# Patient Record
Sex: Female | Born: 1940 | Race: White | Hispanic: No | Smoking: Never smoker
Health system: Southern US, Community
[De-identification: ages and names within clinical notes are randomized; demographics above are authoritative.]

## PROBLEM LIST (undated history)

## (undated) DIAGNOSIS — G709 Myoneural disorder, unspecified: Secondary | ICD-10-CM

## (undated) DIAGNOSIS — Z87440 Personal history of urinary (tract) infections: Secondary | ICD-10-CM

## (undated) DIAGNOSIS — H269 Unspecified cataract: Secondary | ICD-10-CM

## (undated) DIAGNOSIS — Z9889 Other specified postprocedural states: Secondary | ICD-10-CM

## (undated) DIAGNOSIS — I10 Essential (primary) hypertension: Secondary | ICD-10-CM

## (undated) DIAGNOSIS — Z8639 Personal history of other endocrine, nutritional and metabolic disease: Secondary | ICD-10-CM

## (undated) DIAGNOSIS — Z889 Allergy status to unspecified drugs, medicaments and biological substances status: Secondary | ICD-10-CM

## (undated) DIAGNOSIS — M1990 Unspecified osteoarthritis, unspecified site: Secondary | ICD-10-CM

## (undated) DIAGNOSIS — E785 Hyperlipidemia, unspecified: Secondary | ICD-10-CM

## (undated) DIAGNOSIS — Z9289 Personal history of other medical treatment: Secondary | ICD-10-CM

## (undated) DIAGNOSIS — K219 Gastro-esophageal reflux disease without esophagitis: Secondary | ICD-10-CM

## (undated) DIAGNOSIS — R112 Nausea with vomiting, unspecified: Secondary | ICD-10-CM

## (undated) DIAGNOSIS — T7840XA Allergy, unspecified, initial encounter: Secondary | ICD-10-CM

## (undated) DIAGNOSIS — Z87898 Personal history of other specified conditions: Secondary | ICD-10-CM

## (undated) HISTORY — DX: Allergy status to unspecified drugs, medicaments and biological substances: Z88.9

## (undated) HISTORY — DX: Unspecified cataract: H26.9

## (undated) HISTORY — DX: Myoneural disorder, unspecified: G70.9

## (undated) HISTORY — DX: Personal history of other endocrine, nutritional and metabolic disease: Z86.39

## (undated) HISTORY — DX: Essential (primary) hypertension: I10

## (undated) HISTORY — DX: Unspecified osteoarthritis, unspecified site: M19.90

## (undated) HISTORY — PX: TUBAL LIGATION: SHX77

## (undated) HISTORY — PX: MICRODISCECTOMY LUMBAR: SUR864

## (undated) HISTORY — DX: Gastro-esophageal reflux disease without esophagitis: K21.9

## (undated) HISTORY — PX: CARPAL TUNNEL RELEASE: SHX101

## (undated) HISTORY — PX: CATARACT EXTRACTION, BILATERAL: SHX1313

## (undated) HISTORY — PX: SPINE SURGERY: SHX786

## (undated) HISTORY — DX: Personal history of urinary (tract) infections: Z87.440

## (undated) HISTORY — PX: EYE SURGERY: SHX253

## (undated) HISTORY — DX: Hyperlipidemia, unspecified: E78.5

## (undated) HISTORY — DX: Personal history of other medical treatment: Z92.89

## (undated) HISTORY — PX: JOINT REPLACEMENT: SHX530

## (undated) HISTORY — DX: Allergy, unspecified, initial encounter: T78.40XA

## (undated) HISTORY — DX: Personal history of other specified conditions: Z87.898

## (undated) HISTORY — PX: REMOVAL OF URINARY SLING: SHX6218

---

## 2013-07-15 DIAGNOSIS — Z85828 Personal history of other malignant neoplasm of skin: Secondary | ICD-10-CM

## 2013-07-15 HISTORY — DX: Personal history of other malignant neoplasm of skin: Z85.828

## 2015-07-16 DIAGNOSIS — Z85828 Personal history of other malignant neoplasm of skin: Secondary | ICD-10-CM

## 2015-07-16 HISTORY — DX: Personal history of other malignant neoplasm of skin: Z85.828

## 2017-07-15 HISTORY — PX: MICRODISCECTOMY LUMBAR: SUR864

## 2019-03-26 LAB — HM MAMMOGRAPHY

## 2019-07-26 ENCOUNTER — Ambulatory Visit: Payer: Medicare HMO | Admitting: Family Medicine

## 2019-07-26 ENCOUNTER — Encounter: Payer: Self-pay | Admitting: Family Medicine

## 2019-07-26 ENCOUNTER — Other Ambulatory Visit: Payer: Self-pay

## 2019-07-26 VITALS — BP 126/80 | HR 71 | Temp 97.5°F | Ht 63.0 in | Wt 181.4 lb

## 2019-07-26 DIAGNOSIS — E669 Obesity, unspecified: Secondary | ICD-10-CM | POA: Insufficient documentation

## 2019-07-26 DIAGNOSIS — G2581 Restless legs syndrome: Secondary | ICD-10-CM | POA: Insufficient documentation

## 2019-07-26 DIAGNOSIS — Z636 Dependent relative needing care at home: Secondary | ICD-10-CM | POA: Insufficient documentation

## 2019-07-26 DIAGNOSIS — K635 Polyp of colon: Secondary | ICD-10-CM | POA: Insufficient documentation

## 2019-07-26 DIAGNOSIS — I10 Essential (primary) hypertension: Secondary | ICD-10-CM | POA: Diagnosis not present

## 2019-07-26 DIAGNOSIS — K219 Gastro-esophageal reflux disease without esophagitis: Secondary | ICD-10-CM | POA: Insufficient documentation

## 2019-07-26 DIAGNOSIS — D352 Benign neoplasm of pituitary gland: Secondary | ICD-10-CM | POA: Diagnosis not present

## 2019-07-26 DIAGNOSIS — E7800 Pure hypercholesterolemia, unspecified: Secondary | ICD-10-CM | POA: Diagnosis not present

## 2019-07-26 DIAGNOSIS — D126 Benign neoplasm of colon, unspecified: Secondary | ICD-10-CM

## 2019-07-26 DIAGNOSIS — E785 Hyperlipidemia, unspecified: Secondary | ICD-10-CM | POA: Insufficient documentation

## 2019-07-26 DIAGNOSIS — R7989 Other specified abnormal findings of blood chemistry: Secondary | ICD-10-CM | POA: Insufficient documentation

## 2019-07-26 DIAGNOSIS — Z9889 Other specified postprocedural states: Secondary | ICD-10-CM | POA: Insufficient documentation

## 2019-07-26 DIAGNOSIS — J301 Allergic rhinitis due to pollen: Secondary | ICD-10-CM

## 2019-07-26 DIAGNOSIS — N3941 Urge incontinence: Secondary | ICD-10-CM | POA: Insufficient documentation

## 2019-07-26 DIAGNOSIS — Z85828 Personal history of other malignant neoplasm of skin: Secondary | ICD-10-CM

## 2019-07-26 DIAGNOSIS — J309 Allergic rhinitis, unspecified: Secondary | ICD-10-CM | POA: Insufficient documentation

## 2019-07-26 DIAGNOSIS — M8580 Other specified disorders of bone density and structure, unspecified site: Secondary | ICD-10-CM

## 2019-07-26 NOTE — Assessment & Plan Note (Signed)
Pt had ? Bladder suspension years ago  Is interested in discussing further options with a uro-gyn specialist She will call us when ready for a referral

## 2019-07-26 NOTE — Assessment & Plan Note (Signed)
Seasonal /environmental  Takes flonase when needed

## 2019-07-26 NOTE — Assessment & Plan Note (Signed)
In the past  Last colonoscopy 2014 - is overdue for 5 y recall  She is not ready to schedule that and will get back to [place]  Sent for last report  No bowel change per pt

## 2019-07-26 NOTE — Assessment & Plan Note (Signed)
Well controlled with generic protonix  Enc to eat a healthy diet

## 2019-07-26 NOTE — Assessment & Plan Note (Signed)
Discussed how this problem influences overall health and the risks it imposes  Reviewed plan for weight loss with lower calorie diet (via better food choices and also portion control or program like weight watchers) and exercise building up to or more than 30 minutes 5 days per week including some aerobic activity    

## 2019-07-26 NOTE — Assessment & Plan Note (Signed)
Sent for last lipid profile Takes crestor 10 mg daily  Makes effort re: low trans fat diet

## 2019-07-26 NOTE — Progress Notes (Signed)
Subjective:    Patient ID: Nicole Suarez, female    DOB: June 12, 1941, 79 y.o.   MRN: [id number]  This visit occurred during the SARS-CoV-2 public health emergency.  Safety protocols were in place, including screening questions prior to the visit, additional usage of staff PPE, and extensive cleaning of exam room while observing appropriate contact time as indicated for disinfecting solutions.    HPI New pt presents to establish care  She is Dr Nonda Lou mother- who moved to the area  Has a townhouse here -in [place] to move here permanently  Still has apartment in [place]  Has to care for her husband - he does not drive and has memory problems   She is a retired Therapist, sports    Husband is rehab from surgery- wants to be able to walk more   Wt Readings from Last 3 Encounters:  07/26/19 181 lb 6 oz (82.3 kg)   32.13 kg/m    Medical history:   Arthritis Has had total knee replacement on the L  Also rotator cuff tear -both shoulders   Deg disc disease Had discectomy 2/20 of L3-4   Uterine suspension surg 1971 No hysterectomy    GERD- takes generic of protonix   Allergies : - to pollen    HTN:  bp is stable today  No cp or palpitations or headaches or edema  No side effects to medicines  BP Readings from Last 3 Encounters:  07/26/19 126/80     Pulse Readings from Last 3 Encounters:  07/26/19 71     Hyperlipidemia - mild  Taking crestor 10 mg  Last lab work done in [place]   Urine incontinence-ongoing (urge incont) Was suggested she see a uro gyn   (daughter gave her a name)   Had uti recently -tx with bactrim  She drinks lots of water  One coffee per day   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 Microadenoma of pituitary) Watches out for that  On parlodel   RLS- intol of requip (affected vision)   Family history:  Cancer  Father had lung cancer - died 60  -heavy smoker  Mother died in child birth with her  Oldest  sister-pancreatic cancer (also heavy etoh)  53 brother had heart issues  Half sister had bladder cancer    Health mt  Had physical in Sept . Colonoscopy 2014-supposed to have every 5 years (will consider this spring)  Had a polyp years ago   Tetanus shot 2014 Pap was last 2018 - gyn  G4P3  LMP [place] in 9/20 -normal  Self breast exam -no lumps   Had both prevnar and pna 25  Td 7/14  Flu shot 2020-this season  dexa 9/19 - ostepenia  Had the shingrix series   Has eye exam next mo (has cataracts recently)   Health habits : Uses a stationary bike  Likes to walk  Takes ca and D for her bones   Has had Moh's procedure on face for skin ca  ? What type -perhaps squamous   Patient Active Problem List   Diagnosis Date Noted  . GERD (gastroesophageal reflux disease) 07/26/2019  . Essential hypertension 07/26/2019  . Hyperlipidemia 07/26/2019  . Allergic rhinitis 07/26/2019  . Caregiver stress 07/26/2019  . Urge incontinence 07/26/2019  . 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 RLS (restless legs syndrome) 07/26/2019  . Colon polyp 07/26/2019  . Osteopenia 07/26/2019  . History  of Mohs micrographic surgery for skin cancer 07/26/2019  . Obesity (BMI 30-39.9) 07/26/2019   Past Medical History:  Diagnosis Date  . Arthritis   . GERD (gastroesophageal reflux disease)   . History of allergy   . History of blood transfusion   . 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 History of urinary incontinence   . History of UTI     Social History   Tobacco Use  . Smoking status: Never Smoker  . Smokeless tobacco: Never Used  Substance Use Topics  . Alcohol use: Never  . Drug use: Not Currently   Family History  Problem Relation Age of Onset  . Early death Mother   . Cancer Father   . COPD Father   . Lung cancer Father   . Pancreatic cancer Sister   . Alcohol abuse Brother   . Heart disease Brother   .  Hypertension Brother   . COPD Maternal Grandmother    Allergies  Allergen Reactions  . Ace Inhibitors Other (See Comments)    THROAT SWELLING  . Requip [Ropinirole]     Vision change   . Shrimp [Shellfish Allergy] Nausea And Vomiting  . Aleve [Naproxen] Rash   Current Outpatient Medications on File Prior to Visit  Medication Sig Dispense Refill  . bromocriptine (PARLODEL) 2.5 MG tablet Take 2.5 mg by mouth daily.    . Calcium Carbonate-Vit D-Min (CALCIUM 1200 PO) Take 1 capsule by mouth daily.    . carvedilol (COREG) 12.5 MG tablet Take 12.5 mg by mouth 2 (two) times daily with a meal.    . chlorthalidone (HYGROTON) 25 MG tablet Take 12.5 mg by mouth daily.    . fluticasone (FLONASE) 50 MCG/ACT nasal spray Place 1-2 sprays into both nostrils daily.    Marland Kitchen ibuprofen (ADVIL) 400 MG tablet Take 400 mg by mouth every 8 (eight) hours as needed.    . Multiple Vitamin (MULTIVITAMIN) capsule Take 1 capsule by mouth daily.    . Multiple Vitamins-Minerals (PRESERVISION AREDS 2 PO) Take 2 capsules by mouth daily.    . NON FORMULARY SEVINA: 2 tablets daily    . Pantoprazole Sodium (PROTONIX PO) Take 40 mg by mouth daily.    . rosuvastatin (CRESTOR) 10 MG tablet Take 10 mg by mouth at bedtime.    Marland Kitchen VITAMIN D PO Take 1 capsule by mouth daily.     No current facility-administered medications on file prior to visit.     Review of Systems  Constitutional: Negative for activity change, appetite change, fatigue, fever and unexpected weight change.  HENT: Negative for congestion, ear pain, rhinorrhea, sinus pressure and sore throat.   Eyes: Negative for pain, redness and visual disturbance.  Respiratory: Negative for cough, shortness of breath and wheezing.   Cardiovascular: Negative for chest pain and palpitations.  Gastrointestinal: Negative for abdominal pain, blood in stool, constipation and diarrhea.  Endocrine: Negative for polydipsia and polyuria.  Genitourinary: Negative for dysuria,  frequency and urgency.  Musculoskeletal: Positive for back pain. Negative for arthralgias and myalgias.  Skin: Negative for pallor and rash.  Allergic/Immunologic: Negative for environmental allergies.  Neurological: Negative for dizziness, syncope and headaches.       Restless leg syndrome   Hematological: Negative for adenopathy. Does not bruise/bleed easily.  Psychiatric/Behavioral: Negative for decreased concentration and dysphoric mood. The patient is not nervous/anxious.        Caregiver stress- husband        Objective:   Physical  Exam Constitutional:      General: She is not in acute distress.    Appearance: Normal appearance. She is well-developed. She is obese. She is not ill-appearing or diaphoretic.  HENT:     Head: Normocephalic and atraumatic.     Right Ear: Tympanic membrane and ear canal normal.     Left Ear: Tympanic membrane and ear canal normal.     Mouth/Throat:     Mouth: Mucous membranes are moist.  Eyes:     General: No scleral icterus.    Conjunctiva/sclera: Conjunctivae normal.     Pupils: Pupils are equal, round, and reactive to light.  Neck:     Thyroid: No thyromegaly.     Vascular: No carotid bruit or JVD.  Cardiovascular:     Rate and Rhythm: Normal rate and regular rhythm.     Pulses: Normal pulses.     Heart sounds: Normal heart sounds. No gallop.   Pulmonary:     Effort: Pulmonary effort is normal. No respiratory distress.     Breath sounds: Normal breath sounds. No wheezing or rales.  Abdominal:     General: Bowel sounds are normal. There is no distension or abdominal bruit.     Palpations: Abdomen is soft. There is no mass.     Tenderness: There is no abdominal tenderness.  Musculoskeletal:     Cervical back: Normal range of motion and neck supple. No tenderness.     Right lower leg: No edema.     Left lower leg: No edema.  Lymphadenopathy:     Cervical: No cervical adenopathy.  Skin:    General: Skin is warm and dry.     Coloration:  Skin is not pale.     Findings: No erythema or rash.     Comments: Some lentigines and sks  Neurological:     Mental Status: She is alert.     Cranial Nerves: No cranial nerve deficit.     Motor: No weakness.     Coordination: Coordination normal.     Gait: Gait normal.     Deep Tendon Reflexes: Reflexes are normal and symmetric. Reflexes normal.  Psychiatric:        Mood and Affect: Mood normal.        Cognition and Memory: Cognition and memory normal.           Assessment & Plan:   Problem List Items Addressed This Visit      Cardiovascular and Mediastinum   Essential hypertension - Primary    bp in fair control at this time  BP Readings from Last 1 Encounters:  07/26/19 126/80   No changes needed Most recent labs reviewed  Disc lifstyle change with low sodium diet and exercise  Taking chlorthalidone and carvedilol  Will rev last labs when we get them        Relevant Medications   carvedilol (COREG) 12.5 MG tablet   rosuvastatin (CRESTOR) 10 MG tablet   chlorthalidone (HYGROTON) 25 MG tablet     Respiratory   Allergic rhinitis    Seasonal /environmental  Takes flonase when needed        Digestive   GERD (gastroesophageal reflux disease)    Well controlled with generic protonix  Enc to eat a healthy diet       Relevant Medications   Pantoprazole Sodium (PROTONIX PO)   Colon polyp    In the past  Last colonoscopy 2014 - is overdue for 5 y recall  She is  not ready to schedule that and will get back to [place]  Sent for last report  No bowel change per pt         Endocrine   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)  Takes parlodel 2.5 mg daily and no problems  Last lab was in sept in [place]      RLS (restless legs syndrome)    Pt tried requip and had side eff of vision change Living with her symptoms now       History of Mohs micrographic surgery for skin cancer    Pt thinks it was squamous cell lesion  She watches sun exposure carefully now      Obesity (BMI 30-39.9)    Discussed how this problem influences overall health and the risks it imposes  Reviewed plan for weight loss with lower calorie diet (via better food choices and also portion control or program like weight watchers) and exercise building up to or more than 30 minutes 5 days per week including some aerobic activity

## 2019-07-26 NOTE — Assessment & Plan Note (Signed)
bp in fair control at this time  BP Readings from Last 1 Encounters:  07/26/19 126/80   No changes needed Most recent labs reviewed  Disc lifstyle change with low sodium diet and exercise  Taking chlorthalidone and carvedilol  Will rev last labs when we get them

## 2019-07-26 NOTE — Assessment & Plan Note (Signed)
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

## 2019-07-26 NOTE — Assessment & Plan Note (Signed)
Pt tried requip and had side eff of vision change Living with her symptoms now

## 2019-07-26 NOTE — Assessment & Plan Note (Signed)
Pt thinks it was squamous cell lesion  She watches sun exposure carefully now

## 2019-07-26 NOTE — Assessment & Plan Note (Signed)
Cares for disabled husband  She wishes she had more support from him and feels frustrated at times

## 2019-07-26 NOTE — Patient Instructions (Addendum)
Please bring in copies of dexa/ mammogram and colonoscopy  Take care of yourself and be careful   Let us know if you find a uro-gyn and a referral  Let us know when you want to schedule your colonoscopy   We will send for note/labs/immunization records

## 2019-07-26 NOTE — Assessment & Plan Note (Signed)
Sent for last dexa  No falls or fx  Counseled on adequate intake of vit D and ca as well as exercise

## 2019-11-01 ENCOUNTER — Encounter: Payer: Self-pay | Admitting: Family Medicine

## 2019-11-02 ENCOUNTER — Other Ambulatory Visit: Payer: Self-pay

## 2019-11-02 ENCOUNTER — Ambulatory Visit: Payer: Medicare HMO | Admitting: Family Medicine

## 2019-11-02 ENCOUNTER — Encounter: Payer: Self-pay | Admitting: Family Medicine

## 2019-11-02 VITALS — BP 146/88 | HR 89 | Temp 96.9°F | Ht 63.0 in | Wt 187.1 lb

## 2019-11-02 DIAGNOSIS — N3000 Acute cystitis without hematuria: Secondary | ICD-10-CM | POA: Diagnosis not present

## 2019-11-02 DIAGNOSIS — R350 Frequency of micturition: Secondary | ICD-10-CM | POA: Diagnosis not present

## 2019-11-02 LAB — POC URINALSYSI DIPSTICK (AUTOMATED)
Bilirubin, UA: NEGATIVE
Blood, UA: 80
Glucose, UA: NEGATIVE
Ketones, UA: NEGATIVE
Nitrite, UA: POSITIVE
Protein, UA: NEGATIVE
Spec Grav, UA: 1.02 (ref 1.010–1.025)
Urobilinogen, UA: 0.2 E.U./dL
pH, UA: 6 (ref 5.0–8.0)

## 2019-11-02 MED ORDER — CEPHALEXIN 500 MG PO CAPS: 500.0000 mg | ORAL_CAPSULE | Freq: Two times a day (BID) | ORAL | 0 refills | Status: DC

## 2019-11-02 NOTE — Assessment & Plan Note (Signed)
With urgency/freq and more incontinence than usual  Enc water intake Pos ua-cx pend  tx with keflex 500 mg bid  inst to alert if suddenly worsens  Will call to check in when cx returns Handout given

## 2019-11-02 NOTE — Patient Instructions (Addendum)
I think you have a uti  Continue to drink lots of water Take the generic keflex as directed   If symptoms suddenly worsen - please call   We will contact you when urine culture returns

## 2019-11-02 NOTE — Progress Notes (Signed)
Subjective:    Patient ID: Nicole Suarez, female    DOB: February 05, 1941, 79 y.o.   MRN: [id number]  This visit occurred during the SARS-CoV-2 public health emergency.  Safety protocols were in place, including screening questions prior to the visit, additional usage of staff PPE, and extensive cleaning of exam room while observing appropriate contact time as indicated for disinfecting solutions.    HPI Pt presents with urinary symptoms   Wt Readings from Last 3 Encounters:  11/02/19 187 lb 1 oz (84.9 kg)  07/26/19 181 lb 6 oz (82.3 kg)   33.14 kg/m   Pt has a h/o urge incontinence   Thinks she has a uti  More incontinence and frequency of urination for the past several days  Going through a lot of pads   No discomfort to urinate  Some discomfort in bladder area  No blood in urine   No odor  No fever  occ nausea- ? 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 Acute cystitis 11/02/2019  . GERD (gastroesophageal reflux disease) 07/26/2019  . Essential hypertension 07/26/2019  . Hyperlipidemia 07/26/2019  . Allergic rhinitis 07/26/2019  . Caregiver stress 07/26/2019  . Urge incontinence 07/26/2019  . 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 RLS (restless legs syndrome) 07/26/2019  . Colon polyp 07/26/2019  . Osteopenia 07/26/2019  . History  of Mohs micrographic surgery for skin cancer 07/26/2019  . Obesity (BMI 30-39.9) 07/26/2019   Past Medical History:  Diagnosis Date  . Arthritis   . GERD (gastroesophageal reflux disease)   . History of allergy   . History of blood transfusion   . 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 History of urinary incontinence   . History of UTI    Past Surgical History:  Procedure Laterality Date  . APPENDECTOMY  1963  . [place]  . RECTOCELE REPAIR  2009  . REPLACEMENT TOTAL KNEE  2010  . ROTATOR CUFF REPAIR  2000  . urterine suspension  1971   Social History   Tobacco Use  . Smoking status: Never Smoker  . Smokeless tobacco: Never Used  Substance Use Topics  . Alcohol use: Never  . Drug use: Not Currently   Family History  Problem Relation Age of Onset  . Early death Mother   . Cancer Father   . COPD Father   . Lung cancer Father   . Pancreatic cancer Sister   . Alcohol abuse Brother   . Heart disease Brother   . Hypertension Brother   . COPD Maternal Grandmother    Allergies  Allergen Reactions  . Ace Inhibitors Other (  See Comments)    THROAT SWELLING  . Requip [Ropinirole]     Vision change   . Shrimp [Shellfish Allergy] Nausea And Vomiting  . Aleve [Naproxen] Rash   Current Outpatient Medications on File Prior to Visit  Medication Sig Dispense Refill  . bromocriptine (PARLODEL) 2.5 MG tablet Take 2.5 mg by mouth daily.    . Calcium Carbonate-Vit D-Min (CALCIUM 1200 PO) Take 1 capsule by mouth daily.    . carvedilol (COREG) 12.5 MG tablet Take 12.5 mg by mouth 2 (two) times daily with a meal.    . chlorthalidone (HYGROTON) 25 MG tablet Take 12.5 mg by mouth daily.    . fluticasone (FLONASE) 50 MCG/ACT nasal spray Place 1-2 sprays into both nostrils daily.    Marland Kitchen ibuprofen (ADVIL) 400 MG tablet Take 400 mg by mouth every 8 (eight) hours as needed.    Marland Kitchen levocetirizine (XYZAL) 5 MG tablet Take 5 mg by mouth every  evening.    . Multiple Vitamin (MULTIVITAMIN) capsule Take 1 capsule by mouth daily.    . Multiple Vitamins-Minerals (PRESERVISION AREDS 2 PO) Take 2 capsules by mouth daily.    . NON FORMULARY SEVINA: 2 tablets daily    . Pantoprazole Sodium (PROTONIX PO) Take 40 mg by mouth daily.    . rosuvastatin (CRESTOR) 10 MG tablet Take 10 mg by mouth at bedtime.    Marland Kitchen VITAMIN D PO Take 1 capsule by mouth daily.     No current facility-administered medications on file prior to visit.    Review of Systems  Constitutional: Positive for fatigue. Negative for activity change, appetite change and fever.  HENT: Negative for congestion and sore throat.   Eyes: Negative for itching and visual disturbance.  Respiratory: Negative for cough and shortness of breath.   Cardiovascular: Negative for leg swelling.  Gastrointestinal: Negative for abdominal distention, abdominal pain, constipation, diarrhea and nausea.  Endocrine: Negative for cold intolerance and polydipsia.  Genitourinary: Positive for frequency and urgency. Negative for difficulty urinating, dysuria, flank pain and hematuria.  Musculoskeletal: Negative for myalgias.  Skin: Negative for rash.  Allergic/Immunologic: Negative for immunocompromised state.  Neurological: Negative for dizziness and weakness.  Hematological: Negative for adenopathy.       Objective:   Physical Exam Constitutional:      General: She is not in acute distress.    Appearance: Normal appearance. She is well-developed. She is obese. She is not ill-appearing.  HENT:     Head: Normocephalic and atraumatic.  Eyes:     Conjunctiva/sclera: Conjunctivae normal.     Pupils: Pupils are equal, round, and reactive to light.  Cardiovascular:     Rate and Rhythm: Normal rate and regular rhythm.     Heart sounds: Normal heart sounds.  Pulmonary:     Effort: Pulmonary effort is normal.     Breath sounds: Normal breath sounds.  Abdominal:     General: Bowel sounds are  normal. There is no distension.     Palpations: Abdomen is soft.     Tenderness: There is abdominal tenderness. There is no right CVA tenderness or left CVA tenderness.     Comments: No cva tenderness  No suprapubic tenderness or fullness    Musculoskeletal:     Cervical back: Normal range of motion and neck supple.  Lymphadenopathy:     Cervical: No cervical adenopathy.  Skin:    Findings: No rash.  Neurological:     Mental Status: She is alert.  Psychiatric:  Mood and Affect: Mood normal.           Assessment & Plan:   Problem List Items Addressed This Visit      Genitourinary   Acute cystitis - Primary    With urgency/freq and more incontinence than usual  Enc water intake Pos ua-cx pend  tx with keflex 500 mg bid  inst to alert if suddenly worsens  Will call to check in when cx returns Handout given      Relevant Orders   Urine Culture    Other Visit Diagnoses    Urinary frequency       Relevant Orders   POCT Urinalysis Dipstick (Automated) (Completed)

## 2019-11-04 ENCOUNTER — Telehealth: Payer: Self-pay | Admitting: Family Medicine

## 2019-11-04 ENCOUNTER — Other Ambulatory Visit: Payer: Self-pay | Admitting: Family Medicine

## 2019-11-04 LAB — URINE CULTURE
MICRO NUMBER:: 10384407
SPECIMEN QUALITY:: ADEQUATE

## 2019-11-04 MED ORDER — SULFAMETHOXAZOLE-TRIMETHOPRIM 800-160 MG PO TABS: 1.0000 | ORAL_TABLET | Freq: Two times a day (BID) | ORAL | 0 refills | Status: DC

## 2019-11-04 NOTE — Telephone Encounter (Signed)
uti looks resistant to keflex unfortunately  Please send this bactrim to the pharmacy of choice  Let us know if no clinical improvement with this

## 2019-11-04 NOTE — Telephone Encounter (Signed)
Pt called in and said she had a missed call and she wants to know if it is about her urine results. Please call patient back regarding results.

## 2019-11-04 NOTE — Telephone Encounter (Signed)
Pt advised of urine cx and to stop the keflex and start the bactrim, pt verbalized understanding

## 2019-11-04 NOTE — Telephone Encounter (Signed)
Called pt and no answer and pt's VM box is full 

## 2019-11-04 NOTE — Telephone Encounter (Signed)
Addressed with pt

## 2019-11-11 ENCOUNTER — Encounter: Payer: Self-pay | Admitting: Podiatry

## 2019-11-11 ENCOUNTER — Ambulatory Visit: Payer: Medicare HMO | Admitting: Podiatry

## 2019-11-11 ENCOUNTER — Other Ambulatory Visit: Payer: Self-pay | Admitting: Podiatry

## 2019-11-11 ENCOUNTER — Ambulatory Visit (INDEPENDENT_AMBULATORY_CARE_PROVIDER_SITE_OTHER): Payer: Medicare HMO

## 2019-11-11 ENCOUNTER — Other Ambulatory Visit: Payer: Self-pay

## 2019-11-11 VITALS — BP 158/83 | HR 77 | Temp 96.2°F

## 2019-11-11 DIAGNOSIS — Q828 Other specified congenital malformations of skin: Secondary | ICD-10-CM

## 2019-11-11 DIAGNOSIS — M129 Arthropathy, unspecified: Secondary | ICD-10-CM | POA: Diagnosis not present

## 2019-11-11 DIAGNOSIS — G609 Hereditary and idiopathic neuropathy, unspecified: Secondary | ICD-10-CM | POA: Diagnosis not present

## 2019-11-11 MED ORDER — MELOXICAM 7.5 MG PO TABS: 7.5000 mg | ORAL_TABLET | Freq: Every day | ORAL | 0 refills | Status: DC

## 2019-11-11 NOTE — Progress Notes (Signed)
This patient presents to the office experiencing pain in her feet especially during sleep.   She says this pain has been present for about  One year and the pain is worsening. She says the pain is present on top of her midfoot both feet. She also says she has a painful callus on the outside of her right forefoot. She says she has moved from [place] to [facility] recently.    She presents to the office for evaluation and treatment of her feet.     Vascular  Dorsalis pedis and posterior tibial pulses are palpable  B/L.  Capillary return  WNL.  Temperature gradient is  WNL.  Skin turgor  WNL  Sensorium  Senn Weinstein monofilament wire  WNL. Normal tactile sensation.  Nail Exam  Patient has normal nails with no evidence of bacterial or fungal infection.  Orthopedic  Exam  Muscle tone and muscle strength  WNL.  No limitations of motion feet  B/L.  No crepitus or joint effusion noted.  Foot type is unremarkable and digits show no abnormalities.  Bony exostosis noted on the dorsal aspect midfoot  B/L.  No redness or swelling noted.    Skin  No open lesions.  Normal skin texture and turgor. Porokeratosis lateral plantar aspect 5th MPJ  Right  Chronic arthritis  Midfoot  B/L   Porokeratosis right nfoot.  IE.  Xrays reveal arthritic formation midfoot  B/L.  Discussed this condition with this patient.  Recommended powerstep insoles to be worn in thick soled shoes.  Prescribe Mobic  7.5  Mg .  RTC prn.  RTC 3 months for treatment of porokeratosis right foot.  Gardiner Barefoot DPM

## 2019-12-03 ENCOUNTER — Other Ambulatory Visit: Payer: Self-pay | Admitting: Podiatry

## 2019-12-03 DIAGNOSIS — M129 Arthropathy, unspecified: Secondary | ICD-10-CM

## 2020-01-11 ENCOUNTER — Encounter: Payer: Self-pay | Admitting: Family Medicine

## 2020-01-12 NOTE — Telephone Encounter (Signed)
Patient left a voicemail stating that she has a cough and wants to know what Dr. Glori Bickers recommends. See mychart message from patient.

## 2020-01-13 ENCOUNTER — Encounter: Payer: Self-pay | Admitting: Family Medicine

## 2020-01-13 ENCOUNTER — Telehealth (INDEPENDENT_AMBULATORY_CARE_PROVIDER_SITE_OTHER): Payer: Medicare HMO | Admitting: Family Medicine

## 2020-01-13 DIAGNOSIS — J208 Acute bronchitis due to other specified organisms: Secondary | ICD-10-CM | POA: Insufficient documentation

## 2020-01-13 MED ORDER — GUAIFENESIN-CODEINE 100-10 MG/5ML PO SYRP: 5.0000 mL | ORAL_SOLUTION | Freq: Four times a day (QID) | ORAL | 0 refills | Status: DC | PRN

## 2020-01-13 MED ORDER — PREDNISONE 10 MG PO TABS: ORAL_TABLET | ORAL | 0 refills | Status: DC

## 2020-01-13 NOTE — Progress Notes (Signed)
Virtual Visit via Video Note  I connected with Nicole Suarez on 01/13/20 at 10:30 AM EDT by a video enabled telemedicine application and verified that I am speaking with the correct person using two identifiers.  Location: Patient: home Provider: office   I discussed the limitations of evaluation and management by telemedicine and the availability of in person appointments. The patient expressed understanding and agreed to proceed.  Parties involved in encounter  Patient:Nicole Suarez  Provider:  Loura Pardon MD     History of Present Illness: Pt presents with cough and ST for a week  It was pretty bad- improved by Friday and then changed to cough  Had a lot of house guests as well  Almost all of them got similar symptoms   (some tested neg for covid)   Cough is productive but hard to get mucous out  No sob but hears herself wheezing  Cough lingers now  Phlegm goes down throat-? Color   Checks 02 sat 97-98%  Temp 97.2    No head congestion  No fever  No loss of taste  No rash   Otc: Delsym Cough drops   She is vaccinated for covid 19  Patient Active Problem List   Diagnosis Date Noted  . Acute viral bronchitis 01/13/2020  . 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  . Acute cystitis 11/02/2019  . GERD (gastroesophageal reflux disease) 07/26/2019  . Essential hypertension 07/26/2019  . Hyperlipidemia 07/26/2019  . Allergic rhinitis 07/26/2019  . Caregiver stress 07/26/2019  . Urge incontinence 07/26/2019  . 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 RLS (restless legs syndrome) 07/26/2019  . Colon polyp 07/26/2019  . Osteopenia 07/26/2019  . History of Mohs micrographic surgery for skin cancer 07/26/2019  . Obesity (BMI 30-39.9) 07/26/2019   Past Medical History:  Diagnosis Date  . Arthritis   . GERD (gastroesophageal reflux disease)   . History of allergy   . History of blood transfusion   . 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 History of urinary incontinence   . History of UTI    Past Surgical History:  Procedure Laterality Date  . APPENDECTOMY  1963  . [place]  . RECTOCELE REPAIR  2009  . REPLACEMENT TOTAL KNEE  2010  . ROTATOR CUFF REPAIR  2000  . urterine suspension  1971   Social History   Tobacco Use  . Smoking status: Never Smoker  . Smokeless tobacco: Never Used  Substance Use Topics  . Alcohol use: Never  . Drug use: Not Currently   Family History  Problem Relation Age of Onset  . Early death Mother   . Cancer Father   . COPD Father   . Lung cancer Father   . Pancreatic cancer Sister   . Alcohol abuse Brother   . Heart disease Brother   . Hypertension Brother   . COPD Maternal Grandmother    Allergies  Allergen Reactions  . Ace Inhibitors Other (See Comments)    THROAT SWELLING  . Requip [Ropinirole]     Vision change   . Shrimp [Shellfish Allergy] Nausea And Vomiting  . Aleve [Naproxen] Rash   Current Outpatient Medications on File Prior to Visit  Medication Sig Dispense Refill  . bromocriptine (PARLODEL) 2.5 MG tablet Take 2.5 mg by mouth daily.    . Calcium Carbonate-Vit D-Min (CALCIUM 1200 PO) Take 1 capsule by mouth daily.    . carvedilol (COREG) 12.5 MG tablet  Take 12.5 mg by mouth 2 (two) times daily with a meal.    . chlorthalidone (HYGROTON) 25 MG tablet Take 12.5 mg by mouth daily.    . fluticasone (FLONASE) 50 MCG/ACT nasal spray Place 1-2 sprays into both nostrils daily.    Marland Kitchen ibuprofen (ADVIL) 400 MG tablet Take 400 mg by mouth every 8 (eight) hours as needed.    Marland Kitchen levocetirizine (XYZAL) 5 MG tablet Take 5 mg by mouth every evening.    . meloxicam (MOBIC) 7.5 MG tablet TAKE 1 TABLET BY MOUTH EVERY DAY 30 tablet 1  . Multiple Vitamin (MULTIVITAMIN) capsule Take 1 capsule by mouth daily.    . Multiple Vitamins-Minerals (PRESERVISION AREDS 2 PO) Take 2 capsules by mouth daily.    Marland Kitchen  neomycin-polymyxin b-dexamethasone (MAXITROL) 3.5-10000-0.1 SUSP Use as directed    . NON FORMULARY SEVINA: 2 tablets daily    . Pantoprazole Sodium (PROTONIX PO) Take 40 mg by mouth daily.    . rosuvastatin (CRESTOR) 10 MG tablet Take 10 mg by mouth at bedtime.    . sulfamethoxazole-trimethoprim (BACTRIM DS) 800-160 MG tablet Take 1 tablet by mouth 2 (two) times daily. 14 tablet 0  . VITAMIN D PO Take 1 capsule by mouth daily.     No current facility-administered medications on file prior to visit.   Review of Systems  Constitutional: Positive for malaise/fatigue. Negative for chills and fever.  HENT: Negative for congestion, ear pain, sinus pain and sore throat.   Eyes: Negative for blurred vision, discharge and redness.  Respiratory: Positive for cough, sputum production and wheezing. Negative for shortness of breath and stridor.   Cardiovascular: Negative for chest pain, palpitations and leg swelling.  Gastrointestinal: Negative for abdominal pain, diarrhea, nausea and vomiting.  Musculoskeletal: Negative for myalgias.  Skin: Negative for rash.  Neurological: Negative for dizziness and headaches.      Observations/Objective: Patient appears well, in no distress Weight is baseline  No facial swelling or asymmetry Voice is mildly hoarse No obvious tremor or mobility impairment Moving neck and UEs normally Able to hear the call well  Frequent wheezy sounding cough (no sob or wheezing in between cough) Talkative and mentally sharp with no cognitive changes No skin changes on face or neck , no rash or pallor Affect is normal    Assessment and Plan: Problem List Items Addressed This Visit      Respiratory   Acute viral bronchitis    She and family all got this at same time  Is covid immunized and others all had neg covid tests  For wheezy/tight cough- px prednisone 30 mg taper and disc side eff guaifen-AC for cough with caution of sedation Fluids Rest  sympt care Update if  not starting to improve in a week or if worsening  Also if any new symptoms like fever or change in taste and smell          Follow Up Instructions: Take prednisone as directed for cough/wheeze and tight chest  guaifen- codeine syrup as needed for cough with caution of sedation   Drink fluids and rest  Update if not starting to improve in a week or if worsening   Also if new symptoms like fever or taste/smell change   I discussed the assessment and treatment plan with the patient. The patient was provided an opportunity to ask questions and all were answered. The patient agreed with the plan and demonstrated an understanding of the instructions.   The patient was advised to  call back or seek an in-person evaluation if the symptoms worsen or if the condition fails to improve as anticipated.     Nicole Pardon, MD

## 2020-01-13 NOTE — Assessment & Plan Note (Signed)
She and family all got this at same time  Is covid immunized and others all had neg covid tests  For wheezy/tight cough- px prednisone 30 mg taper and disc side eff guaifen-AC for cough with caution of sedation Fluids Rest  sympt care Update if not starting to improve in a week or if worsening  Also if any new symptoms like fever or change in taste and smell

## 2020-01-13 NOTE — Patient Instructions (Signed)
Take prednisone as directed for cough/wheeze and tight chest  guaifen- codeine syrup as needed for cough with caution of sedation   Drink fluids and rest  Update if not starting to improve in a week or if worsening   Also if new symptoms like fever or taste/smell change

## 2020-01-23 ENCOUNTER — Encounter: Payer: Self-pay | Admitting: Family Medicine

## 2020-01-24 MED ORDER — CARVEDILOL 12.5 MG PO TABS: 12.5000 mg | ORAL_TABLET | Freq: Two times a day (BID) | ORAL | 0 refills | Status: DC

## 2020-01-24 MED ORDER — BROMOCRIPTINE MESYLATE 2.5 MG PO TABS: 2.5000 mg | ORAL_TABLET | Freq: Every day | ORAL | 0 refills | Status: DC

## 2020-01-24 MED ORDER — CHLORTHALIDONE 25 MG PO TABS: 12.5000 mg | ORAL_TABLET | Freq: Every day | ORAL | 0 refills | Status: DC

## 2020-01-24 MED ORDER — ROSUVASTATIN CALCIUM 10 MG PO TABS: 10.0000 mg | ORAL_TABLET | Freq: Every day | ORAL | 0 refills | Status: DC

## 2020-02-10 ENCOUNTER — Ambulatory Visit: Payer: Medicare HMO | Admitting: Podiatry

## 2020-04-12 ENCOUNTER — Encounter: Payer: Medicare HMO | Admitting: Family Medicine

## 2020-04-19 ENCOUNTER — Telehealth: Payer: Self-pay | Admitting: Family Medicine

## 2020-04-19 DIAGNOSIS — E7800 Pure hypercholesterolemia, unspecified: Secondary | ICD-10-CM

## 2020-04-19 DIAGNOSIS — I10 Essential (primary) hypertension: Secondary | ICD-10-CM

## 2020-04-19 NOTE — Telephone Encounter (Signed)
-----   Message from Cloyd Stagers, RT sent at 04/05/2020  1:06 PM EDT ----- Regarding: Lab Orders for Thursday 10.7.2021 Please place lab orders for Thursday 10.7.2021, office visit for physical on Wednesday 10.13.2021 Appt notes state the pt wants same day labs..? Thank you, Dyke Maes RT(R)

## 2020-04-20 ENCOUNTER — Ambulatory Visit: Payer: Medicare HMO

## 2020-04-20 ENCOUNTER — Other Ambulatory Visit: Payer: Self-pay

## 2020-04-20 ENCOUNTER — Other Ambulatory Visit (INDEPENDENT_AMBULATORY_CARE_PROVIDER_SITE_OTHER): Payer: Medicare HMO

## 2020-04-20 DIAGNOSIS — I10 Essential (primary) hypertension: Secondary | ICD-10-CM

## 2020-04-20 DIAGNOSIS — E7800 Pure hypercholesterolemia, unspecified: Secondary | ICD-10-CM | POA: Diagnosis not present

## 2020-04-20 LAB — CBC WITH DIFFERENTIAL/PLATELET
Basophils Absolute: 0 10*3/uL (ref 0.0–0.1)
Basophils Relative: 0.4 % (ref 0.0–3.0)
Eosinophils Absolute: 0.1 10*3/uL (ref 0.0–0.7)
Eosinophils Relative: 1.9 % (ref 0.0–5.0)
HCT: 39.1 % (ref 36.0–46.0)
Hemoglobin: 13.2 g/dL (ref 12.0–15.0)
Lymphocytes Relative: 37.2 % (ref 12.0–46.0)
Lymphs Abs: 2 10*3/uL (ref 0.7–4.0)
MCHC: 33.8 g/dL (ref 30.0–36.0)
MCV: 90.2 fl (ref 78.0–100.0)
Monocytes Absolute: 0.6 10*3/uL (ref 0.1–1.0)
Monocytes Relative: 10.1 % (ref 3.0–12.0)
Neutro Abs: 2.8 10*3/uL (ref 1.4–7.7)
Neutrophils Relative %: 50.4 % (ref 43.0–77.0)
Platelets: 233 10*3/uL (ref 150.0–400.0)
RBC: 4.34 Mil/uL (ref 3.87–5.11)
RDW: 14.3 % (ref 11.5–15.5)
WBC: 5.5 10*3/uL (ref 4.0–10.5)

## 2020-04-20 LAB — COMPREHENSIVE METABOLIC PANEL
ALT: 20 U/L (ref 0–35)
AST: 20 U/L (ref 0–37)
Albumin: 4.2 g/dL (ref 3.5–5.2)
Alkaline Phosphatase: 65 U/L (ref 39–117)
BUN: 22 mg/dL (ref 6–23)
CO2: 30 mEq/L (ref 19–32)
Calcium: 9.2 mg/dL (ref 8.4–10.5)
Chloride: 101 mEq/L (ref 96–112)
Creatinine, Ser: 1.04 mg/dL (ref 0.40–1.20)
GFR: 51.77 mL/min — ABNORMAL LOW (ref >60.00)
Glucose, Bld: 102 mg/dL — ABNORMAL HIGH (ref 70–99)
Potassium: 3.9 mEq/L (ref 3.5–5.1)
Sodium: 139 mEq/L (ref 135–145)
Total Bilirubin: 0.5 mg/dL (ref 0.2–1.2)
Total Protein: 6.5 g/dL (ref 6.0–8.3)

## 2020-04-20 LAB — LIPID PANEL
Cholesterol: 166 mg/dL (ref 0–200)
HDL: 61.6 mg/dL (ref >39.00)
LDL Cholesterol: 85 mg/dL (ref 0–99)
NonHDL: 104.22
Total CHOL/HDL Ratio: 3
Triglycerides: 97 mg/dL (ref 0.0–149.0)
VLDL: 19.4 mg/dL (ref 0.0–40.0)

## 2020-04-20 LAB — TSH: TSH: 1.94 u[IU]/mL (ref 0.35–4.50)

## 2020-04-23 ENCOUNTER — Other Ambulatory Visit: Payer: Self-pay | Admitting: Family Medicine

## 2020-04-26 ENCOUNTER — Other Ambulatory Visit: Payer: Self-pay

## 2020-04-26 ENCOUNTER — Encounter: Payer: Self-pay | Admitting: Family Medicine

## 2020-04-26 ENCOUNTER — Other Ambulatory Visit: Payer: Self-pay | Admitting: Family Medicine

## 2020-04-26 ENCOUNTER — Ambulatory Visit (INDEPENDENT_AMBULATORY_CARE_PROVIDER_SITE_OTHER): Payer: Medicare HMO | Admitting: Family Medicine

## 2020-04-26 VITALS — BP 116/68 | HR 74 | Temp 96.9°F | Ht 63.0 in | Wt 186.1 lb

## 2020-04-26 DIAGNOSIS — Z0000 Encounter for general adult medical examination without abnormal findings: Secondary | ICD-10-CM | POA: Insufficient documentation

## 2020-04-26 DIAGNOSIS — R7303 Prediabetes: Secondary | ICD-10-CM | POA: Insufficient documentation

## 2020-04-26 DIAGNOSIS — M8580 Other specified disorders of bone density and structure, unspecified site: Secondary | ICD-10-CM

## 2020-04-26 DIAGNOSIS — Z1231 Encounter for screening mammogram for malignant neoplasm of breast: Secondary | ICD-10-CM

## 2020-04-26 DIAGNOSIS — D126 Benign neoplasm of colon, unspecified: Secondary | ICD-10-CM

## 2020-04-26 DIAGNOSIS — E2839 Other primary ovarian failure: Secondary | ICD-10-CM | POA: Insufficient documentation

## 2020-04-26 DIAGNOSIS — R7989 Other specified abnormal findings of blood chemistry: Secondary | ICD-10-CM

## 2020-04-26 DIAGNOSIS — Z85828 Personal history of other malignant neoplasm of skin: Secondary | ICD-10-CM

## 2020-04-26 DIAGNOSIS — R7309 Other abnormal glucose: Secondary | ICD-10-CM | POA: Insufficient documentation

## 2020-04-26 DIAGNOSIS — I10 Essential (primary) hypertension: Secondary | ICD-10-CM | POA: Diagnosis not present

## 2020-04-26 DIAGNOSIS — E7800 Pure hypercholesterolemia, unspecified: Secondary | ICD-10-CM

## 2020-04-26 DIAGNOSIS — E669 Obesity, unspecified: Secondary | ICD-10-CM

## 2020-04-26 DIAGNOSIS — Z9889 Other specified postprocedural states: Secondary | ICD-10-CM

## 2020-04-26 MED ORDER — CHLORTHALIDONE 25 MG PO TABS
12.5000 mg | ORAL_TABLET | Freq: Every day | ORAL | 3 refills | Status: DC
Start: 2020-04-26 — End: 2021-05-24

## 2020-04-26 MED ORDER — BROMOCRIPTINE MESYLATE 2.5 MG PO TABS
2.5000 mg | ORAL_TABLET | Freq: Every day | ORAL | 3 refills | Status: DC
Start: 2020-04-26 — End: 2021-04-23

## 2020-04-26 MED ORDER — ROSUVASTATIN CALCIUM 10 MG PO TABS
10.0000 mg | ORAL_TABLET | Freq: Every day | ORAL | 3 refills | Status: DC
Start: 2020-04-26 — End: 2021-05-24

## 2020-04-26 MED ORDER — CARVEDILOL 12.5 MG PO TABS
12.5000 mg | ORAL_TABLET | Freq: Two times a day (BID) | ORAL | 3 refills | Status: DC
Start: 2020-04-26 — End: 2021-05-24

## 2020-04-26 NOTE — Assessment & Plan Note (Signed)
Ordered colonoscopy for 5 y recall

## 2020-04-26 NOTE — Assessment & Plan Note (Signed)
Discussed how this problem influences overall health and the risks it imposes  Reviewed plan for weight loss with lower calorie diet (via better food choices and also portion control or program like weight watchers) and exercise building up to or more than 30 minutes 5 days per week including some aerobic activity    

## 2020-04-26 NOTE — Assessment & Plan Note (Signed)
Mammogram ordered and pt will schedule  Enc self breast exams

## 2020-04-26 NOTE — Assessment & Plan Note (Signed)
bp in fair control at this time  BP Readings from Last 1 Encounters:  04/26/20 116/68   No changes needed Most recent labs reviewed  Disc lifstyle change with low sodium diet and exercise  Taking carvedilol 12.5 mg  Chlorthalidone 25 mg

## 2020-04-26 NOTE — Assessment & Plan Note (Signed)
Disc goals for lipids and reasons to control them Rev last labs with pt Rev low sat fat diet in detail Good control with rosuvastatin  Diet is also improving

## 2020-04-26 NOTE — Assessment & Plan Note (Signed)
Mild   102 fasting  disc imp of low glycemic diet and wt loss to prevent DM2  Will do a1c with next labs

## 2020-04-26 NOTE — Assessment & Plan Note (Signed)
Screening dexa ordered   

## 2020-04-26 NOTE — Assessment & Plan Note (Signed)
Pt continues bromocriptine No symtoms  Will plan to check prolactin with next labs

## 2020-04-26 NOTE — Assessment & Plan Note (Signed)
dexa ordered No falls or fx  Disc ca and D intake and imp of exercise

## 2020-04-26 NOTE — Patient Instructions (Addendum)
Call the breast center to schedule your mammogram and bone density   The office will call you to set up a colonoscopy   To prevent diabetes  Try to get most of your carbohydrates from produce (with the exception of white potatoes)  Eat less bread/pasta/rice/snack foods/cereals/sweets and other items from the middle of the grocery store (processed carbs)  Take your calcium and vitamin D for bone health

## 2020-04-26 NOTE — Assessment & Plan Note (Signed)
Pt keeps regular derm appts for screening

## 2020-04-26 NOTE — Progress Notes (Signed)
Subjective:    Patient ID: Nicole Suarez, female    DOB: 1940-09-26, 79 y.o.   MRN: [id number]  This visit occurred during the SARS-CoV-2 public health emergency.  Safety protocols were in place, including screening questions prior to the visit, additional usage of staff PPE, and extensive cleaning of exam room while observing appropriate contact time as indicated for disinfecting solutions.    HPI Here for health maintenance exam and to review chronic medical problems    Pt has amw scheduled 11/12  Wt Readings from Last 3 Encounters:  04/26/20 186 lb 1 oz (84.4 kg)  01/13/20 183 lb (83 kg)  11/02/19 187 lb 1 oz (84.9 kg)   32.96 kg/m   She has bladder surgery for incontinence planned  Due for covid booster Friday   Just came back from [place]   (she has an apartment there)  Husband fell 4 times and ended up in the hospital  caregiving is stressful -but she is doing ok and trying to stay calm  Declines need for counselor at this point   Does not sleep very well / has to go to the bathroom a lot   Has daughter and neighbor to help out if needed  Has a medi alert system also   covid vaccinated [place] Had flu and pnv 23 vaccines at the pharmacy- will bring back dates /does not have with her   dexa- thinks it is due  No falls or fracture    Colon cancer screening  Per pt colonoscopy 2014 -5 y recall due to polyp    HTN bp is stable today  No cp or palpitations or headaches or edema  No side effects to medicines  BP Readings from Last 3 Encounters:  04/26/20 116/68  01/13/20 (!) 145/70  11/11/19 (!) 158/83    Taking carvedilol 12.5 mg and chlorthalidone 25 mg   Pulse Readings from Last 3 Encounters:  04/26/20 74  11/11/19 77  11/02/19 89    Hyperlipidemia Lab Results  Component Value Date   CHOL 166 04/20/2020   Lab Results  Component Value Date   HDL 61.60 04/20/2020   Lab Results  Component Value Date   LDLCALC 85 04/20/2020   Lab Results  Component  Value Date   TRIG 97.0 04/20/2020   Lab Results  Component Value Date   CHOLHDL 3 04/20/2020   No results found for: LDLDIRECT Taking rosuvastatin  Diet is good  Eating more salad    History of high prolactin- she takes bromocriptine   Other labs  Results for orders placed or performed in visit on 04/20/20  TSH  Result Value Ref Range   TSH 1.94 0.35 - 4.50 uIU/mL  Lipid panel  Result Value Ref Range   Cholesterol 166 0 - 200 mg/dL   Triglycerides 97.0 0 - 149 mg/dL   HDL 61.60 >39.00 mg/dL   VLDL 19.4 0.0 - 40.0 mg/dL   LDL Cholesterol 85 0 - 99 mg/dL   Total CHOL/HDL Ratio 3    NonHDL 104.22   Comprehensive metabolic panel  Result Value Ref Range   Sodium 139 135 - 145 mEq/L   Potassium 3.9 3.5 - 5.1 mEq/L   Chloride 101 96 - 112 mEq/L   CO2 30 19 - 32 mEq/L   Glucose, Bld 102 (H) 70 - 99 mg/dL   BUN 22 6 - 23 mg/dL   Creatinine, Ser 1.04 0.40 - 1.20 mg/dL   Total Bilirubin 0.5 0.2 - 1.2 mg/dL  Alkaline Phosphatase 65 39 - 117 U/L   AST 20 0 - 37 U/L   ALT 20 0 - 35 U/L   Total Protein 6.5 6.0 - 8.3 g/dL   Albumin 4.2 3.5 - 5.2 g/dL   GFR 51.77 (L) >60.00 mL/min   Calcium 9.2 8.4 - 10.5 mg/dL  CBC with Differential/Platelet  Result Value Ref Range   WBC 5.5 4.0 - 10.5 K/uL   RBC 4.34 3.87 - 5.11 Mil/uL   Hemoglobin 13.2 12.0 - 15.0 g/dL   HCT 39.1 36 - 46 %   MCV 90.2 78.0 - 100.0 fl   MCHC 33.8 30.0 - 36.0 g/dL   RDW 14.3 11.5 - 15.5 %   Platelets 233.0 150 - 400 K/uL   Neutrophils Relative % 50.4 43 - 77 %   Lymphocytes Relative 37.2 12 - 46 %   Monocytes Relative 10.1 3 - 12 %   Eosinophils Relative 1.9 0 - 5 %   Basophils Relative 0.4 0 - 3 %   Neutro Abs 2.8 1.4 - 7.7 K/uL   Lymphs Abs 2.0 0.7 - 4.0 K/uL   Monocytes Absolute 0.6 0.1 - 1.0 K/uL   Eosinophils Absolute 0.1 0.0 - 0.7 K/uL   Basophils Absolute 0.0 0.0 - 0.1 K/uL    Was fasting  Glucose 102  Some desserts   Less bread   Feels fine  Wants to let prolactin level wait  Patient  Active Problem List   Diagnosis Date Noted  . Routine general medical examination at a health care facility 04/26/2020  . Screening mammogram, encounter for 04/26/2020  . Estrogen deficiency 04/26/2020  . Elevated glucose level 04/26/2020  . 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  . GERD (gastroesophageal reflux disease) 07/26/2019  . Essential hypertension 07/26/2019  . Hyperlipidemia 07/26/2019  . Allergic rhinitis 07/26/2019  . Caregiver stress 07/26/2019  . Urge incontinence 07/26/2019  . 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 RLS (restless legs syndrome) 07/26/2019  . Colon polyp 07/26/2019  . Osteopenia 07/26/2019  . History of Mohs micrographic surgery for skin cancer 07/26/2019  . Obesity (BMI 30-39.9) 07/26/2019   Past Medical History:  Diagnosis Date  . Arthritis   . GERD (gastroesophageal reflux disease)   . History of allergy   . History of blood transfusion   . 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 History of urinary incontinence   . History of UTI    Past Surgical History:  Procedure Laterality Date  . APPENDECTOMY  1963  . [place]  . RECTOCELE REPAIR  2009  . REPLACEMENT TOTAL KNEE  2010  . ROTATOR CUFF REPAIR  2000  . urterine suspension  1971   Social History   Tobacco Use  . Smoking status: Never Smoker  . Smokeless tobacco: Never Used  Substance Use Topics  . Alcohol use: Never  . Drug use: Not Currently   Family History  Problem Relation Age of Onset  . Early death Mother   . Cancer Father   . COPD Father   . Lung cancer Father   . Pancreatic cancer Sister   . Alcohol abuse Brother   . Heart disease Brother   . Hypertension Brother   . COPD Maternal Grandmother    Allergies  Allergen Reactions  . Ace Inhibitors Other (See Comments)    THROAT SWELLING  . Requip [Ropinirole]     Vision change   . Shrimp [Shellfish Allergy] Nausea And  Vomiting  .  Aleve [Naproxen] Rash   Current Outpatient Medications on File Prior to Visit  Medication Sig Dispense Refill  . Calcium Carbonate-Vit D-Min (CALCIUM 1200 PO) Take 1 capsule by mouth daily.    . fluticasone (FLONASE) 50 MCG/ACT nasal spray Place 1-2 sprays into both nostrils daily.    Marland Kitchen ibuprofen (ADVIL) 400 MG tablet Take 400 mg by mouth every 8 (eight) hours as needed.    Marland Kitchen levocetirizine (XYZAL) 5 MG tablet Take 5 mg by mouth every evening.    . Multiple Vitamin (MULTIVITAMIN) capsule Take 1 capsule by mouth daily.    . Multiple Vitamins-Minerals (PRESERVISION AREDS 2 PO) Take 2 capsules by mouth daily.    . NON FORMULARY SEVINA: 2 tablets daily    . Pantoprazole Sodium (PROTONIX PO) Take 40 mg by mouth daily.    Marland Kitchen VITAMIN D PO Take 1 capsule by mouth daily.     No current facility-administered medications on file prior to visit.    Review of Systems  Constitutional: Negative for activity change, appetite change, fatigue, fever and unexpected weight change.  HENT: Negative for congestion, ear pain, rhinorrhea, sinus pressure and sore throat.   Eyes: Negative for pain, redness and visual disturbance.  Respiratory: Negative for cough, shortness of breath and wheezing.   Cardiovascular: Negative for chest pain and palpitations.  Gastrointestinal: Negative for abdominal pain, blood in stool, constipation and diarrhea.  Endocrine: Negative for polydipsia and polyuria.  Genitourinary: Negative for dysuria, frequency and urgency.  Musculoskeletal: Negative for arthralgias, back pain and myalgias.  Skin: Negative for pallor and rash.  Allergic/Immunologic: Negative for environmental allergies.  Neurological: Negative for dizziness, syncope and headaches.  Hematological: Negative for adenopathy. Does not bruise/bleed easily.  Psychiatric/Behavioral: Negative for decreased concentration and dysphoric mood. The patient is not nervous/anxious.        Caregiver stress       Objective:    Physical Exam Constitutional:      General: She is not in acute distress.    Appearance: Normal appearance. She is well-developed. She is obese. She is not ill-appearing or diaphoretic.  HENT:     Head: Normocephalic and atraumatic.     Right Ear: Tympanic membrane, ear canal and external ear normal.     Left Ear: Tympanic membrane, ear canal and external ear normal.     Nose: Nose normal. No congestion.     Mouth/Throat:     Mouth: Mucous membranes are moist.     Pharynx: Oropharynx is clear. No posterior oropharyngeal erythema.  Eyes:     General: No scleral icterus.    Extraocular Movements: Extraocular movements intact.     Conjunctiva/sclera: Conjunctivae normal.     Pupils: Pupils are equal, round, and reactive to light.  Neck:     Thyroid: No thyromegaly.     Vascular: No carotid bruit or JVD.  Cardiovascular:     Rate and Rhythm: Normal rate and regular rhythm.     Pulses: Normal pulses.     Heart sounds: Normal heart sounds. No gallop.   Pulmonary:     Effort: Pulmonary effort is normal. No respiratory distress.     Breath sounds: Normal breath sounds. No wheezing.     Comments: Good air exch Chest:     Chest wall: No tenderness.  Abdominal:     General: Bowel sounds are normal. There is no distension or abdominal bruit.     Palpations: Abdomen is soft. There is no mass.  Tenderness: There is no abdominal tenderness.     Hernia: No hernia is present.  Genitourinary:    Comments: Breast exam: No mass, nodules, thickening, tenderness, bulging, retraction, inflamation, nipple discharge or skin changes noted.  No axillary or clavicular LA.     Musculoskeletal:        General: No tenderness. Normal range of motion.     Cervical back: Normal range of motion and neck supple. No rigidity. No muscular tenderness.     Right lower leg: No edema.     Left lower leg: No edema.     Comments: No kyphosis   Lymphadenopathy:     Cervical: No cervical adenopathy.  Skin:     General: Skin is warm and dry.     Coloration: Skin is not pale.     Findings: No erythema or rash.     Comments: Solar lentigines diffusely Some sks and solar aging   Neurological:     Mental Status: She is alert. Mental status is at baseline.     Cranial Nerves: No cranial nerve deficit.     Motor: No abnormal muscle tone.     Coordination: Coordination normal.     Gait: Gait normal.     Deep Tendon Reflexes: Reflexes are normal and symmetric. Reflexes normal.  Psychiatric:        Mood and Affect: Mood normal.        Cognition and Memory: Cognition and memory normal.           Assessment & Plan:   Problem List Items Addressed This Visit      Cardiovascular and Mediastinum   Essential hypertension    bp in fair control at this time  BP Readings from Last 1 Encounters:  04/26/20 116/68   No changes needed Most recent labs reviewed  Disc lifstyle change with low sodium diet and exercise  Taking carvedilol 12.5 mg  Chlorthalidone 25 mg       Relevant Medications   chlorthalidone (HYGROTON) 25 MG tablet   carvedilol (COREG) 12.5 MG tablet   rosuvastatin (CRESTOR) 10 MG tablet     Digestive   Colon polyp    Ordered colonoscopy for 5 y recall       Relevant Orders   Ambulatory referral to Gastroenterology     Musculoskeletal and Integument   Osteopenia    dexa ordered No falls or fx  Disc ca and D intake and imp of exercise        Other   Hyperlipidemia    Disc goals for lipids and reasons to control them Rev last labs with pt Rev low sat fat diet in detail Good control with rosuvastatin  Diet is also improving       Relevant Medications   chlorthalidone (HYGROTON) 25 MG tablet   carvedilol (COREG) 12.5 MG tablet   rosuvastatin (CRESTOR) 10 MG tablet   Non-artifactual high serum prolactin    Pt continues bromocriptine No symtoms  Will plan to check prolactin with next labs       History of Mohs micrographic surgery for skin cancer    Pt keeps  regular derm appts for screening      Obesity (BMI 30-39.9)    Discussed how this problem influences overall health and the risks it imposes  Reviewed plan for weight loss with lower calorie diet (via better food choices and also portion control or program like weight watchers) and exercise building up to or more than 30 minutes 5  days per week including some aerobic activity         Routine general medical examination at a health care facility - Primary    Reviewed health habits including diet and exercise and skin cancer prevention Reviewed appropriate screening tests for age  Also reviewed health mt list, fam hx and immunization status , as well as social and family history   See HPI amw is scheduled next month  covid immunized and planning booster  Had recent flu and pna 23 vaccines-she will call back with dates  dexa ordered and mammogram ordered-pt will call to schedule No falls or fx  Colonoscopy due-polyps in the past /order done       Screening mammogram, encounter for    Mammogram ordered and pt will schedule  Enc self breast exams       Relevant Orders   MM 3D SCREEN BREAST BILATERAL   Estrogen deficiency    Screening dexa ordered      Relevant Orders   DG Bone Density   Elevated glucose level    Mild   102 fasting  disc imp of low glycemic diet and wt loss to prevent DM2  Will do a1c with next labs

## 2020-04-26 NOTE — Assessment & Plan Note (Signed)
Reviewed health habits including diet and exercise and skin cancer prevention Reviewed appropriate screening tests for age  Also reviewed health mt list, fam hx and immunization status , as well as social and family history   See HPI amw is scheduled next month  covid immunized and planning booster  Had recent flu and pna 23 vaccines-she will call back with dates  dexa ordered and mammogram ordered-pt will call to schedule No falls or fx  Colonoscopy due-polyps in the past /order done

## 2020-04-28 ENCOUNTER — Encounter: Payer: Self-pay | Admitting: Family Medicine

## 2020-04-28 ENCOUNTER — Ambulatory Visit: Payer: Medicare HMO

## 2020-05-02 ENCOUNTER — Encounter: Payer: Self-pay | Admitting: Family Medicine

## 2020-05-03 ENCOUNTER — Other Ambulatory Visit (INDEPENDENT_AMBULATORY_CARE_PROVIDER_SITE_OTHER): Payer: Medicare HMO

## 2020-05-03 ENCOUNTER — Other Ambulatory Visit: Payer: Self-pay

## 2020-05-03 DIAGNOSIS — Z87440 Personal history of urinary (tract) infections: Secondary | ICD-10-CM | POA: Diagnosis not present

## 2020-05-03 DIAGNOSIS — R32 Unspecified urinary incontinence: Secondary | ICD-10-CM | POA: Diagnosis not present

## 2020-05-03 LAB — POC URINALSYSI DIPSTICK (AUTOMATED)
Bilirubin, UA: 1
Blood, UA: NEGATIVE
Glucose, UA: NEGATIVE
Ketones, UA: 5
Leukocytes, UA: NEGATIVE
Nitrite, UA: NEGATIVE
Protein, UA: NEGATIVE
Spec Grav, UA: 1.025 (ref 1.010–1.025)
Urobilinogen, UA: 0.2 E.U./dL
pH, UA: 6 (ref 5.0–8.0)

## 2020-05-03 NOTE — Telephone Encounter (Signed)
Called pt and she's going to pick up UA supplies at the front desk and drop it off later

## 2020-05-05 LAB — URINE CULTURE
MICRO NUMBER:: 11096156
Result:: NO GROWTH
SPECIMEN QUALITY:: ADEQUATE

## 2020-05-22 ENCOUNTER — Encounter: Payer: Self-pay | Admitting: Family Medicine

## 2020-05-26 ENCOUNTER — Ambulatory Visit (INDEPENDENT_AMBULATORY_CARE_PROVIDER_SITE_OTHER): Payer: Medicare HMO

## 2020-05-26 DIAGNOSIS — Z0000 Encounter for general adult medical examination without abnormal findings: Secondary | ICD-10-CM | POA: Diagnosis not present

## 2020-05-26 NOTE — Progress Notes (Signed)
Subjective:   Nicole Suarez is a 79 y.o. female who presents for Medicare Annual (Subsequent) preventive examination.  Review of Systems: N/A      I connected with the patient today by telephone and verified that I am speaking with the correct person using two identifiers. Location patient: home Location nurse: work Persons participating in the telephone visit: patient, nurse.   I discussed the limitations, risks, security and privacy concerns of performing an evaluation and management service by telephone and the availability of in person appointments. I also discussed with the patient that there may be a patient responsible charge related to this service. The patient expressed understanding and verbally consented to this telephonic visit.        Cardiac Risk Factors include: advanced age (>70men, >30 women);hypertension;Other (see comment), Risk factor comments: hyperlipidemia     Objective:    Today's Vitals   There is no height or weight on file to calculate BMI.  Advanced Directives 05/26/2020  Does Patient Have a Medical Advance Directive? Yes  Type of Paramedic of [place];Living will  Copy of [place] in Chart? No - copy requested    Current Medications (verified) Outpatient Encounter Medications as of 05/26/2020  Medication Sig  . bromocriptine (PARLODEL) 2.5 MG tablet Take 1 tablet (2.5 mg total) by mouth daily.  . Calcium Carbonate-Vit D-Min (CALCIUM 1200 PO) Take 1 capsule by mouth daily.  . carvedilol (COREG) 12.5 MG tablet Take 1 tablet (12.5 mg total) by mouth 2 (two) times daily with a meal.  . chlorthalidone (HYGROTON) 25 MG tablet Take 0.5 tablets (12.5 mg total) by mouth daily.  . fluticasone (FLONASE) 50 MCG/ACT nasal spray Place 1-2 sprays into both nostrils daily.  Marland Kitchen ibuprofen (ADVIL) 400 MG tablet Take 400 mg by mouth every 8 (eight) hours as needed.  Marland Kitchen levocetirizine (XYZAL) 5 MG tablet Take 5 mg by mouth  every evening.  . Multiple Vitamin (MULTIVITAMIN) capsule Take 1 capsule by mouth daily.  . Multiple Vitamins-Minerals (PRESERVISION AREDS 2 PO) Take 2 capsules by mouth daily.  . NON FORMULARY SEVINA: 2 tablets daily  . Pantoprazole Sodium (PROTONIX PO) Take 40 mg by mouth daily.  . rosuvastatin (CRESTOR) 10 MG tablet Take 1 tablet (10 mg total) by mouth at bedtime.  Marland Kitchen VITAMIN D PO Take 1 capsule by mouth daily.   No facility-administered encounter medications on file as of 05/26/2020.    Allergies (verified) Ace inhibitors, Requip [ropinirole], Shrimp [shellfish allergy], and Aleve [naproxen]   History: Past Medical History:  Diagnosis Date  . Arthritis   . GERD (gastroesophageal reflux disease)   . History of allergy   . History of blood transfusion   . 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 History of urinary incontinence   . History of UTI    Past Surgical History:  Procedure Laterality Date  . APPENDECTOMY  1963  . CATARACT EXTRACTION, BILATERAL     08/2019, 09/2019  . [place]  . RECTOCELE REPAIR  2009  . REPLACEMENT TOTAL KNEE  2010  . ROTATOR CUFF REPAIR  2000  . urterine suspension  1971   Family History  Problem Relation Age of Onset  . Early death Mother   . Cancer Father   . COPD Father   . Lung cancer Father   . Pancreatic cancer Sister   . Alcohol abuse Brother   . Heart disease Brother   . Hypertension Brother   .  COPD Maternal Grandmother    Social History   Socioeconomic History  . Marital status: Married    Spouse name: Not on file  . Number of children: Not on file  . Years of education: Not on file  . Highest education level: Not on file  Occupational History  . Occupation: retired Therapist, sports  Tobacco Use  . Smoking status: Never Smoker  . Smokeless tobacco: Never Used  Substance and Sexual Activity  . Alcohol use: Never  . Drug use: Not Currently  . Sexual activity: Not on file  Other  Topics Concern  . 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 Difficulty of Paying Living Expenses: Not hard at all  Food Insecurity: No Food Insecurity  . Worried About Charity fundraiser in the Last Year: Never true  . Ran Out of Food in the Last Year: Never true  Transportation Needs: No Transportation Needs  . Lack of Transportation (Medical): No  . Lack of Transportation (Non-Medical): No  Physical Activity: Insufficiently Active  . Days of Exercise per Week: 2 days  . Minutes of Exercise per Session: 60 min  Stress: No Stress Concern Present  . Feeling of Stress : Not at all  Social Connections:   . Frequency of Communication with Friends and Family: Not on file  . Frequency of Social Gatherings with Friends and Family: Not on file  . Attends Religious Services: Not on file  . Active Member of Clubs or Organizations: Not on file  . Attends Archivist Meetings: Not on file  . 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  No  Does the patient have any non-healing wounds?  No  Has the patient had any unintentional weight loss or weight gain?  No   Diabetes:  Is the patient diabetic?  No  If diabetic, was a CBG obtained today?  N/A Did the patient bring in their glucometer from home?   N/A How often do you monitor your CBG's? N/A.   Financial Strains and Diabetes Management:  Are you having any financial strains with the device, your supplies or your medication? N/A.  Does the patient want to be seen by Chronic Care Management for management of their diabetes?  N/A Would the patient like to be referred to a Nutritionist or for Diabetic Management?  N/A    Interpreter Needed?: No  Information entered by :: CJohnson, LPN   Activities of Daily Living In your present state of health, do you have any difficulty performing the following activities: 05/26/2020  Hearing? N  Vision? N  Difficulty concentrating or making decisions? N  Walking or climbing stairs? N  Dressing or bathing? N  Doing errands, shopping? N  Preparing  Food and eating ? N  Using the Toilet? N  In the past six months, have you accidently leaked urine? Y  Comment having surgery in 06/2020  Do you have problems with loss of bowel control? N  Managing your Medications? N  Managing your Finances? N  Housekeeping or managing your Housekeeping? N  Some recent data might be hidden    Patient Care Team: Tower, Wynelle Fanny, MD as PCP - General (Family Medicine)  Indicate any recent Medical Services you may have received from other than [facility] providers in the past year (date may be approximate).     Assessment:   This is a routine wellness examination for Nicole Suarez.  Hearing/Vision screen  Hearing Screening   125Hz  250Hz  500Hz  1000Hz  2000Hz  3000Hz  4000Hz  6000Hz  8000Hz   Right ear:           Left ear:           Vision Screening Comments: Patient gets annual eye exams  Dietary issues and exercise activities discussed: Current Exercise Habits: Home exercise routine, Type of exercise: Other - see comments (exercise bike), Time (Minutes): 60, Frequency (Times/Week): 2, Weekly Exercise (Minutes/Week): 120, Intensity: Moderate, Exercise limited by: None identified  Goals    . Patient Stated      05/26/2020, I will continue to ride the exercise bike at the gym 2 days a week for about 1 hour.       Depression Screen PHQ 2/9 Scores 05/26/2020 07/26/2019  PHQ - 2 Score 0 0  PHQ- 9 Score 0 1    Fall Risk Fall Risk  05/26/2020  Falls in the past year? 0  Number falls in past yr: 0  Injury with Fall? 0  Risk for fall due to : Medication side effect  Follow up Falls evaluation completed;Falls prevention discussed    Any stairs in or around the home? Yes  If so, are there any without handrails? No  Home free of loose throw rugs in walkways, pet beds, electrical cords, etc? Yes  Adequate lighting in your home to reduce risk of falls? Yes   ASSISTIVE DEVICES UTILIZED TO PREVENT FALLS:  Life alert? No  Use of a cane, walker or w/c? No  Grab bars in the bathroom? No  Shower chair or bench in shower? No  Elevated toilet seat or a handicapped toilet? No   TIMED UP AND GO:  Was the test performed? N/A, telephone visit .  Cognitive Function: MMSE - Mini Mental State Exam 05/26/2020  Orientation to time 5  Orientation to Place 5  Registration 3  Attention/ Calculation 5  Recall 3  Language- repeat 1       Mini Cog  Mini-Cog screen was completed. Maximum score is 22. A value of 0 denotes this part of the MMSE was not completed or the patient failed this part of the Mini-Cog screening.  Immunizations Immunization History  Administered Date(s) Administered  . Influenza, High Dose Seasonal PF 03/28/2020  . Influenza-Unspecified 04/04/2019  . PFIZER SARS-COV-2 Vaccination 09/03/2019, 09/24/2019, 04/28/2020  . Pneumococcal Conjugate-13 11/30/2014  . Pneumococcal Polysaccharide-23 03/28/2020  . Td 01/12/2013  . Zoster Recombinat (Shingrix) 11/11/2016, 03/26/2017    TDAP status: Up to date Flu Vaccine status: Up to date Pneumococcal vaccine status: Up to date Covid-19 vaccine status: Completed vaccines  Qualifies for Shingles Vaccine? Yes   Zostavax completed No     Shingrix Completed?: Yes  Screening Tests Health Maintenance  Topic Date Due  . Hepatitis C Screening  Never  done  . DEXA SCAN  Never done  . TETANUS/TDAP  01/13/2023  . INFLUENZA VACCINE  Completed  . COVID-19 Vaccine  Completed  . PNA vac Low Risk Adult  Completed    Health Maintenance  Health Maintenance Due  Topic Date Due  . Hepatitis C Screening  Never done  . DEXA SCAN  Never done    Colorectal cancer screening: Referral to GI placed 04/26/2020. Pt aware the office will call re: appt. Mammogram status: scheduled 08/23/2020 Bone Density status: scheduled 08/23/2020  Lung Cancer Screening: (Low Dose CT Chest recommended if Age 25-80 years, 30 pack-year currently smoking OR have quit w/in 15 years.) does not qualify.    Additional Screening:  Hepatitis C Screening: does qualify; Completed due  Vision Screening: Recommended annual ophthalmology exams for early detection of glaucoma and other disorders of the eye. Is the patient up to date with their annual eye exam?  Yes  Who is the provider or what is the name of the office in which the patient attends annual eye exams? [facility], Dr. Glennon Mac If pt is not established with a provider, would they like to be referred to a provider to establish care? No .   Dental Screening: Recommended annual dental exams for proper oral hygiene  Community Resource Referral / Chronic Care Management: CRR required this visit?  No   CCM required this visit?  No      Plan:     I have personally reviewed and noted the following in the patient's chart:   . Medical and social history . Use of alcohol, tobacco or illicit drugs  . Current medications and supplements . Functional ability and status . Nutritional status . Physical activity . Advanced directives . List of other physicians . Hospitalizations, surgeries, and ER visits in previous 12 months . Vitals . Screenings to include cognitive, depression, and falls . Referrals  and appointments  In addition, I have reviewed and discussed with patient certain preventive protocols, quality metrics, and best practice recommendations. A written personalized care plan for preventive services as well as general preventive health recommendations were provided to patient.   Due to this being a telephonic visit, the after visit summary with patients personalized plan was offered to patient via office or my-chart. Patient preferred to pick up at office at next visit or via mychart.   Andrez Grime, LPN   [id number]

## 2020-05-26 NOTE — Progress Notes (Signed)
PCP notes:  Health Maintenance: No gaps noted   Abnormal Screenings: none   Patient concerns: Patient states that the GI office has not contacted her about setting up the colonoscopy.    Nurse concerns: none   Next PCP appt.: none

## 2020-05-26 NOTE — Patient Instructions (Signed)
[name] , Thank you for taking time to come for your Medicare Wellness Visit. I appreciate your ongoing commitment to your health goals. Please review the following plan we discussed and let me know if I can assist you in the future.   Screening recommendations/referrals: Colonoscopy: referral placed 04/26/2020 Mammogram: scheduled 08/23/2020 Bone Density: scheduled 08/23/2020 Recommended yearly ophthalmology/optometry visit for glaucoma screening and checkup Recommended yearly dental visit for hygiene and checkup  Vaccinations: Influenza vaccine: Up to date, completed 03/28/2020, due 02/2021 Pneumococcal vaccine: Completed series Tdap vaccine: Up to date, completed 01/12/2013, due 01/2023 Shingles vaccine: Completed series   Covid-19:Completed series  Advanced directives: Please bring a copy of your POA (Power of Attorney) and/or Living Will to your next appointment.   Conditions/risks identified: hypertension, hyperlipidemia  Next appointment: Follow up in one year for your annual wellness visit    Preventive Care 44 Years and Older, Female Preventive care refers to lifestyle choices and visits with your health care provider that can promote health and wellness. What does preventive care include?  A yearly physical exam. This is also called an annual well check.  Dental exams once or twice a year.  Routine eye exams. Ask your health care provider how often you should have your eyes checked.  Personal lifestyle choices, including:  Daily care of your teeth and gums.  Regular physical activity.  Eating a healthy diet.  Avoiding tobacco and drug use.  Limiting alcohol use.  Practicing safe sex.  Taking low-dose aspirin every day.  Taking vitamin and mineral supplements as recommended by your health care provider. What happens during an annual well check? The services and screenings done by your health care provider during your annual well check will depend on your age,  overall health, lifestyle risk factors, and family history of disease. Counseling  Your health care provider may ask you questions about your:  Alcohol use.  Tobacco use.  Drug use.  Emotional well-being.  Home and relationship well-being.  Sexual activity.  Eating habits.  History of falls.  Memory and ability to understand (cognition).  Work and work Statistician.  Reproductive health. Screening  You may have the following tests or measurements:  Height, weight, and BMI.  Blood pressure.  Lipid and cholesterol levels. These may be checked every 5 years, or more frequently if you are over 54 years old.  Skin check.  Lung cancer screening. You may have this screening every year starting at age 83 if you have a 30-pack-year history of smoking and currently smoke or have quit within the past 15 years.  Fecal occult blood test (FOBT) of the stool. You may have this test every year starting at age 12.  Flexible sigmoidoscopy or colonoscopy. You may have a sigmoidoscopy every 5 years or a colonoscopy every 10 years starting at age 24.  Hepatitis C blood test.  Hepatitis B blood test.  Sexually transmitted disease (STD) testing.  Diabetes screening. This is done by checking your blood sugar (glucose) after you have not eaten for a while (fasting). You may have this done every 1-3 years.  Bone density scan. This is done to screen for osteoporosis. You may have this done starting at age 44.  Mammogram. This may be done every 1-2 years. Talk to your health care provider about how often you should have regular mammograms. Talk with your health care provider about your test results, treatment options, and if necessary, the need for more tests. Vaccines  Your health care provider may  recommend certain vaccines, such as:  Influenza vaccine. This is recommended every year.  Tetanus, diphtheria, and acellular pertussis (Tdap, Td) vaccine. You may need a Td booster every 10  years.  Zoster vaccine. You may need this after age 19.  Pneumococcal 13-valent conjugate (PCV13) vaccine. One dose is recommended after age 68.  Pneumococcal polysaccharide (PPSV23) vaccine. One dose is recommended after age 60. Talk to your health care provider about which screenings and vaccines you need and how often you need them. This information is not intended to replace advice given to you by your health care provider. Make sure you discuss any questions you have with your health care provider. Document Released: 07/28/2015 Document Revised: 03/20/2016 Document Reviewed: 05/02/2015 Elsevier Interactive Patient Education  2017 [place] Prevention in the Home Falls can cause injuries. They can happen to people of all ages. There are many things you can do to make your home safe and to help prevent falls. What can I do on the outside of my home?  Regularly fix the edges of walkways and driveways and fix any cracks.  Remove anything that might make you trip as you walk through a door, such as a raised step or threshold.  Trim any bushes or trees on the path to your home.  Use bright outdoor lighting.  Clear any walking paths of anything that might make someone trip, such as rocks or tools.  Regularly check to see if handrails are loose or broken. Make sure that both sides of any steps have handrails.  Any raised decks and porches should have guardrails on the edges.  Have any leaves, snow, or ice cleared regularly.  Use sand or salt on walking paths during winter.  Clean up any spills in your garage right away. This includes oil or grease spills. What can I do in the bathroom?  Use night lights.  Install grab bars by the toilet and in the tub and shower. Do not use towel bars as grab bars.  Use non-skid mats or decals in the tub or shower.  If you need to sit down in the shower, use a plastic, non-slip stool.  Keep the floor dry. Clean up any water that  spills on the floor as soon as it happens.  Remove soap buildup in the tub or shower regularly.  Attach bath mats securely with double-sided non-slip rug tape.  Do not have throw rugs and other things on the floor that can make you trip. What can I do in the bedroom?  Use night lights.  Make sure that you have a light by your bed that is easy to reach.  Do not use any sheets or blankets that are too big for your bed. They should not hang down onto the floor.  Have a firm chair that has side arms. You can use this for support while you get dressed.  Do not have throw rugs and other things on the floor that can make you trip. What can I do in the kitchen?  Clean up any spills right away.  Avoid walking on wet floors.  Keep items that you use a lot in easy-to-reach places.  If you need to reach something above you, use a strong step stool that has a grab bar.  Keep electrical cords out of the way.  Do not use floor polish or wax that makes floors slippery. If you must use wax, use non-skid floor wax.  Do not have throw rugs and  other things on the floor that can make you trip. What can I do with my stairs?  Do not leave any items on the stairs.  Make sure that there are handrails on both sides of the stairs and use them. Fix handrails that are broken or loose. Make sure that handrails are as long as the stairways.  Check any carpeting to make sure that it is firmly attached to the stairs. Fix any carpet that is loose or worn.  Avoid having throw rugs at the top or bottom of the stairs. If you do have throw rugs, attach them to the floor with carpet tape.  Make sure that you have a light switch at the top of the stairs and the bottom of the stairs. If you do not have them, ask someone to add them for you. What else can I do to help prevent falls?  Wear shoes that:  Do not have high heels.  Have rubber bottoms.  Are comfortable and fit you well.  Are closed at the  toe. Do not wear sandals.  If you use a stepladder:  Make sure that it is fully opened. Do not climb a closed stepladder.  Make sure that both sides of the stepladder are locked into place.  Ask someone to hold it for you, if possible.  Clearly mark and make sure that you can see:  Any grab bars or handrails.  First and last steps.  Where the edge of each step is.  Use tools that help you move around (mobility aids) if they are needed. These include:  Canes.  Walkers.  Scooters.  Crutches.  Turn on the lights when you go into a dark area. Replace any light bulbs as soon as they burn out.  Set up your furniture so you have a clear path. Avoid moving your furniture around.  If any of your floors are uneven, fix them.  If there are any pets around you, be aware of where they are.  Review your medicines with your doctor. Some medicines can make you feel dizzy. This can increase your chance of falling. Ask your doctor what other things that you can do to help prevent falls. This information is not intended to replace advice given to you by your health care provider. Make sure you discuss any questions you have with your health care provider. Document Released: 04/27/2009 Document Revised: 12/07/2015 Document Reviewed: 08/05/2014 Elsevier Interactive Patient Education  2017 [employer]..

## 2020-06-28 ENCOUNTER — Encounter: Payer: Self-pay | Admitting: Family Medicine

## 2020-06-28 MED ORDER — PANTOPRAZOLE SODIUM 40 MG PO TBEC: 40.0000 mg | DELAYED_RELEASE_TABLET | Freq: Every day | ORAL | 3 refills | Status: DC

## 2020-06-28 NOTE — Telephone Encounter (Signed)
Will route to PCP since we have not filled med before, last OV was CPE on 04/26/20

## 2020-07-12 ENCOUNTER — Encounter: Payer: Self-pay | Admitting: Family Medicine

## 2020-07-22 ENCOUNTER — Other Ambulatory Visit: Payer: Medicare HMO

## 2020-07-22 ENCOUNTER — Other Ambulatory Visit: Payer: Self-pay

## 2020-07-22 DIAGNOSIS — Z20822 Contact with and (suspected) exposure to covid-19: Secondary | ICD-10-CM

## 2020-07-26 LAB — NOVEL CORONAVIRUS, NAA: SARS-CoV-2, NAA: NOT DETECTED

## 2020-08-22 ENCOUNTER — Other Ambulatory Visit: Payer: Self-pay | Admitting: Family Medicine

## 2020-08-22 DIAGNOSIS — Z1231 Encounter for screening mammogram for malignant neoplasm of breast: Secondary | ICD-10-CM

## 2020-08-22 DIAGNOSIS — E2839 Other primary ovarian failure: Secondary | ICD-10-CM

## 2020-08-23 ENCOUNTER — Other Ambulatory Visit: Payer: Medicare HMO

## 2020-08-23 ENCOUNTER — Ambulatory Visit: Payer: Medicare HMO

## 2020-08-28 ENCOUNTER — Telehealth: Payer: Self-pay | Admitting: Gastroenterology

## 2020-08-28 NOTE — Telephone Encounter (Signed)
Patient is being referred for a colonoscopy and brought in colonoscopy report. Patient is requesting to be seen by Dr. Silverio Decamp records placed on Dr. Woodward Ku desk for review.

## 2020-08-31 ENCOUNTER — Encounter: Payer: Self-pay | Admitting: Gastroenterology

## 2020-08-31 NOTE — Telephone Encounter (Signed)
Ok to schedule direct colonoscopy for h/o colon polyps. Thanks

## 2020-08-31 NOTE — Telephone Encounter (Signed)
Colon scheduled on 11/08/20 at 8:30am.

## 2020-09-09 ENCOUNTER — Encounter: Payer: Self-pay | Admitting: Family Medicine

## 2020-09-11 NOTE — Telephone Encounter (Signed)
See mychart pt wants an appt with PCP

## 2020-09-13 ENCOUNTER — Ambulatory Visit (INDEPENDENT_AMBULATORY_CARE_PROVIDER_SITE_OTHER): Payer: Medicare HMO | Admitting: Family Medicine

## 2020-09-13 ENCOUNTER — Encounter: Payer: Self-pay | Admitting: Family Medicine

## 2020-09-13 ENCOUNTER — Other Ambulatory Visit: Payer: Self-pay

## 2020-09-13 ENCOUNTER — Ambulatory Visit (INDEPENDENT_AMBULATORY_CARE_PROVIDER_SITE_OTHER)
Admission: RE | Admit: 2020-09-13 | Discharge: 2020-09-13 | Disposition: A | Discharge status: Home / Self Care | Payer: Medicare HMO | Source: Ambulatory Visit | Attending: Family Medicine | Admitting: Family Medicine

## 2020-09-13 VITALS — BP 138/80 | HR 71 | Temp 96.9°F | Ht 63.0 in | Wt 191.1 lb

## 2020-09-13 DIAGNOSIS — R1032 Left lower quadrant pain: Secondary | ICD-10-CM

## 2020-09-13 DIAGNOSIS — M5442 Lumbago with sciatica, left side: Secondary | ICD-10-CM

## 2020-09-13 DIAGNOSIS — I10 Essential (primary) hypertension: Secondary | ICD-10-CM

## 2020-09-13 DIAGNOSIS — M545 Low back pain, unspecified: Secondary | ICD-10-CM | POA: Insufficient documentation

## 2020-09-13 MED ORDER — PREDNISONE 10 MG PO TABS: ORAL_TABLET | ORAL | 0 refills | Status: DC

## 2020-09-13 NOTE — Assessment & Plan Note (Signed)
bp improved on 2nd check  BP: 138/80  Continue to monitor at home  lan to continue coreg 12.5 mg bid and chlorthalidoe 12.5 mg daily

## 2020-09-13 NOTE — Assessment & Plan Note (Signed)
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

## 2020-09-13 NOTE — Telephone Encounter (Signed)
error 

## 2020-09-13 NOTE — Patient Instructions (Signed)
Try heat on your back 10 minutes at a time Ice on the outside of your hip   xrays today and we will call you with reports and a plan  Take the prednisone as directed   If your symptoms suddenly worsen or change please let us know     Keep watching your blood pressure

## 2020-09-13 NOTE — Assessment & Plan Note (Signed)
In the setting of lumbar DD and past fusion surgery  Limited rom  Some radicular symptoms with nl neuro exam  LS film today  Px 30 mg prednisone taper and disc side effects Enc stretching and brief use of heat ER parameters discussed

## 2020-09-13 NOTE — Progress Notes (Signed)
Subjective:    Patient ID: Nicole Suarez, female    DOB: [birth year], 80 y.o.   MRN: [id number]  This visit occurred during the SARS-CoV-2 public health emergency.  Safety protocols were in place, including screening questions prior to the visit, additional usage of staff PPE, and extensive cleaning of exam room while observing appropriate contact time as indicated for disinfecting solutions.    HPI Wt Readings from Last 3 Encounters:  09/13/20 191 lb 2 oz (86.7 kg)  04/26/20 186 lb 1 oz (84.4 kg)  01/13/20 183 lb (83 kg)   33.86 kg/m  Pt went for a massage on Friday - was told muscles were tight  When she woke up Saturday- pain in L leg (hard to walk)  Has had back surgery in the past- intermittently this happens    Felt a little better on Sunday   Noted pain in groin and hip  A little numbness in lateral hip that can radiate to the knee   Taking ibuprofen Took some tramadol (left over) occ tylenol   Past history for back  3 y ago had a disc problem that caused severe L leg symptoms  Did a fusion of L3-4   Today- pain to walk/put weight on her leg but ok when sitting    Recent urology surgery  Found sling was impairing her emptying  Repaired that and symptoms are better  Had a uti right after surgery and it was treated bactrim and then macrobid   Lab Results  Component Value Date   CREATININE 1.04 04/20/2020   BUN 22 04/20/2020   NA 139 04/20/2020   K 3.9 04/20/2020   CL 101 04/20/2020   CO2 30 04/20/2020   BP Readings from Last 3 Encounters:  09/13/20 138/80  04/26/20 116/68  01/13/20 (!) 145/70    pt was drinking a lot of gatorade after her surgery (? Too much sodium)  Takes coreg 12.5 mg twiced daily  Chlorthalidone 12.5 mg daily   Patient Active Problem List   Diagnosis Date Noted  . Low back pain 09/13/2020  . Groin pain, left 09/13/2020  . Routine general medical examination at a health care facility 04/26/2020  . Screening mammogram, encounter  for 04/26/2020  . Estrogen deficiency 04/26/2020  . Elevated glucose level 04/26/2020  . 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  . GERD (gastroesophageal reflux disease) 07/26/2019  . Essential hypertension 07/26/2019  . Hyperlipidemia 07/26/2019  . Allergic rhinitis 07/26/2019  . Caregiver stress 07/26/2019  . Urge incontinence 07/26/2019  . 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 RLS (restless legs syndrome) 07/26/2019  . Colon polyp 07/26/2019  . Osteopenia 07/26/2019  . History of Mohs micrographic surgery for skin cancer 07/26/2019  . Obesity (BMI 30-39.9) 07/26/2019   Past Medical History:  Diagnosis Date  . Arthritis   . GERD (gastroesophageal reflux disease)   . History of allergy   . History of blood transfusion   . 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 History of urinary incontinence   . History of UTI    Past Surgical History:  Procedure Laterality Date  . APPENDECTOMY  1963  . CATARACT EXTRACTION, BILATERAL     02 /2021, 09/2019  . [place]  . RECTOCELE REPAIR  2009  . REPLACEMENT TOTAL KNEE  2010  . ROTATOR CUFF REPAIR  2000  . urterine suspension  1971   Social History   Tobacco Use  . Smoking status: Never  Smoker  . Smokeless tobacco: Never Used  Substance Use Topics  . Alcohol use: Never  . Drug use: Not Currently   Family History  Problem Relation Age of Onset  . Early death Mother   . Cancer Father   . COPD Father   . Lung cancer Father   . Pancreatic cancer Sister   . Alcohol abuse Brother   . Heart disease Brother   . Hypertension Brother   . COPD Maternal Grandmother    Allergies  Allergen Reactions  . Ace Inhibitors Other (See Comments)    THROAT SWELLING  . Requip [Ropinirole]     Vision change   . Shrimp [Shellfish Allergy] Nausea And Vomiting  . Aleve [Naproxen] Rash   Current Outpatient Medications on File Prior to Visit  Medication Sig  Dispense Refill  . bromocriptine (PARLODEL) 2.5 MG tablet Take 1 tablet (2.5 mg total) by mouth daily. 90 tablet 3  . Calcium Carbonate-Vit D-Min (CALCIUM 1200 PO) Take 2 capsules by mouth daily.    . carvedilol (COREG) 12.5 MG tablet Take 1 tablet (12.5 mg total) by mouth 2 (two) times daily with a meal. 180 tablet 3  . chlorthalidone (HYGROTON) 25 MG tablet Take 0.5 tablets (12.5 mg total) by mouth daily. 45 tablet 3  . fluticasone (FLONASE) 50 MCG/ACT nasal spray Place 1-2 sprays into both nostrils daily.    Marland Kitchen ibuprofen (ADVIL) 400 MG tablet Take 400 mg by mouth every 8 (eight) hours as needed.    Marland Kitchen levocetirizine (XYZAL) 5 MG tablet Take 5 mg by mouth every evening.    . Multiple Vitamin (MULTIVITAMIN) capsule Take 1 capsule by mouth daily.    . Multiple Vitamins-Minerals (PRESERVISION AREDS 2 PO) Take 2 capsules by mouth daily.    . NON FORMULARY SEVINA: 2 tablets daily    . pantoprazole (PROTONIX) 40 MG tablet Take 1 tablet (40 mg total) by mouth daily. 90 tablet 3  . rosuvastatin (CRESTOR) 10 MG tablet Take 1 tablet (10 mg total) by mouth at bedtime. 90 tablet 3  . VITAMIN D PO Take 1 capsule by mouth daily.    . Wheat Dextrin (BENEFIBER PO) Take by mouth daily.     No current facility-administered medications on file prior to visit.    Review of Systems  Constitutional: Negative for activity change, appetite change, fatigue, fever and unexpected weight change.  HENT: Negative for congestion, ear pain, rhinorrhea, sinus pressure and sore throat.   Eyes: Negative for pain, redness and visual disturbance.  Respiratory: Negative for cough, shortness of breath and wheezing.   Cardiovascular: Negative for chest pain and palpitations.  Gastrointestinal: Negative for abdominal pain, blood in stool, constipation and diarrhea.  Endocrine: Negative for polydipsia and polyuria.  Genitourinary: Negative for dysuria, frequency and urgency.  Musculoskeletal: Positive for arthralgias, back pain  and gait problem. Negative for joint swelling and myalgias.  Skin: Negative for pallor and rash.  Allergic/Immunologic: Negative for environmental allergies.  Neurological: Negative for dizziness, syncope and headaches.  Hematological: Negative for adenopathy. Does not bruise/bleed easily.  Psychiatric/Behavioral: Negative for decreased concentration and dysphoric mood. The patient is not nervous/anxious.        Objective:   Physical Exam Constitutional:      General: She is not in acute distress.    Appearance: Normal appearance. She is well-developed and well-nourished. She is obese.  HENT:     Head: Normocephalic and atraumatic.  Eyes:     General: No scleral icterus.  Extraocular Movements: EOM normal.     Conjunctiva/sclera: Conjunctivae normal.     Pupils: Pupils are equal, round, and reactive to light.  Cardiovascular:     Rate and Rhythm: Normal rate and regular rhythm.     Comments: LE varicosities do not appear inflamed  Pulmonary:     Effort: Pulmonary effort is normal.     Breath sounds: Normal breath sounds. No wheezing or rales.  Abdominal:     General: Bowel sounds are normal. There is no distension.     Palpations: Abdomen is soft.     Tenderness: There is no abdominal tenderness.  Musculoskeletal:        General: Tenderness present.     Cervical back: Normal range of motion and neck supple.     Lumbar back: Spasms and tenderness present. No edema, deformity or bony tenderness. Decreased range of motion.     Right lower leg: No edema.     Left lower leg: No edema.     Comments: Surgical scar over mid LS and some bony tenderness in mid LS  Some muscular tenderness L lumbar and SI joint area Nl rom hips Some pain on full flex of L hip Some L greater trochanter tenderness SLR yields some back pain on the L  Spine flex 90 deg and ext 10 deg Pain on R lateral bend  Lymphadenopathy:     Cervical: No cervical adenopathy.  Skin:    General: Skin is warm and  dry.     Coloration: Skin is not pale.     Findings: No erythema or rash.  Neurological:     Mental Status: She is alert.     Cranial Nerves: No cranial nerve deficit.     Sensory: No sensory deficit.     Motor: No atrophy or abnormal muscle tone.     Coordination: Coordination normal.     Deep Tendon Reflexes: Strength normal and reflexes are normal and symmetric. Reflexes normal.     Comments: Gait favors R side due to pain    Psychiatric:        Mood and Affect: Mood and affect and mood normal.           Assessment & Plan:   Problem List Items Addressed This Visit      Cardiovascular and Mediastinum   Essential hypertension    bp improved on 2nd check  BP: 138/80  Continue to monitor at home  lan to continue coreg 12.5 mg bid and chlorthalidoe 12.5 mg daily        Other   Low back pain - Primary    In the setting of lumbar DD and past fusion surgery  Limited rom  Some radicular symptoms with nl neuro exam  LS film today  Px 30 mg prednisone taper and disc side effects Enc stretching and brief use of heat ER parameters discussed       Relevant Medications   predniSONE (DELTASONE) 10 MG tablet   Other Relevant Orders   DG Lumbar Spine Complete   Groin pain, left    Disc possibility of OA of hip joint vs pinched nerve (meralgia paresthetica)  Wt loss encouraged  Bike ok if not painful Hip film today   Also some trochanteric tenderness- recommended ice   Prednisone 30 mg taper px      Relevant Orders   DG Hip Unilat W OR W/O Pelvis 2-3 Views Left

## 2020-09-25 ENCOUNTER — Encounter: Payer: Self-pay | Admitting: Family Medicine

## 2020-10-05 ENCOUNTER — Ambulatory Visit: Payer: Medicare HMO

## 2020-10-25 ENCOUNTER — Other Ambulatory Visit: Payer: Self-pay

## 2020-10-25 ENCOUNTER — Ambulatory Visit (AMBULATORY_SURGERY_CENTER): Payer: Self-pay | Admitting: *Deleted

## 2020-10-25 VITALS — Ht 64.0 in | Wt 181.0 lb

## 2020-10-25 DIAGNOSIS — Z86010 Personal history of colonic polyps: Secondary | ICD-10-CM

## 2020-10-25 MED ORDER — PEG-KCL-NACL-NASULF-NA ASC-C 100 G PO SOLR: 1.0000 | Freq: Once | ORAL | 0 refills | Status: AC

## 2020-10-25 NOTE — Progress Notes (Signed)

## 2020-11-08 ENCOUNTER — Encounter: Payer: Medicare HMO | Admitting: Gastroenterology

## 2020-11-15 ENCOUNTER — Other Ambulatory Visit: Payer: Self-pay

## 2020-11-15 ENCOUNTER — Encounter: Payer: Self-pay | Admitting: Gastroenterology

## 2020-11-15 ENCOUNTER — Ambulatory Visit (AMBULATORY_SURGERY_CENTER): Payer: Medicare HMO | Admitting: Gastroenterology

## 2020-11-15 VITALS — BP 175/73 | HR 66 | Temp 96.0°F | Resp 13 | Ht 63.0 in | Wt 181.0 lb

## 2020-11-15 DIAGNOSIS — D122 Benign neoplasm of ascending colon: Secondary | ICD-10-CM

## 2020-11-15 DIAGNOSIS — Z86010 Personal history of colonic polyps: Secondary | ICD-10-CM

## 2020-11-15 DIAGNOSIS — D120 Benign neoplasm of cecum: Secondary | ICD-10-CM

## 2020-11-15 MED ORDER — PEG-KCL-NACL-NASULF-NA ASC-C 100 G PO SOLR: 1.0000 | Freq: Once | ORAL | 0 refills | Status: AC

## 2020-11-15 MED ORDER — SUTAB 1479-225-188 MG PO TABS: 1.0000 | ORAL_TABLET | Freq: Once | ORAL | 0 refills | Status: AC

## 2020-11-15 MED ORDER — SODIUM CHLORIDE 0.9 % IV SOLN: 500.0000 mL | INTRAVENOUS | Status: DC

## 2020-11-15 NOTE — Op Note (Signed)
[place] [facility] Patient Name: Nicole Suarez Procedure Date: 11/15/2020 8:46 AM MRN: [id number] Endoscopist: Mauri Pole , MD Age: 80 Referring MD:  Date of Birth: Apr 18, 1941 Gender: Female Account #: [id number] Procedure:                Colonoscopy Indications:              High risk colon cancer surveillance: Personal                            history of colonic polyps Medicines:                Monitored Anesthesia Care Procedure:                Pre-Anesthesia Assessment:                           - Prior to the procedure, a History and Physical                            was performed, and patient medications and                            allergies were reviewed. The patient's tolerance of                            previous anesthesia was also reviewed. The risks                            and benefits of the procedure and the sedation                            options and risks were discussed with the patient.                            All questions were answered, and informed consent                            was obtained. Prior Anticoagulants: The patient has                            taken no previous anticoagulant or antiplatelet                            agents. ASA Grade Assessment: II - A patient with                            mild systemic disease. After reviewing the risks                            and benefits, the patient was deemed in                            satisfactory condition to undergo the procedure.  After obtaining informed consent, the colonoscope                            was passed under direct vision. Throughout the                            procedure, the patient's blood pressure, pulse, and                            oxygen saturations were monitored continuously. The                            Olympus PCF-H190DL (#[id number]) Colonoscope was                            introduced through the anus and  advanced to the the                            cecum, identified by appendiceal orifice and                            ileocecal valve. The colonoscopy was technically                            difficult and complex due to inadequate bowel prep.                            Successful completion of the procedure was aided by                            lavage. The patient tolerated the procedure well.                            The quality of the bowel preparation was                            inadequate. The ileocecal valve, appendiceal                            orifice, and rectum were photographed. Scope In: 8:56:25 AM Scope Out: 9:19:40 AM Scope Withdrawal Time: 0 hours 15 minutes 18 seconds  Total Procedure Duration: 0 hours 23 minutes 15 seconds  Findings:                 The perianal and digital rectal examinations were                            normal.                           A moderate amount of stool was found in the entire                            colon, interfering with visualization. Lavage of  the area was performed, resulting in incomplete                            clearance with continued poor visualization.                           Two sessile polyps were found in the cecum. The                            polyps were 4 to 5 mm in size. These polyps were                            removed with a cold snare. Resection and retrieval                            were complete.                           A 2 mm polyp was found in the ascending colon. The                            polyp was sessile. The polyp was removed with a                            cold biopsy forceps. Resection and retrieval were                            complete.                           Scattered small and large-mouthed diverticula were                            found in the sigmoid colon, descending colon and                            ascending colon.                            Non-bleeding internal hemorrhoids were found during                            retroflexion. The hemorrhoids were large. Complications:            No immediate complications. Estimated Blood Loss:     Estimated blood loss was minimal. Impression:               - Preparation of the colon was inadequate.                           - Stool in the entire examined colon.                           - Two 4 to 5 mm polyps in the cecum, removed with a  cold snare. Resected and retrieved.                           - One 2 mm polyp in the ascending colon, removed                            with a cold biopsy forceps. Resected and retrieved.                           - Diverticulosis in the sigmoid colon, in the                            descending colon and in the ascending colon.                           - Non-bleeding internal hemorrhoids. Recommendation:           - Patient has a contact number available for                            emergencies. The signs and symptoms of potential                            delayed complications were discussed with the                            patient. Return to normal activities tomorrow.                            Written discharge instructions were provided to the                            patient.                           - Resume previous diet.                           - Continue present medications.                           - Await pathology results.                           - Repeat colonoscopy at the next available                            appointment due to suboptimal bowel prep.                           - For future colonoscopy the patient will require                            an extended preparation. If there are any  questions, please contact the gastroenterologist. Mauri Pole, MD 11/15/2020 9:27:48 AM This report has been signed electronically.

## 2020-11-15 NOTE — Progress Notes (Signed)
Pt's states no medical or surgical changes since previsit or office visit. 

## 2020-11-15 NOTE — Progress Notes (Signed)
A/ox3, pleased with MAC, report to RN 

## 2020-11-15 NOTE — Progress Notes (Signed)
Called to room to assist during endoscopic procedure.  Patient ID and intended procedure confirmed with present staff. Received instructions for my participation in the procedure from the performing physician.  

## 2020-11-15 NOTE — Patient Instructions (Signed)
YOU HAD AN ENDOSCOPIC PROCEDURE TODAY AT THE [facility]:   Refer to the procedure report that was given to you for any specific questions about what was found during the examination.  If the procedure report does not answer your questions, please call your gastroenterologist to clarify.  If you requested that your care partner not be given the details of your procedure findings, then the procedure report has been included in a sealed envelope for you to review at your convenience later.  YOU SHOULD EXPECT: Some feelings of bloating in the abdomen. Passage of more gas than usual.  Walking can help get rid of the air that was put into your GI tract during the procedure and reduce the bloating. If you had a lower endoscopy (such as a colonoscopy or flexible sigmoidoscopy) you may notice spotting of blood in your stool or on the toilet paper. If you underwent a bowel prep for your procedure, you may not have a normal bowel movement for a few days.  Please Note:  You might notice some irritation and congestion in your nose or some drainage.  This is from the oxygen used during your procedure.  There is no need for concern and it should clear up in a day or so.  SYMPTOMS TO REPORT IMMEDIATELY:   Following lower endoscopy (colonoscopy or flexible sigmoidoscopy):  Excessive amounts of blood in the stool  Significant tenderness or worsening of abdominal pains  Swelling of the abdomen that is new, acute  Fever of 100F or higher    For urgent or emergent issues, a gastroenterologist can be reached at any hour by calling [phone]. Do not use MyChart messaging for urgent concerns.    DIET:  We do recommend a small meal at first, but then you may proceed to your regular diet.  Drink plenty of fluids but you should avoid alcoholic beverages for 24 hours.  ACTIVITY:  You should plan to take it easy for the rest of today and you should NOT DRIVE or use heavy machinery until tomorrow  (because of the sedation medicines used during the test).    FOLLOW UP: Our staff will call the number listed on your records 48-72 hours following your procedure to check on you and address any questions or concerns that you may have regarding the information given to you following your procedure. If we do not reach you, we will leave a message.  We will attempt to reach you two times.  During this call, we will ask if you have developed any symptoms of COVID 19. If you develop any symptoms (ie: fever, flu-like symptoms, shortness of breath, cough etc.) before then, please call [phone].  If you test positive for Covid 19 in the 2 weeks post procedure, please call and report this information to us.    If any biopsies were taken you will be contacted by phone or by letter within the next 1-3 weeks.  Please call us at [phone] if you have not heard about the biopsies in 3 weeks.    SIGNATURES/CONFIDENTIALITY: You and/or your care partner have signed paperwork which will be entered into your electronic medical record.  These signatures attest to the fact that that the information above on your After Visit Summary has been reviewed and is understood.  Full responsibility of the confidentiality of this discharge information lies with you and/or your care-partner.   Resume medications. Information given on polyps and diverticulosis. 

## 2020-11-17 ENCOUNTER — Telehealth: Payer: Self-pay | Admitting: *Deleted

## 2020-11-17 NOTE — Telephone Encounter (Signed)
  Follow up Call-  Call back number 11/15/2020  Post procedure Call Back phone  # [phone]  Permission to leave phone message Yes  Some recent data might be hidden     Patient questions:  Do you have a fever, pain , or abdominal swelling? No. Pain Score  0 *  Have you tolerated food without any problems? Yes.    Have you been able to return to your normal activities? Yes.    Do you have any questions about your discharge instructions: Diet   No. Medications  No. Follow up visit  No.  Do you have questions or concerns about your Care? No.  Actions: * If pain score is 4 or above: No action needed, pain <4.  1. Have you developed a fever since your procedure? no  2.   Have you had an respiratory symptoms (SOB or cough) since your procedure? no  3.   Have you tested positive for COVID 19 since your procedure no  4.   Have you had any family members/close contacts diagnosed with the COVID 19 since your procedure?  no   If yes to any of these questions please route to Joylene John, RN and Joella Prince, RN

## 2020-11-20 ENCOUNTER — Telehealth: Payer: Self-pay | Admitting: Gastroenterology

## 2020-11-20 ENCOUNTER — Encounter: Payer: Self-pay | Admitting: Family Medicine

## 2020-11-20 NOTE — Telephone Encounter (Addendum)
Good morning Dr. Silverio Decamp, patient called stating her daughter tested positive for covid and she would be the one driving her for her procedure so she rescheduled from 11/22/20 to 03/14/21.

## 2020-11-22 ENCOUNTER — Encounter: Payer: Medicare HMO | Admitting: Gastroenterology

## 2020-12-01 ENCOUNTER — Encounter: Payer: Self-pay | Admitting: Gastroenterology

## 2021-01-22 ENCOUNTER — Ambulatory Visit
Admission: RE | Admit: 2021-01-22 | Discharge: 2021-01-22 | Disposition: A | Discharge status: Home / Self Care | Payer: Medicare HMO | Source: Ambulatory Visit | Attending: Family Medicine | Admitting: Family Medicine

## 2021-01-22 ENCOUNTER — Other Ambulatory Visit: Payer: Self-pay

## 2021-01-22 DIAGNOSIS — E2839 Other primary ovarian failure: Secondary | ICD-10-CM

## 2021-01-22 DIAGNOSIS — Z1231 Encounter for screening mammogram for malignant neoplasm of breast: Secondary | ICD-10-CM

## 2021-02-28 ENCOUNTER — Other Ambulatory Visit: Payer: Self-pay

## 2021-02-28 ENCOUNTER — Ambulatory Visit (AMBULATORY_SURGERY_CENTER): Payer: Self-pay

## 2021-02-28 VITALS — Ht 63.0 in | Wt 156.0 lb

## 2021-02-28 DIAGNOSIS — Z86010 Personal history of colonic polyps: Secondary | ICD-10-CM

## 2021-02-28 NOTE — Progress Notes (Signed)
No allergies to soy or egg Pt is not on blood thinners or diet pills Denies issues with sedation/intubation Denies atrial flutter/fib Denies constipation     Pt is aware of Covid safety and care partner requirements.   Pt states she has both the Movi and sutabs on hand -no rx needed.  Is not taking Benefiber anymore, colace, miralax, and senna each night-she goes daily.  Will be on a 2 day prep.  Pt is incontinent at times and very self conscious about it. Note made on face sheet.

## 2021-03-01 ENCOUNTER — Telehealth: Payer: Self-pay | Admitting: Gastroenterology

## 2021-03-01 NOTE — Telephone Encounter (Signed)
Pt states she called the Pharmacy and was told it is the same as Movi prep that she has

## 2021-03-01 NOTE — Telephone Encounter (Signed)
Inbound call from patient. Picked up prep but states it is peg 3350 and not moviprep like the instructions.

## 2021-03-14 ENCOUNTER — Encounter: Payer: Self-pay | Admitting: Gastroenterology

## 2021-03-14 ENCOUNTER — Ambulatory Visit (AMBULATORY_SURGERY_CENTER): Payer: Medicare HMO | Admitting: Gastroenterology

## 2021-03-14 ENCOUNTER — Other Ambulatory Visit: Payer: Self-pay

## 2021-03-14 VITALS — BP 145/64 | HR 63 | Temp 95.5°F | Resp 18 | Ht 63.0 in | Wt 156.0 lb

## 2021-03-14 DIAGNOSIS — Z86010 Personal history of colonic polyps: Secondary | ICD-10-CM | POA: Diagnosis present

## 2021-03-14 DIAGNOSIS — D123 Benign neoplasm of transverse colon: Secondary | ICD-10-CM | POA: Diagnosis not present

## 2021-03-14 DIAGNOSIS — D122 Benign neoplasm of ascending colon: Secondary | ICD-10-CM

## 2021-03-14 MED ORDER — SODIUM CHLORIDE 0.9 % IV SOLN: 500.0000 mL | Freq: Once | INTRAVENOUS | Status: DC

## 2021-03-14 NOTE — Progress Notes (Signed)
To PACU, VSS. Report to [email or web address] 

## 2021-03-14 NOTE — Op Note (Signed)
[facility] Patient Name: Nicole Suarez Procedure Date: 03/14/2021 9:23 AM MRN: [id number] Endoscopist: Mauri Pole , MD Age: 80 Referring MD:  Date of Birth: 02-26-1941 Gender: Female Account #: [id number] Procedure:                Colonoscopy Indications:              High risk colon cancer surveillance: Personal                            history of colonic polyps, Surveillance: Personal                            history of adenomatous polyps, inadequate prep on                            last colonoscopy (less than 1 year ago), High risk                            colon cancer surveillance: Personal history of                            multiple (3 or more) adenomas, High risk colon                            cancer surveillance: Personal history of adenoma                            less than 10 mm in size Medicines:                Monitored Anesthesia Care Procedure:                Pre-Anesthesia Assessment:                           - Prior to the procedure, a History and Physical                            was performed, and patient medications and                            allergies were reviewed. The patient's tolerance of                            previous anesthesia was also reviewed. The risks                            and benefits of the procedure and the sedation                            options and risks were discussed with the patient.                            All questions were answered, and informed consent  was obtained. Prior Anticoagulants: The patient has                            taken no previous anticoagulant or antiplatelet                            agents. ASA Grade Assessment: II - A patient with                            mild systemic disease. After reviewing the risks                            and benefits, the patient was deemed in                            satisfactory condition to undergo the  procedure.                           After obtaining informed consent, the colonoscope                            was passed under direct vision. Throughout the                            procedure, the patient's blood pressure, pulse, and                            oxygen saturations were monitored continuously. The                            Olympus PCF-H190DL [id number]) Colonoscope was                            introduced through the anus and advanced to the the                            cecum, identified by appendiceal orifice and                            ileocecal valve. The colonoscopy was performed                            without difficulty. The patient tolerated the                            procedure well. The quality of the bowel                            preparation was adequate to identify polyps. The                            ileocecal valve, appendiceal orifice, and rectum  were photographed. Scope In: 9:31:32 AM Scope Out: 9:57:36 AM Scope Withdrawal Time: 0 hours 15 minutes 21 seconds  Total Procedure Duration: 0 hours 26 minutes 4 seconds  Findings:                 The perianal and digital rectal examinations were                            normal.                           Four sessile polyps were found in the transverse                            colon and ascending colon. The polyps were 4 to 6                            mm in size. These polyps were removed with a cold                            snare. Resection and retrieval were complete.                           Scattered small-mouthed diverticula were found in                            the sigmoid colon and descending colon.                           A diffuse area of mild melanosis was found in the                            entire colon.                           Non-bleeding external and internal hemorrhoids were                            found during retroflexion. The  hemorrhoids were                            medium-sized. Complications:            No immediate complications. Estimated Blood Loss:     Estimated blood loss was minimal. Impression:               - Four 4 to 6 mm polyps in the transverse colon and                            in the ascending colon, removed with a cold snare.                            Resected and retrieved.                           - Moderate diverticulosis in the sigmoid colon  and                            in the descending colon.                           - Melanosis in the colon.                           - Non-bleeding external and internal hemorrhoids. Recommendation:           - Patient has a contact number available for                            emergencies. The signs and symptoms of potential                            delayed complications were discussed with the                            patient. Return to normal activities tomorrow.                            Written discharge instructions were provided to the                            patient.                           - Resume previous diet.                           - Continue present medications.                           - Await pathology results.                           - No repeat colonoscopy due to age.                           - Return to GI clinic PRN. Mauri Pole, MD 03/14/2021 10:03:23 AM This report has been signed electronically.

## 2021-03-14 NOTE — Progress Notes (Signed)
[facility] History and Physical   Primary Care Physician:  Tower, Wynelle Fanny, MD   Reason for Procedure:  History of adenomatous colon polyps  Plan:    Surveillance colonoscopy with possible interventions as needed     HPI: Nicole Suarez is a very pleasant 80 y.o. female here for  colonoscopy. Inadequate prep on prior colonoscopy with removal of adenomatous polyps Denies any nausea, vomiting, abdominal pain, melena or bright red blood per rectum  The risks and benefits as well as alternatives of endoscopic procedure(s) have been discussed and reviewed. All questions answered. The patient agrees to proceed.    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 Authorizing Provider  bromocriptine (PARLODEL) 2.5 MG tablet Take 1 tablet (2.5 mg total) by mouth daily. 04/26/20  Yes Tower, Wynelle Fanny, MD  Calcium Carbonate-Vit D-Min (CALCIUM 1200 PO) Take 2 capsules by mouth daily.   Yes [provider]  carvedilol (COREG) 12.5 MG tablet Take 1 tablet (12.5 mg total) by mouth 2 (two) times daily with a meal. 04/26/20  Yes Tower, Wynelle Fanny, MD  chlorthalidone (HYGROTON) 25 MG tablet Take 0.5 tablets (12.5 mg total) by mouth daily. 04/26/20   Yes Tower, Wynelle Fanny, MD  fluticasone (FLONASE) 50 MCG/ACT nasal spray Place 1-2 sprays into both nostrils daily.   Yes [provider]  ibuprofen (ADVIL) 400 MG tablet Take 400 mg by mouth every 8 (eight) hours as needed.   Yes [provider]  levocetirizine (XYZAL) 5 MG tablet Take 5 mg by mouth every evening.   Yes [provider]  mirabegron ER (MYRBETRIQ) 25 MG TB24 tablet Take 1 tablet by mouth daily. 03/09/21  Yes [provider]  Multiple Vitamin (MULTIVITAMIN) capsule Take 1 capsule by mouth daily.   Yes [provider]  Multiple Vitamins-Minerals (PRESERVISION AREDS 2 PO) Take 2 capsules by mouth daily.   Yes [provider]  rosuvastatin (CRESTOR) 10 MG tablet Take 1 tablet (10 mg total) by mouth at bedtime. 04/26/20  Yes Tower, Wynelle Fanny, MD  VITAMIN D PO Take 1 capsule by mouth daily.   Yes [provider]  Docusate Sodium (COLACE PO) Take by mouth.    [provider]  NON FORMULARY SEVINA: 2 tablets daily Patient not taking: No sig reported    [provider]  pantoprazole (PROTONIX) 40 MG tablet Take 1 tablet (40 mg total) by mouth daily. Patient not taking: No sig reported 06/28/20   Tower, Wynelle Fanny, MD  SENNA PO Take by mouth.    [provider]  Wheat Dextrin (BENEFIBER  PO) Take by mouth daily. Patient not taking: No sig reported    [provider]    Current Outpatient Medications  Medication Sig Dispense Refill   bromocriptine (PARLODEL) 2.5 MG tablet Take 1 tablet (2.5 mg total) by mouth daily. 90 tablet 3   Calcium Carbonate-Vit D-Min (CALCIUM 1200 PO) Take 2 capsules by mouth daily.     carvedilol (COREG) 12.5 MG tablet Take 1 tablet (12.5 mg total) by mouth 2 (two) times daily with a meal. 180 tablet 3   chlorthalidone (HYGROTON) 25 MG tablet Take 0.5 tablets (12.5 mg total) by mouth daily. 45 tablet 3   fluticasone (FLONASE) 50 MCG/ACT nasal spray Place 1-2 sprays into both  nostrils daily.     ibuprofen (ADVIL) 400 MG tablet Take 400 mg by mouth every 8 (eight) hours as needed.     levocetirizine (XYZAL) 5 MG tablet Take 5 mg by mouth every evening.     mirabegron ER (MYRBETRIQ) 25 MG TB24 tablet Take 1 tablet by mouth daily.     Multiple Vitamin (MULTIVITAMIN) capsule Take 1 capsule by mouth daily.     Multiple Vitamins-Minerals (PRESERVISION AREDS 2 PO) Take 2 capsules by mouth daily.     rosuvastatin (CRESTOR) 10 MG tablet Take 1 tablet (10 mg total) by mouth at bedtime. 90 tablet 3   VITAMIN D PO Take 1 capsule by mouth daily.     Docusate Sodium (COLACE PO) Take by mouth.     NON FORMULARY SEVINA: 2 tablets daily (Patient not taking: No sig reported)     pantoprazole (PROTONIX) 40 MG tablet Take 1 tablet (40 mg total) by mouth daily. (Patient not taking: No sig reported) 90 tablet 3   SENNA PO Take by mouth.     Wheat Dextrin (BENEFIBER PO) Take by mouth daily. (Patient not taking: No sig reported)     Current Facility-Administered Medications  Medication Dose Route Frequency Provider Last Rate Last Admin   0.9 %  sodium chloride infusion  500 mL Intravenous Once Mauri Pole, MD        Allergies as of 03/14/2021 - Review Complete 03/14/2021  Allergen Reaction Noted   Ace inhibitors Other (See Comments) 07/26/2019   Requip [ropinirole]  07/26/2019   Shrimp [shellfish allergy] Nausea And Vomiting 07/26/2019   Aleve [naproxen] Rash 07/26/2019    Family History  Problem Relation Age of Onset   Early death Mother    Cancer Father    COPD Father    Lung cancer Father    Pancreatic cancer Sister    Alcohol abuse Brother    Heart disease Brother    Hypertension Brother    COPD Maternal Grandmother    Colon cancer Neg Hx    Esophageal cancer Neg Hx    Stomach cancer Neg Hx    Rectal cancer Neg Hx    Colon polyps Neg Hx     Social History   Socioeconomic History   Marital status: Married    Spouse name: Not on file   Number of  children: Not on file   Years of education: Not on file   Highest education level: Not on file  Occupational History   Occupation: retired Therapist, sports  Tobacco Use   Smoking status: Never   Smokeless tobacco: Never  Vaping Use   Vaping Use: Never used  Substance and Sexual Activity   Alcohol use: Never   Drug use: Not Currently   Sexual activity: Not on file  Other Topics  Concern   Not on file  Social History Narrative   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    Moved her from [place] in 2020   Cares for disabled husband   Lives in [place] Strain: Low Risk    Difficulty of Paying Living Expenses: Not hard at all  Food Insecurity: No Food Insecurity   Worried About Charity fundraiser in the Last Year: Never true   Arboriculturist in the Last Year: Never true  Transportation Needs: No Transportation Needs   Lack of Transportation (Medical): No   Lack of Transportation (Non-Medical): No  Physical Activity: Insufficiently Active   Days of Exercise per Week: 2 days   Minutes of Exercise per Session: 60 min  Stress: No Stress Concern Present   Feeling of Stress : Not at all  Social Connections: Not on file  Intimate Partner Violence: Not At Risk   Fear of Current or Ex-Partner: No   Emotionally Abused: No   Physically Abused: No   Sexually Abused: No    Review of Systems:  All other review of systems negative except as mentioned in the HPI.  Physical Exam: Vital signs in last 24 hours: BP (!) 146/63   Pulse 67   Temp (!) 95.5 F (35.3 C) (Temporal)   Resp (!) 27   Ht '5\' 3"'$  (1.6 m)   Wt 156 lb (70.8 kg)   SpO2 99%   BMI 27.63 kg/m     General:   Alert, NAD Lungs:  Clear .   Heart:  Regular rate and rhythm Abdomen:  Soft, nontender and nondistended. Neuro/Psych:  Alert and cooperative. Normal mood and affect. A and O x 3  Reviewed labs, radiology imaging, old records and pertinent past GI work up  Patient is  appropriate for planned procedure(s) and anesthesia in an ambulatory setting   K. Denzil Magnuson , MD [phone]

## 2021-03-14 NOTE — Progress Notes (Signed)
Called to room to assist during endoscopic procedure.  Patient ID and intended procedure confirmed with present staff. Received instructions for my participation in the procedure from the performing physician.  

## 2021-03-14 NOTE — Patient Instructions (Signed)
Please read handouts provided. Continue present medications. Await pathology results. Return to GI clinic as needed.     YOU HAD AN ENDOSCOPIC PROCEDURE TODAY AT THE [facility]:   Refer to the procedure report that was given to you for any specific questions about what was found during the examination.  If the procedure report does not answer your questions, please call your gastroenterologist to clarify.  If you requested that your care partner not be given the details of your procedure findings, then the procedure report has been included in a sealed envelope for you to review at your convenience later.  YOU SHOULD EXPECT: Some feelings of bloating in the abdomen. Passage of more gas than usual.  Walking can help get rid of the air that was put into your GI tract during the procedure and reduce the bloating. If you had a lower endoscopy (such as a colonoscopy or flexible sigmoidoscopy) you may notice spotting of blood in your stool or on the toilet paper. If you underwent a bowel prep for your procedure, you may not have a normal bowel movement for a few days.  Please Note:  You might notice some irritation and congestion in your nose or some drainage.  This is from the oxygen used during your procedure.  There is no need for concern and it should clear up in a day or so.  SYMPTOMS TO REPORT IMMEDIATELY:   Following lower endoscopy (colonoscopy or flexible sigmoidoscopy):  Excessive amounts of blood in the stool  Significant tenderness or worsening of abdominal pains  Swelling of the abdomen that is new, acute  Fever of 100F or higher    For urgent or emergent issues, a gastroenterologist can be reached at any hour by calling [phone]. Do not use MyChart messaging for urgent concerns.    DIET:  We do recommend a small meal at first, but then you may proceed to your regular diet.  Drink plenty of fluids but you should avoid alcoholic beverages for 24  hours.  ACTIVITY:  You should plan to take it easy for the rest of today and you should NOT DRIVE or use heavy machinery until tomorrow (because of the sedation medicines used during the test).    FOLLOW UP: Our staff will call the number listed on your records 48-72 hours following your procedure to check on you and address any questions or concerns that you may have regarding the information given to you following your procedure. If we do not reach you, we will leave a message.  We will attempt to reach you two times.  During this call, we will ask if you have developed any symptoms of COVID 19. If you develop any symptoms (ie: fever, flu-like symptoms, shortness of breath, cough etc.) before then, please call [phone].  If you test positive for Covid 19 in the 2 weeks post procedure, please call and report this information to us.    If any biopsies were taken you will be contacted by phone or by letter within the next 1-3 weeks.  Please call us at [phone] if you have not heard about the biopsies in 3 weeks.    SIGNATURES/CONFIDENTIALITY: You and/or your care partner have signed paperwork which will be entered into your electronic medical record.  These signatures attest to the fact that that the information above on your After Visit Summary has been reviewed and is understood.  Full responsibility of the confidentiality of this discharge information lies with you   and/or your care-partner. 

## 2021-03-14 NOTE — Progress Notes (Signed)
VS completed by [place].   Pt's states no medical or surgical changes since previsit or office visit.  

## 2021-03-16 ENCOUNTER — Telehealth: Payer: Self-pay

## 2021-03-16 ENCOUNTER — Telehealth: Payer: Self-pay | Admitting: *Deleted

## 2021-03-16 NOTE — Telephone Encounter (Signed)
  Follow up Call-  Call back number 03/14/2021 11/15/2020  Post procedure Call Back phone  # [phone]  Permission to leave phone message Yes Yes  Some recent data might be hidden     Patient questions:  Do you have a fever, pain , or abdominal swelling? No. Pain Score  0 *  Have you tolerated food without any problems? Yes.    Have you been able to return to your normal activities? Yes.    Do you have any questions about your discharge instructions: Diet   No. Medications  No. Follow up visit  No.  Do you have questions or concerns about your Care? No.  Actions: * If pain score is 4 or above: No action needed, pain <4.  Have you developed a fever since your procedure? no  2.   Have you had an respiratory symptoms (SOB or cough) since your procedure? no  3.   Have you tested positive for COVID 19 since your procedure no  4.   Have you had any family members/close contacts diagnosed with the COVID 19 since your procedure?  no   If yes to any of these questions please route to Joylene John, RN and Joella Prince, RN

## 2021-03-16 NOTE — Telephone Encounter (Signed)
Left message on follow up call. 

## 2021-03-27 ENCOUNTER — Encounter: Payer: Self-pay | Admitting: Gastroenterology

## 2021-04-13 ENCOUNTER — Other Ambulatory Visit: Payer: Medicare HMO

## 2021-04-18 ENCOUNTER — Encounter: Payer: Self-pay | Admitting: Family Medicine

## 2021-04-18 DIAGNOSIS — R7989 Other specified abnormal findings of blood chemistry: Secondary | ICD-10-CM

## 2021-04-18 DIAGNOSIS — R7309 Other abnormal glucose: Secondary | ICD-10-CM

## 2021-04-18 DIAGNOSIS — I10 Essential (primary) hypertension: Secondary | ICD-10-CM

## 2021-04-20 ENCOUNTER — Encounter: Payer: Medicare HMO | Admitting: Family Medicine

## 2021-04-23 ENCOUNTER — Other Ambulatory Visit: Payer: Self-pay | Admitting: Family Medicine

## 2021-04-23 NOTE — Telephone Encounter (Signed)
CPE is on 05/24/21, last filled on 04/26/21 #90 tabs with 3 refills

## 2021-05-03 ENCOUNTER — Other Ambulatory Visit: Payer: Medicare HMO

## 2021-05-10 ENCOUNTER — Encounter: Payer: Medicare HMO | Admitting: Family Medicine

## 2021-05-18 ENCOUNTER — Other Ambulatory Visit (INDEPENDENT_AMBULATORY_CARE_PROVIDER_SITE_OTHER): Payer: Medicare HMO

## 2021-05-18 ENCOUNTER — Other Ambulatory Visit: Payer: Self-pay

## 2021-05-18 DIAGNOSIS — R7309 Other abnormal glucose: Secondary | ICD-10-CM | POA: Diagnosis not present

## 2021-05-18 DIAGNOSIS — R7989 Other specified abnormal findings of blood chemistry: Secondary | ICD-10-CM | POA: Diagnosis not present

## 2021-05-18 DIAGNOSIS — I10 Essential (primary) hypertension: Secondary | ICD-10-CM

## 2021-05-18 LAB — CBC WITH DIFFERENTIAL/PLATELET
Basophils Absolute: 0 10*3/uL (ref 0.0–0.1)
Basophils Relative: 0.2 % (ref 0.0–3.0)
Eosinophils Absolute: 0 10*3/uL (ref 0.0–0.7)
Eosinophils Relative: 0.2 % (ref 0.0–5.0)
HCT: 40.1 % (ref 36.0–46.0)
Hemoglobin: 13.3 g/dL (ref 12.0–15.0)
Lymphocytes Relative: 6.1 % — ABNORMAL LOW (ref 12.0–46.0)
Lymphs Abs: 0.8 10*3/uL (ref 0.7–4.0)
MCHC: 33.3 g/dL (ref 30.0–36.0)
MCV: 90.8 fl (ref 78.0–100.0)
Monocytes Absolute: 0.6 10*3/uL (ref 0.1–1.0)
Monocytes Relative: 4.4 % (ref 3.0–12.0)
Neutro Abs: 11.5 10*3/uL — ABNORMAL HIGH (ref 1.4–7.7)
Neutrophils Relative %: 89.1 % — ABNORMAL HIGH (ref 43.0–77.0)
Platelets: 180 10*3/uL (ref 150.0–400.0)
RBC: 4.41 Mil/uL (ref 3.87–5.11)
RDW: 14 % (ref 11.5–15.5)
WBC: 12.9 10*3/uL — ABNORMAL HIGH (ref 4.0–10.5)

## 2021-05-18 LAB — COMPREHENSIVE METABOLIC PANEL
ALT: 18 U/L (ref 0–35)
AST: 19 U/L (ref 0–37)
Albumin: 4.1 g/dL (ref 3.5–5.2)
Alkaline Phosphatase: 55 U/L (ref 39–117)
BUN: 20 mg/dL (ref 6–23)
CO2: 33 mEq/L — ABNORMAL HIGH (ref 19–32)
Calcium: 9.4 mg/dL (ref 8.4–10.5)
Chloride: 101 mEq/L (ref 96–112)
Creatinine, Ser: 0.7 mg/dL (ref 0.40–1.20)
GFR: 83.02 mL/min (ref >60.00)
Glucose, Bld: 119 mg/dL — ABNORMAL HIGH (ref 70–99)
Potassium: 3.5 mEq/L (ref 3.5–5.1)
Sodium: 141 mEq/L (ref 135–145)
Total Bilirubin: 0.9 mg/dL (ref 0.2–1.2)
Total Protein: 6.3 g/dL (ref 6.0–8.3)

## 2021-05-18 LAB — LIPID PANEL
Cholesterol: 136 mg/dL (ref 0–200)
HDL: 61.8 mg/dL (ref >39.00)
LDL Cholesterol: 62 mg/dL (ref 0–99)
NonHDL: 74.3
Total CHOL/HDL Ratio: 2
Triglycerides: 60 mg/dL (ref 0.0–149.0)
VLDL: 12 mg/dL (ref 0.0–40.0)

## 2021-05-18 LAB — HEMOGLOBIN A1C: Hgb A1c MFr Bld: 5.6 % (ref 4.6–6.5)

## 2021-05-18 LAB — TSH: TSH: 1.44 u[IU]/mL (ref 0.35–5.50)

## 2021-05-19 LAB — PROLACTIN: Prolactin: 15.1 ng/mL

## 2021-05-24 ENCOUNTER — Ambulatory Visit (INDEPENDENT_AMBULATORY_CARE_PROVIDER_SITE_OTHER): Payer: Medicare HMO | Admitting: Family Medicine

## 2021-05-24 ENCOUNTER — Other Ambulatory Visit: Payer: Self-pay

## 2021-05-24 ENCOUNTER — Encounter: Payer: Self-pay | Admitting: Family Medicine

## 2021-05-24 VITALS — BP 134/80 | HR 66 | Temp 97.6°F | Ht 63.25 in | Wt 144.4 lb

## 2021-05-24 DIAGNOSIS — K219 Gastro-esophageal reflux disease without esophagitis: Secondary | ICD-10-CM | POA: Diagnosis not present

## 2021-05-24 DIAGNOSIS — I10 Essential (primary) hypertension: Secondary | ICD-10-CM | POA: Diagnosis not present

## 2021-05-24 DIAGNOSIS — Z0000 Encounter for general adult medical examination without abnormal findings: Secondary | ICD-10-CM

## 2021-05-24 DIAGNOSIS — M8580 Other specified disorders of bone density and structure, unspecified site: Secondary | ICD-10-CM

## 2021-05-24 DIAGNOSIS — Z79899 Other long term (current) drug therapy: Secondary | ICD-10-CM | POA: Diagnosis not present

## 2021-05-24 DIAGNOSIS — R7309 Other abnormal glucose: Secondary | ICD-10-CM

## 2021-05-24 DIAGNOSIS — E7800 Pure hypercholesterolemia, unspecified: Secondary | ICD-10-CM

## 2021-05-24 DIAGNOSIS — R7989 Other specified abnormal findings of blood chemistry: Secondary | ICD-10-CM

## 2021-05-24 MED ORDER — PANTOPRAZOLE SODIUM 40 MG PO TBEC: 40.0000 mg | DELAYED_RELEASE_TABLET | Freq: Every day | ORAL | 3 refills | Status: DC

## 2021-05-24 MED ORDER — ROSUVASTATIN CALCIUM 10 MG PO TABS: 10.0000 mg | ORAL_TABLET | Freq: Every day | ORAL | 3 refills | Status: DC

## 2021-05-24 MED ORDER — CHLORTHALIDONE 25 MG PO TABS: 12.5000 mg | ORAL_TABLET | Freq: Every day | ORAL | 3 refills | Status: DC

## 2021-05-24 MED ORDER — CARVEDILOL 12.5 MG PO TABS: 12.5000 mg | ORAL_TABLET | Freq: Two times a day (BID) | ORAL | 3 refills | Status: DC

## 2021-05-24 NOTE — Assessment & Plan Note (Signed)
bp in fair control at this time  BP Readings from Last 1 Encounters:  05/24/21 134/80   No changes needed Most recent labs reviewed  Disc lifstyle change with low sodium diet and exercise  Plan to continue Cored 12.5 mg bid Chlorthalidone 12.5 mg half pill daily

## 2021-05-24 NOTE — Assessment & Plan Note (Signed)
Lab Results  Component Value Date   HGBA1C 5.6 05/18/2021   Good weight loss effort  disc imp of low glycemic diet and wt loss to prevent DM2

## 2021-05-24 NOTE — Assessment & Plan Note (Signed)
dexa 01/2021 Osteopenia Taking ppi On ca and D Walking  No falls or fractures

## 2021-05-24 NOTE — Assessment & Plan Note (Signed)
Disc goals for lipids and reasons to control them Rev last labs with pt Rev low sat fat diet in detail crestor 10 mg daily  Diet is good and LDL under 70

## 2021-05-24 NOTE — Patient Instructions (Addendum)
Take care of yourself  Eat smart  Drink your water  Keep walking

## 2021-05-24 NOTE — Assessment & Plan Note (Signed)
Reviewed health habits including diet and exercise and skin cancer prevention Reviewed appropriate screening tests for age  Also reviewed health mt list, fam hx and immunization status , as well as social and family history   See HPI Labs reviewed  utd imms  utd mammogram utd colonoscopy -may or may not choose to repeat in 3 y dexa 01/2021 osteopenia No falls or fractures, did some PT for balance and good exercise Dermatology routine visit upcoming

## 2021-05-24 NOTE — Progress Notes (Signed)
Subjective:    Patient ID: Nicole Suarez, female    DOB: 1940/12/27, 80 y.o.   MRN: [id number]  This visit occurred during the SARS-CoV-2 public health emergency.  Safety protocols were in place, including screening questions prior to the visit, additional usage of staff PPE, and extensive cleaning of exam room while observing appropriate contact time as indicated for disinfecting solutions.   HPI Here for health maintenance exam and to review chronic medical problems    Has amw scheduled next week   Wt Readings from Last 3 Encounters:  05/24/21 144 lb 6 oz (65.5 kg)  03/14/21 156 lb (70.8 kg)  02/28/21 156 lb (70.8 kg)   25.37 kg/m  Eating better- optavia  Working very well for her  She is putting some more regular food  Walking for exercise  Husband did it also   Hanging in there  Considering a move, husband is falling more  She is stuck at home with husband   Going to [place] in [place] excited   Td 01/2013 Had shingrix vaccines  Pna vaccine utd Flu shot -sept  Covid immunized   Mammogram 01/22/2021 Self breast exam no lumps or changes   Colon cancer screen : colonoscopy 02/2021, could do recall in 3 y if desired for polyps   Dexa 01/2021 Osteopenia Falls - none  (balance is not as good) , so did some PT  Fractures-none Supplements ca and D  Exercise -walking   Dermatology appt is next month   HTN bp is stable today  No cp or palpitations or headaches or edema  No side effects to medicines  BP Readings from Last 3 Encounters:  05/24/21 134/80  03/14/21 (!) 145/64  11/15/20 (!) 175/73    Coreg 12.5 mg bid  Chlorthalidone 12.5 mg 1/2 pill daily  GERD Protonix 40 mg daily   Hyperlipidemia  Lab Results  Component Value Date   CHOL 136 05/18/2021   CHOL 166 04/20/2020   Lab Results  Component Value Date   HDL 61.80 05/18/2021   HDL 61.60 04/20/2020   Lab Results  Component Value Date   LDLCALC 62 05/18/2021   [place] 85 04/20/2020   Lab  Results  Component Value Date   TRIG 60.0 05/18/2021   TRIG 97.0 04/20/2020   Lab Results  Component Value Date   CHOLHDL 2 05/18/2021   CHOLHDL 3 04/20/2020   No results found for: LDLDIRECT Crestor 10 mg daily  Diet is good/cut out fatty foods and desserts   Prediabetes Lab Results  Component Value Date   HGBA1C 5.6 05/18/2021    H/o elevated prolactin Level of 15.1   Urology f/u- [facility] had botox Then urinary retention and then uti  Has to self cath twice daily  Should not be too much longer  Its is a lot to handle    Elevated wbc Lab Results  Component Value Date   WBC 12.9 (H) 05/18/2021   HGB 13.3 05/18/2021   HCT 40.1 05/18/2021   MCV 90.8 05/18/2021   PLT 180.0 05/18/2021  Just had uti  Lab Results  Component Value Date   TSH 1.44 05/18/2021     Patient Active Problem List   Diagnosis Date Noted   Current use of proton pump inhibitor 05/24/2021   Low back pain 09/13/2020   Groin pain, left 09/13/2020   Routine general medical examination at a health care facility 04/26/2020   Screening mammogram, encounter for 04/26/2020   Estrogen deficiency 04/26/2020   Elevated  glucose level 04/26/2020   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   GERD (gastroesophageal reflux disease) 07/26/2019   Essential hypertension 07/26/2019   Hyperlipidemia 07/26/2019   Allergic rhinitis 07/26/2019   Caregiver stress 07/26/2019   Urge incontinence 07/26/2019   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   RLS (restless legs syndrome) 07/26/2019   Colon polyp 07/26/2019   Osteopenia 07/26/2019   History of Mohs micrographic surgery for skin cancer 07/26/2019   Past Medical History:  Diagnosis Date   Arthritis    Cataract    bilateral lens implants   GERD (gastroesophageal reflux disease)    History of allergy    History of blood transfusion    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    History of urinary  incontinence    History of UTI    Hyperlipidemia    Hypertension    Neuromuscular disorder (HCC)    restless legs    Past Surgical History:  Procedure Laterality Date   APPENDECTOMY  1963   CATARACT EXTRACTION, BILATERAL     08/2019, 09/2019   CESAREAN SECTION  1964   MICRODISCECTOMY LUMBAR     L3L4   RECTOCELE REPAIR  2009   REPLACEMENT TOTAL KNEE  2010   ROTATOR CUFF REPAIR  2000   urterine suspension  1971   Social History   Tobacco Use   Smoking status: Never   Smokeless tobacco: Never  Vaping Use   Vaping Use: Never used  Substance Use Topics   Alcohol use: Never   Drug use: Not Currently   Family History  Problem Relation Age of Onset   Early death Mother    Cancer Father    COPD Father    Lung cancer Father    Pancreatic cancer Sister    Alcohol abuse Brother    Heart disease Brother    Hypertension Brother    COPD Maternal Grandmother    Colon cancer Neg Hx    Esophageal cancer Neg Hx    Stomach cancer Neg Hx    Rectal cancer Neg Hx    Colon polyps Neg Hx    Allergies  Allergen Reactions   Ace Inhibitors Other (See Comments)    THROAT SWELLING   Requip [Ropinirole]     Vision change    Shrimp [Shellfish Allergy] Nausea And Vomiting   Aleve [Naproxen] Rash   Current Outpatient Medications on File Prior to Visit  Medication Sig Dispense Refill   bromocriptine (PARLODEL) 2.5 MG tablet TAKE 1 TABLET DAILY 90 tablet 0   Calcium Carbonate-Vit D-Min (CALCIUM 1200 PO) Take 2 capsules by mouth daily.     Docusate Sodium (COLACE PO) Take by mouth.     fluticasone (FLONASE) 50 MCG/ACT nasal spray Place 1-2 sprays into both nostrils daily.     ibuprofen (ADVIL) 400 MG tablet Take 400 mg by mouth every 8 (eight) hours as needed.     levocetirizine (XYZAL) 5 MG tablet Take 5 mg by mouth every evening.     Multiple Vitamin (MULTIVITAMIN) capsule Take 1 capsule by mouth daily.     Multiple Vitamins-Minerals (PRESERVISION AREDS 2 PO) Take 2 capsules by mouth  daily.     NON FORMULARY SEVINA: 2 tablets daily     SENNA PO Take by mouth.     VITAMIN D PO Take 1 capsule by mouth daily.     Wheat Dextrin (BENEFIBER PO) Take by mouth daily.     No current facility-administered medications on file  prior to visit.    Review of Systems  Constitutional:  Negative for activity change, appetite change, fatigue, fever and unexpected weight change.  HENT:  Negative for congestion, ear pain, rhinorrhea, sinus pressure and sore throat.   Eyes:  Negative for pain, redness and visual disturbance.  Respiratory:  Negative for cough, shortness of breath and wheezing.   Cardiovascular:  Negative for chest pain and palpitations.  Gastrointestinal:  Negative for abdominal pain, blood in stool, constipation and diarrhea.  Endocrine: Negative for polydipsia and polyuria.  Genitourinary:  Negative for dysuria, frequency and urgency.  Musculoskeletal:  Negative for arthralgias, back pain and myalgias.  Skin:  Negative for pallor and rash.  Allergic/Immunologic: Negative for environmental allergies.  Neurological:  Negative for dizziness, syncope and headaches.  Hematological:  Negative for adenopathy. Does not bruise/bleed easily.  Psychiatric/Behavioral:  Negative for decreased concentration and dysphoric mood. The patient is not nervous/anxious.        Stressors  Caregiving       Objective:   Physical Exam Constitutional:      General: She is not in acute distress.    Appearance: Normal appearance. She is well-developed and normal weight. She is not ill-appearing or diaphoretic.  HENT:     Head: Normocephalic and atraumatic.     Right Ear: Tympanic membrane, ear canal and external ear normal.     Left Ear: Tympanic membrane, ear canal and external ear normal.     Nose: Nose normal. No congestion.     Mouth/Throat:     Mouth: Mucous membranes are moist.     Pharynx: Oropharynx is clear. No posterior oropharyngeal erythema.  Eyes:     General: No scleral  icterus.    Extraocular Movements: Extraocular movements intact.     Conjunctiva/sclera: Conjunctivae normal.     Pupils: Pupils are equal, round, and reactive to light.  Neck:     Thyroid: No thyromegaly.     Vascular: No carotid bruit or JVD.  Cardiovascular:     Rate and Rhythm: Normal rate and regular rhythm.     Pulses: Normal pulses.     Heart sounds: Normal heart sounds.    No gallop.  Pulmonary:     Effort: Pulmonary effort is normal. No respiratory distress.     Breath sounds: Normal breath sounds. No wheezing.     Comments: Good air exch Chest:     Chest wall: No tenderness.  Abdominal:     General: Bowel sounds are normal. There is no distension or abdominal bruit.     Palpations: Abdomen is soft. There is no mass.     Tenderness: There is no abdominal tenderness.     Hernia: No hernia is present.  Genitourinary:    Comments: Breast exam: No mass, nodules, thickening, tenderness, bulging, retraction, inflamation, nipple discharge or skin changes noted.  No axillary or clavicular LA.     Musculoskeletal:        General: No tenderness. Normal range of motion.     Cervical back: Normal range of motion and neck supple. No rigidity. No muscular tenderness.     Right lower leg: No edema.     Left lower leg: No edema.     Comments: No kyphosis   Lymphadenopathy:     Cervical: No cervical adenopathy.  Skin:    General: Skin is warm and dry.     Coloration: Skin is not pale.     Findings: No erythema or rash.  Comments: Solar lentigines diffusely   Neurological:     Mental Status: She is alert. Mental status is at baseline.     Cranial Nerves: No cranial nerve deficit.     Motor: No abnormal muscle tone.     Coordination: Coordination normal.     Gait: Gait normal.     Deep Tendon Reflexes: Reflexes are normal and symmetric. Reflexes normal.  Psychiatric:        Mood and Affect: Mood normal.        Cognition and Memory: Cognition and memory normal.           Assessment & Plan:   Problem List Items Addressed This Visit       Cardiovascular and Mediastinum   Essential hypertension    bp in fair control at this time  BP Readings from Last 1 Encounters:  05/24/21 134/80  No changes needed Most recent labs reviewed  Disc lifstyle change with low sodium diet and exercise  Plan to continue Cored 12.5 mg bid Chlorthalidone 12.5 mg half pill daily       Relevant Medications   rosuvastatin (CRESTOR) 10 MG tablet   carvedilol (COREG) 12.5 MG tablet   chlorthalidone (HYGROTON) 25 MG tablet     Digestive   GERD (gastroesophageal reflux disease)    Taking protonix In good control       Relevant Medications   pantoprazole (PROTONIX) 40 MG tablet     Musculoskeletal and Integument   Osteopenia    dexa 01/2021 Osteopenia Taking ppi On ca and D Walking  No falls or fractures        Other   Hyperlipidemia    Disc goals for lipids and reasons to control them Rev last labs with pt Rev low sat fat diet in detail crestor 10 mg daily  Diet is good and LDL under 70       Relevant Medications   rosuvastatin (CRESTOR) 10 MG tablet   carvedilol (COREG) 12.5 MG tablet   chlorthalidone (HYGROTON) 25 MG tablet   Non-artifactual high serum prolactin    PL in nl range at 15.1       Routine general medical examination at a health care facility - Primary    Reviewed health habits including diet and exercise and skin cancer prevention Reviewed appropriate screening tests for age  Also reviewed health mt list, fam hx and immunization status , as well as social and family history   See HPI Labs reviewed  utd imms  utd mammogram utd colonoscopy -may or may not choose to repeat in 3 y dexa 01/2021 osteopenia No falls or fractures, did some PT for balance and good exercise Dermatology routine visit upcoming       Elevated glucose level    Lab Results  Component Value Date   HGBA1C 5.6 05/18/2021  Good weight loss effort  disc imp  of low glycemic diet and wt loss to prevent DM2       Current use of proton pump inhibitor

## 2021-05-24 NOTE — Assessment & Plan Note (Signed)
Taking protonix In good control

## 2021-05-24 NOTE — Assessment & Plan Note (Signed)
PL in nl range at 15.1

## 2021-05-28 NOTE — Progress Notes (Signed)
Subjective:   Nicole Suarez is a 80 y.o. female who presents for Medicare Annual (Subsequent) preventive examination.  I connected with [name] today by telephone and verified that I am speaking with the correct person using two identifiers. Location patient: home Location provider: work Persons participating in the virtual visit: patient, Marine scientist.    I discussed the limitations, risks, security and privacy concerns of performing an evaluation and management service by telephone and the availability of in person appointments. I also discussed with the patient that there may be a patient responsible charge related to this service. The patient expressed understanding and verbally consented to this telephonic visit.    Interactive audio and video telecommunications were attempted between this provider and patient, however failed, due to patient having technical difficulties OR patient did not have access to video capability.  We continued and completed visit with audio only.  Some vital signs may be absent or patient reported.   Time Spent with patient on telephone encounter: 25 minutes  Review of Systems     Cardiac Risk Factors include: advanced age (>69men, >54 women);dyslipidemia;hypertension     Objective:    Today's Vitals   05/30/21 0944  Weight: 144 lb (65.3 kg)  Height: 5\' 3"  (1.6 m)  PainSc: 4    Body mass index is 25.51 kg/m.  Advanced Directives 05/30/2021 05/26/2020  Does Patient Have a Medical Advance Directive? Yes Yes  Type of Paramedic of [place];Living will [place];Living will  Does patient want to make changes to medical advance directive? Yes (ED - Information included in AVS) -  Copy of [place] in Chart? - No - copy requested    Current Medications (verified) Outpatient Encounter Medications as of 05/30/2021  Medication Sig   bromocriptine (PARLODEL) 2.5 MG tablet TAKE 1 TABLET DAILY    Calcium Carbonate-Vit D-Min (CALCIUM 1200 PO) Take 2 capsules by mouth daily.   carvedilol (COREG) 12.5 MG tablet Take 1 tablet (12.5 mg total) by mouth 2 (two) times daily with a meal.   chlorthalidone (HYGROTON) 25 MG tablet Take 0.5 tablets (12.5 mg total) by mouth daily.   Docusate Sodium (COLACE PO) Take by mouth.   fluticasone (FLONASE) 50 MCG/ACT nasal spray Place 1-2 sprays into both nostrils daily.   ibuprofen (ADVIL) 400 MG tablet Take 400 mg by mouth every 8 (eight) hours as needed.   levocetirizine (XYZAL) 5 MG tablet Take 5 mg by mouth every evening.   Multiple Vitamin (MULTIVITAMIN) capsule Take 1 capsule by mouth daily.   Multiple Vitamins-Minerals (PRESERVISION AREDS 2 PO) Take 2 capsules by mouth daily.   NON FORMULARY SEVINA: 2 tablets daily   pantoprazole (PROTONIX) 40 MG tablet Take 1 tablet (40 mg total) by mouth daily.   rosuvastatin (CRESTOR) 10 MG tablet Take 1 tablet (10 mg total) by mouth at bedtime.   SENNA PO Take by mouth.   VITAMIN D PO Take 1 capsule by mouth daily.   Wheat Dextrin (BENEFIBER PO) Take by mouth daily.   No facility-administered encounter medications on file as of 05/30/2021.    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  History of urinary incontinence    History of UTI    Hyperlipidemia    Hypertension    Neuromuscular disorder (HCC)    restless legs    Past Surgical History:  Procedure Laterality Date   APPENDECTOMY  1963   CATARACT EXTRACTION, BILATERAL     08/2019, 09/2019   CESAREAN SECTION  1964   MICRODISCECTOMY LUMBAR     L3L4   RECTOCELE REPAIR  2009   REPLACEMENT TOTAL KNEE  2010    ROTATOR CUFF REPAIR  2000   urterine suspension  1971   Family History  Problem Relation Age of Onset   Early death Mother    Cancer Father    COPD Father    Lung cancer Father    Pancreatic cancer Sister    Alcohol abuse Brother    Heart disease Brother    Hypertension Brother    COPD Maternal Grandmother    Colon cancer Neg Hx    Esophageal cancer Neg Hx    Stomach cancer Neg Hx    Rectal cancer Neg Hx    Colon polyps Neg Hx    Social History   Socioeconomic History   Marital status: Married    Spouse name: Not on file   Number of children: Not on file   Years of education: Not on file   Highest education level: Not on file  Occupational History   Occupation: retired Therapist, sports  Tobacco Use   Smoking status: Never   Smokeless tobacco: Never  Vaping Use   Vaping Use: Never used  Substance and Sexual Activity   Alcohol use: Never   Drug use: Not Currently   Sexual activity: Not on file  Other Topics Concern   Not on file  Social History Narrative   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    Moved her from [place] in 2020   Cares for disabled husband   Lives in [place] Determinants of Health   Financial Resource Strain: Low Risk    Difficulty of Paying Living Expenses: Not hard at all  Food Insecurity: No Food Insecurity   Worried About Charity fundraiser in the Last Year: Never true   Arboriculturist in the Last Year: Never true  Transportation Needs: No Transportation Needs   Lack of Transportation (Medical): No   Lack of Transportation (Non-Medical): No  Physical Activity: Insufficiently Active   Days of Exercise per Week: 7 days   Minutes of Exercise per Session: 20 min  Stress: No Stress Concern Present   Feeling of Stress : Only a little  Social Connections: Moderately Integrated   Frequency of Communication with Friends and Family: More than three times a week   Frequency of Social Gatherings with Friends and Family: More than three times a week    Attends Religious Services: More than 4 times per year   Active Member of [employer] or Organizations: No   Attends Music therapist: Never   Marital Status: Married    Tobacco Counseling Counseling given: Not Answered   Clinical Intake:  Pre-visit preparation completed: Yes  Pain : 0-10 Pain Score: 4  Pain Location: Shoulder     BMI - recorded: 25.36 Nutritional Status: BMI 25 -29 Overweight Nutritional Risks: None Diabetes: No  How often do you need to have someone help you when you read instructions, pamphlets, or other written materials from your doctor or pharmacy?: 1 - Never  Diabetic?No  Interpreter  Needed?: No  Information entered by :: Orrin Brigham LPN   Activities of Daily Living In your present state of health, do you have any difficulty performing the following activities: 05/30/2021  Hearing? N  Vision? N  Difficulty concentrating or making decisions? N  Walking or climbing stairs? N  Dressing or bathing? N  Doing errands, shopping? N  Preparing Food and eating ? N  Using the Toilet? N  In the past six months, have you accidently leaked urine? Y  Do you have problems with loss of bowel control? N  Managing your Medications? N  Managing your Finances? N  Housekeeping or managing your Housekeeping? N  Some recent data might be hidden    Patient Care Team: Tower, Wynelle Fanny, MD as PCP - General (Family Medicine)  Indicate any recent Medical Services you may have received from other than [facility] providers in the past year (date may be approximate).     Assessment:   This is a routine wellness examination for Cheryl.  Hearing/Vision screen Hearing Screening - Comments:: No issues Vision Screening - Comments:: Last exam 2021, plan to make an appointment , Dr. Glennon Mac  Dietary issues and exercise activities discussed: Current Exercise Habits: Home exercise routine, Type of exercise: walking, Time (Minutes): 15, Frequency (Times/Week): 7,  Weekly Exercise (Minutes/Week): 105   Goals Addressed             This Visit's Progress    Patient Stated       Would like to maintain current weight and drinking more water.       Depression Screen PHQ 2/9 Scores 05/30/2021 05/26/2020 07/26/2019  PHQ - 2 Score 0 0 0  PHQ- 9 Score - 0 1    Fall Risk Fall Risk  05/30/2021 05/26/2020  Falls in the past year? 0 0  Number falls in past yr: 0 0  Injury with Fall? 0 0  Risk for fall due to : No Fall Risks Medication side effect  Follow up Falls prevention discussed Falls evaluation completed;Falls prevention discussed    FALL RISK PREVENTION PERTAINING TO THE HOME:  Any stairs in or around the home? Yes  If so, are there any without handrails? No  Home free of loose throw rugs in walkways, pet beds, electrical cords, etc? Yes  Adequate lighting in your home to reduce risk of falls? Yes   ASSISTIVE DEVICES UTILIZED TO PREVENT FALLS:  Life alert? No  Use of a cane, walker or w/c? No  Grab bars in the bathroom? Yes  Shower chair or bench in shower? Yes  Elevated toilet seat or a handicapped toilet? Yes   TIMED UP AND GO:  Was the test performed? No , visit completed over the phone.   Cognitive Function: Normal cognitive status assessed by this Nurse Health Advisor. No abnormalities found.   MMSE - Mini Mental State Exam 05/26/2020  Orientation to time 5  Orientation to Place 5  Registration 3  Attention/ Calculation 5  Recall 3  Language- repeat 1        Immunizations Immunization History  Administered Date(s) Administered   Influenza, High Dose Seasonal PF 03/28/2020, 03/28/2021   Influenza-Unspecified 04/04/2019   PFIZER Comirnaty(Gray Top)Covid-19 Tri-Sucrose Vaccine 10/22/2020   PFIZER(Purple Top)SARS-COV-2 Vaccination 09/03/2019, 09/24/2019, 04/28/2020   Pfizer Covid-19 Vaccine Bivalent Booster 48yrs & up 03/28/2021   Pneumococcal Conjugate-13 11/30/2014   Pneumococcal Polysaccharide-23 03/28/2020    Td 01/12/2013   Zoster Recombinat (Shingrix) 11/11/2016, 03/26/2017    TDAP status: Up  to date  Flu Vaccine status: Up to date  Pneumococcal vaccine status: Up to date  Covid-19 vaccine status: Completed vaccines  Qualifies for Shingles Vaccine? Yes   Zostavax completed No   Shingrix Completed?: Yes  Screening Tests Health Maintenance  Topic Date Due   Hepatitis C Screening  Never done   MAMMOGRAM  01/22/2022   TETANUS/TDAP  01/13/2023   Pneumonia Vaccine 65+ Years old  Completed   INFLUENZA VACCINE  Completed   DEXA SCAN  Completed   COVID-19 Vaccine  Completed   Zoster Vaccines- Shingrix  Completed   HPV VACCINES  Aged Out    Health Maintenance  Health Maintenance Due  Topic Date Due   Hepatitis C Screening  Never done    Colorectal cancer screening: No longer required.   Mammogram status: Completed 01/22/21. Repeat every year  Bone Density status: Completed 01/22/21. Results reflect: Bone density results: OSTEOPENIA. Repeat every 2 years.  Lung Cancer Screening: (Low Dose CT Chest recommended if Age 37-80 years, 30 pack-year currently smoking OR have quit w/in 15years.) does qualify.     Additional Screening:  Hepatitis C Screening: does not qualify;    Vision Screening: Recommended annual ophthalmology exams for early detection of glaucoma and other disorders of the eye. Is the patient up to date with their annual eye exam?  No , patient plans to schedule appointment Who is the provider or what is the name of the office in which the patient attends annual eye exams? Dr. Glennon Mac   Dental Screening: Recommended annual dental exams for proper oral hygiene  Community Resource Referral / Chronic Care Management: CRR required this visit?  No   CCM required this visit?  No      Plan:     I have personally reviewed and noted the following in the patient's chart:   Medical and social history Use of alcohol, tobacco or illicit drugs  Current  medications and supplements including opioid prescriptions.  Functional ability and status Nutritional status Physical activity Advanced directives List of other physicians Hospitalizations, surgeries, and ER visits in previous 12 months Vitals Screenings to include cognitive, depression, and falls Referrals and appointments  In addition, I have reviewed and discussed with patient certain preventive protocols, quality metrics, and best practice recommendations. A written personalized care plan for preventive services as well as general preventive health recommendations were provided to patient.   Due to this being a telephonic visit, the after visit summary with patients personalized plan was offered to patient via mail or my-chart. Patient would like to access on my-chart.    Loma Messing, LPN  22/29/7989   Nurse Health Advisor  Nurse Notes: None

## 2021-05-30 ENCOUNTER — Ambulatory Visit (INDEPENDENT_AMBULATORY_CARE_PROVIDER_SITE_OTHER): Payer: Medicare HMO

## 2021-05-30 VITALS — Ht 63.0 in | Wt 144.0 lb

## 2021-05-30 DIAGNOSIS — Z0000 Encounter for general adult medical examination without abnormal findings: Secondary | ICD-10-CM

## 2021-05-30 NOTE — Patient Instructions (Addendum)
Nicole Suarez , Thank you for taking time to complete your Medicare Wellness Visit. I appreciate your ongoing commitment to your health goals. Please review the following plan we discussed and let me know if I can assist you in the future.   Screening recommendations/referrals: Colonoscopy: up to date, no longer required Mammogram: up to date, completed 01/22/21, due 01/22/22 Bone Density: up to date, completed 01/22/21, due 01/23/23 Recommended yearly ophthalmology/optometry visit for glaucoma screening and checkup Recommended yearly dental visit for hygiene and checkup  Vaccinations: Influenza vaccine: up to date Pneumococcal vaccine: up to date Tdap vaccine: up to date, completed 01/12/13, due 01/13/23 Shingles vaccine: up to date   Covid-19:up to date  Advanced directives: Please bring a copy of Living Will and/or [name] for your chart.   Conditions/risks identified: see problem list  Next appointment: Follow up in one year for your annual wellness visit    Preventive Care 65 Years and Older, Female Preventive care refers to lifestyle choices and visits with your health care provider that can promote health and wellness. What does preventive care include? A yearly physical exam. This is also called an annual well check. Dental exams once or twice a year. Routine eye exams. Ask your health care provider how often you should have your eyes checked. Personal lifestyle choices, including: Daily care of your teeth and gums. Regular physical activity. Eating a healthy diet. Avoiding tobacco and drug use. Limiting alcohol use. Practicing safe sex. Taking low-dose aspirin every day. Taking vitamin and mineral supplements as recommended by your health care provider. What happens during an annual well check? The services and screenings done by your health care provider during your annual well check will depend on your age, overall health, lifestyle risk factors, and family  history of disease. Counseling  Your health care provider may ask you questions about your: Alcohol use. Tobacco use. Drug use. Emotional well-being. Home and relationship well-being. Sexual activity. Eating habits. History of falls. Memory and ability to understand (cognition). Work and work Statistician. Reproductive health. Screening  You may have the following tests or measurements: Height, weight, and BMI. Blood pressure. Lipid and cholesterol levels. These may be checked every 5 years, or more frequently if you are over 21 years old. Skin check. Lung cancer screening. You may have this screening every year starting at age 62 if you have a 30-pack-year history of smoking and currently smoke or have quit within the past 15 years. Fecal occult blood test (FOBT) of the stool. You may have this test every year starting at age 107. Flexible sigmoidoscopy or colonoscopy. You may have a sigmoidoscopy every 5 years or a colonoscopy every 10 years starting at age 86. Hepatitis C blood test. Hepatitis B blood test. Sexually transmitted disease (STD) testing. Diabetes screening. This is done by checking your blood sugar (glucose) after you have not eaten for a while (fasting). You may have this done every 1-3 years. Bone density scan. This is done to screen for osteoporosis. You may have this done starting at age 67. Mammogram. This may be done every 1-2 years. Talk to your health care provider about how often you should have regular mammograms. Talk with your health care provider about your test results, treatment options, and if necessary, the need for more tests. Vaccines  Your health care provider may recommend certain vaccines, such as: Influenza vaccine. This is recommended every year. Tetanus, diphtheria, and acellular pertussis (Tdap, Td) vaccine. You may need a Td booster  every 10 years. Zoster vaccine. You may need this after age 56. Pneumococcal 13-valent conjugate (PCV13)  vaccine. One dose is recommended after age 41. Pneumococcal polysaccharide (PPSV23) vaccine. One dose is recommended after age 23. Talk to your health care provider about which screenings and vaccines you need and how often you need them. This information is not intended to replace advice given to you by your health care provider. Make sure you discuss any questions you have with your health care provider. Document Released: 07/28/2015 Document Revised: 03/20/2016 Document Reviewed: 05/02/2015 Elsevier Interactive Patient Education  2017 [place] Prevention in the Home Falls can cause injuries. They can happen to people of all ages. There are many things you can do to make your home safe and to help prevent falls. What can I do on the outside of my home? Regularly fix the edges of walkways and driveways and fix any cracks. Remove anything that might make you trip as you walk through a door, such as a raised step or threshold. Trim any bushes or trees on the path to your home. Use bright outdoor lighting. Clear any walking paths of anything that might make someone trip, such as rocks or tools. Regularly check to see if handrails are loose or broken. Make sure that both sides of any steps have handrails. Any raised decks and porches should have guardrails on the edges. Have any leaves, snow, or ice cleared regularly. Use sand or salt on walking paths during winter. Clean up any spills in your garage right away. This includes oil or grease spills. What can I do in the bathroom? Use night lights. Install grab bars by the toilet and in the tub and shower. Do not use towel bars as grab bars. Use non-skid mats or decals in the tub or shower. If you need to sit down in the shower, use a plastic, non-slip stool. Keep the floor dry. Clean up any water that spills on the floor as soon as it happens. Remove soap buildup in the tub or shower regularly. Attach bath mats securely with  double-sided non-slip rug tape. Do not have throw rugs and other things on the floor that can make you trip. What can I do in the bedroom? Use night lights. Make sure that you have a light by your bed that is easy to reach. Do not use any sheets or blankets that are too big for your bed. They should not hang down onto the floor. Have a firm chair that has side arms. You can use this for support while you get dressed. Do not have throw rugs and other things on the floor that can make you trip. What can I do in the kitchen? Clean up any spills right away. Avoid walking on wet floors. Keep items that you use a lot in easy-to-reach places. If you need to reach something above you, use a strong step stool that has a grab bar. Keep electrical cords out of the way. Do not use floor polish or wax that makes floors slippery. If you must use wax, use non-skid floor wax. Do not have throw rugs and other things on the floor that can make you trip. What can I do with my stairs? Do not leave any items on the stairs. Make sure that there are handrails on both sides of the stairs and use them. Fix handrails that are broken or loose. Make sure that handrails are as long as the stairways. Check any carpeting to  make sure that it is firmly attached to the stairs. Fix any carpet that is loose or worn. Avoid having throw rugs at the top or bottom of the stairs. If you do have throw rugs, attach them to the floor with carpet tape. Make sure that you have a light switch at the top of the stairs and the bottom of the stairs. If you do not have them, ask someone to add them for you. What else can I do to help prevent falls? Wear shoes that: Do not have high heels. Have rubber bottoms. Are comfortable and fit you well. Are closed at the toe. Do not wear sandals. If you use a stepladder: Make sure that it is fully opened. Do not climb a closed stepladder. Make sure that both sides of the stepladder are locked  into place. Ask someone to hold it for you, if possible. Clearly mark and make sure that you can see: Any grab bars or handrails. First and last steps. Where the edge of each step is. Use tools that help you move around (mobility aids) if they are needed. These include: Canes. Walkers. Scooters. Crutches. Turn on the lights when you go into a dark area. Replace any light bulbs as soon as they burn out. Set up your furniture so you have a clear path. Avoid moving your furniture around. If any of your floors are uneven, fix them. If there are any pets around you, be aware of where they are. Review your medicines with your doctor. Some medicines can make you feel dizzy. This can increase your chance of falling. Ask your doctor what other things that you can do to help prevent falls. This information is not intended to replace advice given to you by your health care provider. Make sure you discuss any questions you have with your health care provider. Document Released: 04/27/2009 Document Revised: 12/07/2015 Document Reviewed: 08/05/2014 Elsevier Interactive Patient Education  2017 [employer].

## 2021-07-20 ENCOUNTER — Other Ambulatory Visit: Payer: Self-pay | Admitting: Family Medicine

## 2021-07-20 NOTE — Telephone Encounter (Signed)
CPE was on 05/24/21, last filled on 04/23/21 #90 tabs with 0 refills

## 2021-08-15 ENCOUNTER — Encounter: Payer: Self-pay | Admitting: Dermatology

## 2021-08-15 ENCOUNTER — Ambulatory Visit: Payer: Medicare HMO | Admitting: Dermatology

## 2021-08-15 ENCOUNTER — Other Ambulatory Visit: Payer: Self-pay

## 2021-08-15 DIAGNOSIS — L578 Other skin changes due to chronic exposure to nonionizing radiation: Secondary | ICD-10-CM

## 2021-08-15 DIAGNOSIS — L570 Actinic keratosis: Secondary | ICD-10-CM

## 2021-08-15 DIAGNOSIS — Z85828 Personal history of other malignant neoplasm of skin: Secondary | ICD-10-CM

## 2021-08-15 DIAGNOSIS — L814 Other melanin hyperpigmentation: Secondary | ICD-10-CM

## 2021-08-15 DIAGNOSIS — Z1283 Encounter for screening for malignant neoplasm of skin: Secondary | ICD-10-CM

## 2021-08-15 DIAGNOSIS — D229 Melanocytic nevi, unspecified: Secondary | ICD-10-CM | POA: Diagnosis not present

## 2021-08-15 DIAGNOSIS — D1800 Hemangioma unspecified site: Secondary | ICD-10-CM

## 2021-08-15 DIAGNOSIS — L821 Other seborrheic keratosis: Secondary | ICD-10-CM

## 2021-08-15 NOTE — Progress Notes (Signed)
New Patient Visit  Subjective  Pennye Beeghly is a 81 y.o. female who presents for the following: New Patient (Initial Visit) (Patient here today as a new patient for tbse. 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 Patient also reports a history of precancers at face. She has signed release of information form and had records from prior dermatology faxed. They have been scanned into patient's chart.).  Patient here for full body skin exam and skin cancer screening.  The following portions of the chart were reviewed this encounter and updated as appropriate:   Tobacco   Allergies   Meds   Problems   Med Hx   Surg Hx   Fam Hx       Review of Systems:  No other skin or systemic complaints except as noted in HPI or Assessment and Plan.  Objective  Well appearing patient in no apparent distress; mood and affect are within normal limits.  A full examination was performed including scalp, head, eyes, ears, nose, lips, neck, chest, axillae, abdomen, back, buttocks, bilateral upper extremities, bilateral lower extremities, hands, feet, fingers, toes, fingernails, and toenails. All findings within normal limits unless otherwise noted below.  Right Nasal bridge x 1, left nasal sidewall x 1, left elbow x 1, left 3rd finger x 1 (4) Erythematous thin papules/macules with gritty scale.     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 They are chronic with expected duration over 1 year. A portion of actinic keratoses will progress to squamous cell carcinoma of the skin. It is not possible to reliably predict which spots will progress to skin cancer and so treatment is recommended to prevent development of skin cancer.  Recommend daily broad  spectrum sunscreen SPF 30+ to sun-exposed areas, reapply every 2 hours as needed.  Recommend staying in the shade or wearing long sleeves, sun glasses (UVA+UVB protection) and wide brim hats (4-inch brim around the entire circumference of the hat). Call for new or changing lesions.  Prior to procedure, discussed risks of blister formation, small wound, skin dyspigmentation, or rare scar following cryotherapy. Recommend Vaseline ointment to treated areas while healing.   Destruction of lesion - Right Nasal bridge x 1, left nasal sidewall x 1, left elbow x 1, left 3rd finger x 1  Destruction method: cryotherapy   Informed consent: discussed and consent obtained   Lesion destroyed using liquid nitrogen: Yes   Cryotherapy cycles:  2 Outcome: patient tolerated procedure well with no complications   Post-procedure details: wound care instructions given   Additional details:  Prior to procedure, discussed risks of blister formation, small wound, skin dyspigmentation, or rare scar following cryotherapy. Recommend Vaseline ointment to treated areas while healing.   Lentigines - Scattered tan macules - Due to sun exposure - Benign-appearing, observe - Recommend daily broad spectrum sunscreen SPF 30+ to sun-exposed areas, reapply every 2 hours as needed. - Call for any changes  Seborrheic Keratoses - Stuck-on, waxy, tan-brown papules and/or plaques  - Benign-appearing - Discussed benign etiology and prognosis. - Observe - Call for any changes  Melanocytic Nevi - Tan-brown and/or pink-flesh-colored symmetric macules and papules - Benign appearing on exam today - Observation - Call clinic for new or  changing moles - Recommend daily use of broad spectrum spf 30+ sunscreen to sun-exposed areas.   Hemangiomas - Red papules - Discussed benign nature - Observe - Call for any changes  Actinic Damage - Chronic condition, secondary to cumulative UV/sun exposure - diffuse scaly erythematous  macules with underlying dyspigmentation - Recommend daily broad spectrum sunscreen SPF 30+ to sun-exposed areas, reapply every 2 hours as needed.  - Staying in the shade or wearing long sleeves, sun glasses (UVA+UVB protection) and wide brim hats (4-inch brim around the entire circumference of the hat) are also recommended for sun protection.  - Call for new or changing lesions.  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  - Call if any new or changing lesions are noted between office visits  Skin cancer screening performed today. Return for 1 - 6 month ak follow up, 1 year tbse .  I, Ruthell Rummage, CMA, am acting as scribe for Forest Gleason, MD.  Documentation: I have reviewed the above documentation for accuracy and completeness, and I agree with the above.  Forest Gleason, MD

## 2021-08-15 NOTE — Patient Instructions (Addendum)
Actinic keratoses are precancerous spots that appear secondary to cumulative UV radiation exposure/sun exposure over time. They are chronic with expected duration over 1 year. A portion of actinic keratoses will progress to squamous cell carcinoma of the skin. It is not possible to reliably predict which spots will progress to skin cancer and so treatment is recommended to prevent development of skin cancer.  Recommend daily broad spectrum sunscreen SPF 30+ to sun-exposed areas, reapply every 2 hours as needed.  Recommend staying in the shade or wearing long sleeves, sun glasses (UVA+UVB protection) and wide brim hats (4-inch brim around the entire circumference of the hat). Call for new or changing lesions.   Cryotherapy Aftercare  Wash gently with soap and water everyday.   Apply Vaseline and Band-Aid daily until healed.    Recommend taking Heliocare sun protection supplement daily in sunny weather for additional sun protection. For maximum protection on the sunniest days, you can take up to 2 capsules of regular Heliocare OR take 1 capsule of Heliocare Ultra. For prolonged exposure (such as a full day in the sun), you can repeat your dose of the supplement 4 hours after your first dose. Heliocare can be purchased at [employer], at some [facility] or at [email or web address].     Melanoma ABCDEs  Melanoma is the most dangerous type of skin cancer, and is the leading cause of death from skin disease.  You are more likely to develop melanoma if you: Have light-colored skin, light-colored eyes, or red or blond hair Spend a lot of time in the sun Tan regularly, either outdoors or in a tanning bed Have had blistering sunburns, especially during childhood Have a close family member who has had a melanoma Have atypical moles or large birthmarks  Early detection of melanoma is key since treatment is typically straightforward and cure rates are extremely high if we catch it early.   The  first sign of melanoma is often a change in a mole or a new dark spot.  The ABCDE system is a way of remembering the signs of melanoma.  A for asymmetry:  The two halves do not match. B for border:  The edges of the growth are irregular. C for color:  A mixture of colors are present instead of an even brown color. D for diameter:  Melanomas are usually (but not always) greater than 27mm - the size of a pencil eraser. E for evolution:  The spot keeps changing in size, shape, and color.  Please check your skin once per month between visits. You can use a small mirror in front and a large mirror behind you to keep an eye on the back side or your body.   If you see any new or changing lesions before your next follow-up, please call to schedule a visit.  Please continue daily skin protection including broad spectrum sunscreen SPF 30+ to sun-exposed areas, reapplying every 2 hours as needed when you're outdoors.   Staying in the shade or wearing long sleeves, sun glasses (UVA+UVB protection) and wide brim hats (4-inch brim around the entire circumference of the hat) are also recommended for sun protection.    If You Need Anything After Your Visit  If you have any questions or concerns for your doctor, please call our main line at [phone] and press option 4 to reach your doctor's medical assistant. If no one answers, please leave a voicemail as directed and we will return your call as soon as possible. Messages left  after 4 pm will be answered the following business day.   You may also send [place] a message via [place]. We typically respond to MyChart messages within 1-2 business days.  For prescription refills, please ask your pharmacy to contact our office. Our fax number is [phone].  If you have an urgent issue when the clinic is closed that cannot wait until the next business day, you can page your doctor at the number below.    Please note that while we do our best to be available for  urgent issues outside of office hours, we are not available 24/7.   If you have an urgent issue and are unable to reach [place], you may choose to seek medical care at your doctor's office, retail clinic, urgent care center, or emergency room.  If you have a medical emergency, please immediately call 911 or go to the emergency department.  Pager Numbers  - Dr. Nehemiah Massed: [phone]  - Dr. Laurence Ferrari: [phone]  - Dr. Nicole Kindred: [phone]  In the event of inclement weather, please call our main line at [phone] for an update on the status of any delays or closures.  Dermatology Medication Tips: Please keep the boxes that topical medications come in in order to help keep track of the instructions about where and how to use these. Pharmacies typically print the medication instructions only on the boxes and not directly on the medication tubes.   If your medication is too expensive, please contact our office at [phone] option 4 or send [place] a message through [place].   We are unable to tell what your co-pay for medications will be in advance as this is different depending on your insurance coverage. However, we may be able to find a substitute medication at lower cost or fill out paperwork to get insurance to cover a needed medication.   If a prior authorization is required to get your medication covered by your insurance company, please allow [place] 1-2 business days to complete this process.  Drug prices often vary depending on where the prescription is filled and some pharmacies may offer cheaper prices.  The website www.goodrx.com contains coupons for medications through different pharmacies. The prices here do not account for what the cost may be with help from insurance (it may be cheaper with your insurance), but the website can give you the price if you did not use any insurance.  - You can print the associated coupon and take it with your prescription to the pharmacy.  - You may also  stop by our office during regular business hours and pick up a GoodRx coupon card.  - If you need your prescription sent electronically to a different pharmacy, notify our office through [facility] or by phone at [phone] option 4.     Si Usted Necesita Algo Despus de Su Visita  Tambin puede enviarnos un mensaje a travs de Pharmacist, community. Por lo general respondemos a los mensajes de MyChart en el transcurso de 1 a 2 das hbiles.  Para renovar recetas, por favor pida a su farmacia que se ponga en contacto con nuestra oficina. Harland Dingwall de fax es [place] [phone].  Si tiene un asunto urgente cuando la clnica est cerrada y que no puede esperar hasta el siguiente da hbil, puede llamar/localizar a su doctor(a) al nmero que aparece a continuacin.   Por favor, tenga en cuenta que aunque hacemos todo lo posible para estar disponibles para asuntos urgentes fuera del horario de oficina, no estamos disponibles  las 24 horas del da, los 7 das de la [place].   Si tiene un problema urgente y no puede comunicarse con nosotros, puede optar por buscar atencin mdica  en el consultorio de su doctor(a), en una clnica privada, en un centro de atencin urgente o en una sala de emergencias.  Si tiene Engineering geologist, por favor llame inmediatamente al 911 o vaya a la sala de emergencias.  Nmeros de bper  - Dr. Nehemiah Massed: [phone]  - Dra. Moye: [phone]  - Dra. Nicole Kindred: [phone]  En caso de inclemencias del [place], por favor llame a Johnsie Kindred principal al [phone] para una actualizacin sobre el [place] de cualquier retraso o cierre.  Consejos para la medicacin en dermatologa: Por favor, guarde las cajas en las que vienen los medicamentos de uso tpico para ayudarle a seguir las instrucciones sobre dnde y cmo usarlos. Las farmacias generalmente imprimen las instrucciones del medicamento slo en las cajas y no directamente en los tubos del [place].   Si  su medicamento es muy caro, por favor, pngase en contacto con Zigmund Daniel llamando al [phone] y presione la opcin 4 o envenos un mensaje a travs de Pharmacist, community.   No podemos decirle cul ser su copago por los medicamentos por adelantado ya que esto es diferente dependiendo de la cobertura de su seguro. Sin embargo, es posible que podamos encontrar un medicamento sustituto a Electrical engineer un formulario para que el seguro cubra el medicamento que se considera necesario.   Si se requiere una autorizacin previa para que su compaa de seguros [place] su medicamento, por favor permtanos de 1 a 2 das hbiles para completar este proceso.  Los precios de los medicamentos varan con frecuencia dependiendo del Environmental consultant de dnde se surte la receta y alguna farmacias pueden ofrecer precios ms baratos.  El sitio web www.goodrx.com tiene cupones para medicamentos de Airline pilot. Los precios aqu no tienen en cuenta lo que podra costar con la ayuda del seguro (puede ser ms barato con su seguro), pero el sitio web puede darle el precio si no utiliz Research scientist (physical sciences).  - Puede imprimir el cupn correspondiente y llevarlo con su receta a la farmacia.  - Tambin puede pasar por nuestra oficina durante el horario de atencin regular y Charity fundraiser una tarjeta de cupones de GoodRx.  - Si necesita que su receta se enve electrnicamente a una farmacia diferente, informe a nuestra oficina a travs de MyChart de [place] o por telfono llamando al [phone] y presione la opcin 4.

## 2021-08-16 ENCOUNTER — Encounter: Payer: Self-pay | Admitting: Dermatology

## 2021-09-12 ENCOUNTER — Ambulatory Visit: Payer: Medicare HMO | Admitting: Dermatology

## 2021-09-12 ENCOUNTER — Other Ambulatory Visit: Payer: Self-pay

## 2021-09-12 ENCOUNTER — Encounter: Payer: Self-pay | Admitting: Dermatology

## 2021-09-12 DIAGNOSIS — L570 Actinic keratosis: Secondary | ICD-10-CM | POA: Diagnosis not present

## 2021-09-12 DIAGNOSIS — Z872 Personal history of diseases of the skin and subcutaneous tissue: Secondary | ICD-10-CM

## 2021-09-12 NOTE — Progress Notes (Signed)
? ?  Follow-Up Visit ?  ?Subjective  ?Nicole Suarez is a 81 y.o. female who presents for the following: Follow-up (Patient here today concerning a rough spot at left eyebrow. Patient has history of aks. ). ? ? ?The following portions of the chart were reviewed this encounter and updated as appropriate:  Tobacco  Allergies  Meds  Problems  Med Hx  Surg Hx  Fam Hx   ?  ? ?Review of Systems: No other skin or systemic complaints except as noted in HPI or Assessment and Plan. ? ? ?Objective  ?Well appearing patient in no apparent distress; mood and affect are within normal limits. ? ?A focused examination was performed including face, left hand , left arm . Relevant physical exam findings are noted in the Assessment and Plan. ? ?Left Eyebrow x 1 ?Erythematous thin papules/macules with gritty scale.  ? ? ?Assessment & Plan  ?Actinic keratosis ?Left Eyebrow x 1 ? ?Actinic keratoses are precancerous spots that appear secondary to cumulative UV radiation exposure/sun exposure over time. They are chronic with expected duration over 1 year. A portion of actinic keratoses will progress to squamous cell carcinoma of the skin. It is not possible to reliably predict which spots will progress to skin cancer and so treatment is recommended to prevent development of skin cancer. ? ?Recommend daily broad spectrum sunscreen SPF 30+ to sun-exposed areas, reapply every 2 hours as needed.  ?Recommend staying in the shade or wearing long sleeves, sun glasses (UVA+UVB protection) and wide brim hats (4-inch brim around the entire circumference of the hat). ?Call for new or changing lesions. ? ?Destruction of lesion - Left Eyebrow x 1 ? ?Destruction method: cryotherapy   ?Informed consent: discussed and consent obtained   ?Lesion destroyed using liquid nitrogen: Yes   ?Cryotherapy cycles:  2 ?Outcome: patient tolerated procedure well with no complications   ?Post-procedure details: wound care instructions given   ?Additional details:   Prior to procedure, discussed risks of blister formation, small wound, skin dyspigmentation, or rare scar following cryotherapy. Recommend Vaseline ointment to treated areas while healing. ? ? ?History of PreCancerous Actinic Keratosis  ?- site(s) of PreCancerous Actinic Keratosis previously treated clear today. ?- these may recur and new lesions may form requiring treatment to prevent transformation into skin cancer ?- observe for new or changing spots and contact [place] for appointment if occur ?- photoprotection with sun protective clothing; sunglasses and broad spectrum sunscreen with SPF of at least 30 + and frequent self skin exams recommended ?- yearly exams by a dermatologist recommended for persons with history of PreCancerous Actinic Keratoses ? ?Return if symptoms worsen or fail to improve. ?I, Ruthell Rummage, CMA, am acting as scribe for Forest Gleason, MD. ? ?Documentation: I have reviewed the above documentation for accuracy and completeness, and I agree with the above. ? ?Forest Gleason, MD ? ? ?

## 2021-09-12 NOTE — Patient Instructions (Addendum)
Actinic keratoses are precancerous spots that appear secondary to cumulative UV radiation exposure/sun exposure over time. They are chronic with expected duration over 1 year. A portion of actinic keratoses will progress to squamous cell carcinoma of the skin. It is not possible to reliably predict which spots will progress to skin cancer and so treatment is recommended to prevent development of skin cancer. ? ?Recommend daily broad spectrum sunscreen SPF 30+ to sun-exposed areas, reapply every 2 hours as needed.  ?Recommend staying in the shade or wearing long sleeves, sun glasses (UVA+UVB protection) and wide brim hats (4-inch brim around the entire circumference of the hat). ?Call for new or changing lesions.  ? ?Cryotherapy Aftercare ? ?Wash gently with soap and water everyday.   ?Apply Vaseline and Band-Aid daily until healed.  ? ? ? ?If You Need Anything After Your Visit ? ?If you have any questions or concerns for your doctor, please call our main line at [phone] and press option 4 to reach your doctor's medical assistant. If no one answers, please leave a voicemail as directed and we will return your call as soon as possible. Messages left after 4 pm will be answered the following business day.  ? ?You may also send [place] a message via MyChart. We typically respond to MyChart messages within 1-2 business days. ? ?For prescription refills, please ask your pharmacy to contact our office. Our fax number is [phone]. ? ?If you have an urgent issue when the clinic is closed that cannot wait until the next business day, you can page your doctor at the number below.   ? ?Please note that while we do our best to be available for urgent issues outside of office hours, we are not available 24/7.  ? ?If you have an urgent issue and are unable to reach [place], you may choose to seek medical care at your doctor's office, retail clinic, urgent care center, or emergency room. ? ?If you have a medical emergency, please  immediately call 911 or go to the emergency department. ? ?Pager Numbers ? ?- Dr. Nehemiah Massed: [phone] ? ?- Dr. Laurence Ferrari: [phone] ? ?- Dr. Nicole Kindred: [phone] ? ?In the event of inclement weather, please call our main line at [phone] for an update on the status of any delays or closures. ? ?Dermatology Medication Tips: ?Please keep the boxes that topical medications come in in order to help keep track of the instructions about where and how to use these. Pharmacies typically print the medication instructions only on the boxes and not directly on the medication tubes.  ? ?If your medication is too expensive, please contact our office at [phone] option 4 or send [place] a message through [place].  ? ?We are unable to tell what your co-pay for medications will be in advance as this is different depending on your insurance coverage. However, we may be able to find a substitute medication at lower cost or fill out paperwork to get insurance to cover a needed medication.  ? ?If a prior authorization is required to get your medication covered by your insurance company, please allow [place] 1-2 business days to complete this process. ? ?Drug prices often vary depending on where the prescription is filled and some pharmacies may offer cheaper prices. ? ?The website www.goodrx.com contains coupons for medications through different pharmacies. The prices here do not account for what the cost may be with help from insurance (it may be cheaper with your insurance), but the website can give you the price  if you did not use any insurance.  ?- You can print the associated coupon and take it with your prescription to the pharmacy.  ?- You may also stop by our office during regular business hours and pick up a GoodRx coupon card.  ?- If you need your prescription sent electronically to a different pharmacy, notify our office through [facility] or by phone at [phone] option 4. ? ? ? ? ?Si Usted Necesita Algo Despu?s  de Su Visita ? ?Tambi?n puede enviarnos un mensaje a trav?s de MyChart. Por lo general respondemos a los mensajes de MyChart en el transcurso de 1 a 2 d?as h?biles. ? ?Para renovar recetas, por favor pida a su farmacia que se ponga en contacto con nuestra oficina. Nuestro n?mero de fax es el [phone]. ? ?Si tiene un asunto urgente cuando la cl?nica est? cerrada y que no puede esperar hasta el siguiente d?a h?bil, puede llamar/localizar a su doctor(a) al n?mero que aparece a continuaci?n.  ? ?Por favor, tenga en cuenta que aunque hacemos todo lo posible para estar disponibles para asuntos urgentes fuera del horario de oficina, no estamos disponibles las 24 horas del d?a, los 7 d?as de la semana.  ? ?Si tiene un problema urgente y no puede comunicarse con nosotros, puede optar por buscar atenci?n m?dica  en el consultorio de su doctor(a), en una cl?nica privada, en un centro de atenci?n urgente o en una sala de emergencias. ? ?Si tiene Engineer, maintenance (IT) m?dica, por favor llame inmediatamente al 911 o vaya a la sala de emergencias. ? ?N?meros de b?per ? ?- Dr. Nehemiah Massed: [phone] ? ?- Dra. Moye: [phone] ? ?- Dra. Nicole Kindred: [phone] ? ?En caso de inclemencias del tiempo, por favor llame a nuestra l?nea principal al [phone] para una actualizaci?n sobre el estado de cualquier retraso o cierre. ? ?Consejos para la medicaci?n en dermatolog?a: ?Por favor, guarde las cajas en las que vienen los medicamentos de uso t?pico para ayudarle a seguir las instrucciones sobre d?nde y c?mo usarlos. Las farmacias generalmente imprimen las instrucciones del medicamento s?lo en las cajas y no directamente en los tubos del [place].  ? ?Si su medicamento es muy caro, por favor, p?ngase en contacto con Zigmund Daniel llamando al [phone] y presione la opci?n 4 o env?enos un mensaje a trav?s de MyChart.  ? ?No podemos decirle cu?l ser? su copago por los medicamentos por adelantado ya que esto es diferente dependiendo  de la cobertura de su seguro. Sin embargo, es posible que podamos encontrar un medicamento sustituto a Electrical engineer un formulario para que el seguro cubra el medicamento que se considera necesario.  ? ?Si se requiere Ardelia Mems autorizaci?n previa para que su compa??a de seguros [place] su medicamento, por favor perm?tanos de 1 a 2 d?as h?biles para completar este proceso. ? ?Los precios de los medicamentos var?an con frecuencia dependiendo del Environmental consultant de d?nde se surte la receta y alguna farmacias pueden ofrecer precios m?s baratos. ? ?El sitio web www.goodrx.com tiene cupones para medicamentos de Airline pilot. Los precios aqu? no tienen en cuenta lo que podr?a costar con la ayuda del seguro (puede ser m?s barato con su seguro), pero el sitio web puede darle el precio si no utiliz? ning?n seguro.  ?- Puede imprimir el cup?n correspondiente y llevarlo con su receta a la farmacia.  ?- Tambi?n puede pasar por nuestra oficina durante el horario de atenci?n regular y recoger una tarjeta de cupones de GoodRx.  ?- Si necesita que su  receta se env?e electr?nicamente a Chiropodist, informe a nuestra oficina a trav?s de MyChart de [place] o por tel?fono llamando al [phone] y presione la opci?n 4. ? ?

## 2021-09-27 ENCOUNTER — Ambulatory Visit (INDEPENDENT_AMBULATORY_CARE_PROVIDER_SITE_OTHER): Payer: Medicare HMO | Admitting: Family Medicine

## 2021-09-27 ENCOUNTER — Encounter: Payer: Self-pay | Admitting: Family Medicine

## 2021-09-27 ENCOUNTER — Other Ambulatory Visit: Payer: Self-pay

## 2021-09-27 VITALS — BP 136/84 | HR 73 | Temp 97.8°F | Ht 63.25 in | Wt 147.0 lb

## 2021-09-27 DIAGNOSIS — M5442 Lumbago with sciatica, left side: Secondary | ICD-10-CM

## 2021-09-27 MED ORDER — PREDNISONE 10 MG PO TABS: ORAL_TABLET | ORAL | 0 refills | Status: DC

## 2021-09-27 MED ORDER — METHOCARBAMOL 500 MG PO TABS: 500.0000 mg | ORAL_TABLET | Freq: Three times a day (TID) | ORAL | 1 refills | Status: DC | PRN

## 2021-09-27 NOTE — Assessment & Plan Note (Signed)
Recurrent left low back pain with radiation to buttock and leg in the setting of prior fusion surgery for lumbar degenerative disc disease in the past ?No new neurologic changes today ?I suspect there is a component of muscle spasm ?Encouraged her to do slow walking and stretching ?Prednisone 30 mg taper prescribed, side effects discussed as well as methocarbamol 500 mg to take 3 times daily as needed spasm with caution for sedation ?Discussed use of gentle heat as needed ?Update if not starting to improve in a week or if worsening  ?Would consider PT if no further improvement  ?

## 2021-09-27 NOTE — Progress Notes (Signed)
? ?Subjective:  ? ? Patient ID: Nicole Suarez, female    DOB: May 28, 1941, 81 y.o.   MRN: [id number] ? ?This visit occurred during the SARS-CoV-2 public health emergency.  Safety protocols were in place, including screening questions prior to the visit, additional usage of staff PPE, and extensive cleaning of exam room while observing appropriate contact time as indicated for disinfecting solutions.  ? ?HPI ?Here for L sided low back pain rad to L leg ?This is acute flare of chronic problem  ? ?Wt Readings from Last 3 Encounters:  ?09/27/21 147 lb (66.7 kg)  ?05/30/21 144 lb (65.3 kg)  ?05/24/21 144 lb 6 oz (65.5 kg)  ? ?25.83 kg/m? ? ?Was doing well until 3 days ago  ?Started feeling a twinge on and off in L low back  ? ?Sharp in nature  ?Shoots down her buttock  (once a little further into thigh)  ? ?Cannot find a trigger ?Perhaps stepping a certain way  ? ?Best when sitting with feet up  ?Does not hurt to bend forward  ?Hurts to get up then loosens up when she gets moving ?Then it can catch her and she has to stop  ? ? ?Planning a trip on Thursday to [place] ?May have to cancel if her leg worsens  ? ?Having trouble with shoulder  ?Needs a shoulder replacement  ?Doing some PT now ?Thinking about it  ? ? ?Pmhx significant for lumbar DD with past fustion surgery  ? ?Last imaging from a year ago ?CLINICAL DATA: Left low back pain with sciatica. Prior lumbar ?fusion. ? ?EXAM: ?LUMBAR SPINE - COMPLETE 4+ VIEW ? ?COMPARISON: None. ? ?FINDINGS: ?AP, lateral, both oblique and lateral L5-S1 spot views obtained. ?Five non-rib-bearing lumbar type vertebra. Posterior fusion with rod ?and intrapedicular screw L3 through L4 with interbody spacer. ?Hardware is intact without periprosthetic lucency. Diffuse disc ?space narrowing and endplate spurring throughout the visualized ?lower thoracic and lumbar spine. There is prominent facet ?hypertrophy at L4-L5 and L5-S1. Vertebral body heights are normal. ?No evidence of fracture or  focal lesion. Sacroiliac joints are ?congruent with mild degenerative change. Aortic atherosclerosis. ? ?IMPRESSION: ?1. Posterior fusion with rod and intrapedicular screw L3 through L4 ?without complication. ?2. Diffuse degenerative disc disease throughout the lumbar spine. ?3. Facet hypertrophy most prominent at L4-L5 and L5-S1. ? ?Patient Active Problem List  ? Diagnosis Date Noted  ? Current use of proton pump inhibitor 05/24/2021  ? Low back pain 09/13/2020  ? Groin pain, left 09/13/2020  ? Routine general medical examination at a health care facility 04/26/2020  ? Screening mammogram, encounter for 04/26/2020  ? Estrogen deficiency 04/26/2020  ? Elevated glucose level 04/26/2020  ? 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  ? 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  ? GERD (gastroesophageal reflux disease) 07/26/2019  ? Essential hypertension 07/26/2019  ? Hyperlipidemia 07/26/2019  ? Allergic rhinitis 07/26/2019  ? Caregiver stress 07/26/2019  ? Urge incontinence 07/26/2019  ? 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 RLS (restless legs syndrome) 07/26/2019  ? Colon polyp 07/26/2019  ? Osteopenia 07/26/2019  ? History of Mohs micrographic surgery for skin cancer 07/26/2019  ? ?Past Medical History:  ?Diagnosis Date  ? Arthritis   ? Cataract   ? bilateral lens implants  ? GERD (gastroesophageal reflux disease)   ? History of allergy   ? History of basal cell carcinoma ([place]) 2015  ? right temple Mohs  ? History of blood transfusion   ? History of SCC (squamous cell carcinoma) of skin 2017  ?  right upper arm excised  ? 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 History of urinary incontinence   ? History of UTI   ? Hyperlipidemia   ? Hypertension   ? Neuromuscular disorder ([place])   ? restless legs   ? ?Past Surgical History:  ?Procedure Laterality Date  ? APPENDECTOMY  1963  ? CATARACT EXTRACTION, BILATERAL    ? 08/2019, 09/2019  ? [place]  ? MICRODISCECTOMY LUMBAR    ? L3L4  ?  RECTOCELE REPAIR  2009  ? REPLACEMENT TOTAL KNEE  2010  ? ROTATOR CUFF REPAIR  2000  ? urterine suspension  1971  ? ?Social History  ? ?Tobacco Use  ? Smoking status: Never  ? Smokeless tobacco: Never  ?Vaping Use  ? Vaping Use: Never used  ?Substance Use Topics  ? Alcohol use: Never  ? Drug use: Not Currently  ? ?Family History  ?Problem Relation Age of Onset  ? Early death Mother   ? Cancer Father   ? COPD Father   ? Lung cancer Father   ? Pancreatic cancer Sister   ? Alcohol abuse Brother   ? Heart disease Brother   ? Hypertension Brother   ? COPD Maternal Grandmother   ? Colon cancer Neg Hx   ? Esophageal cancer Neg Hx   ? Stomach cancer Neg Hx   ? Rectal cancer Neg Hx   ? Colon polyps Neg Hx   ? ?Allergies  ?Allergen Reactions  ? Ace Inhibitors Other (See Comments)  ?  THROAT SWELLING  ? Requip [Ropinirole]   ?  Vision change   ? Shrimp [Shellfish Allergy] Nausea And Vomiting  ? Aleve [Naproxen] Rash  ? ?Current Outpatient Medications on File Prior to Visit  ?Medication Sig Dispense Refill  ? bromocriptine (PARLODEL) 2.5 MG tablet TAKE 1 TABLET DAILY 90 tablet 3  ? Calcium Carbonate-Vit D-Min (CALCIUM 1200 PO) Take 2 capsules by mouth daily.    ? carvedilol (COREG) 12.5 MG tablet Take 1 tablet (12.5 mg total) by mouth 2 (two) times daily with a meal. 180 tablet 3  ? chlorthalidone (HYGROTON) 25 MG tablet Take 0.5 tablets (12.5 mg total) by mouth daily. 45 tablet 3  ? Docusate Sodium (COLACE PO) Take by mouth.    ? fluticasone (FLONASE) 50 MCG/ACT nasal spray Place 1-2 sprays into both nostrils daily.    ? ibuprofen (ADVIL) 400 MG tablet Take 400 mg by mouth every 8 (eight) hours as needed.    ? levocetirizine (XYZAL) 5 MG tablet Take 5 mg by mouth every evening.    ? Multiple Vitamin (MULTIVITAMIN) capsule Take 1 capsule by mouth daily.    ? Multiple Vitamins-Minerals (PRESERVISION AREDS 2 PO) Take 2 capsules by mouth daily.    ? NON FORMULARY SEVINA: 2 tablets daily    ? pantoprazole (PROTONIX) 40 MG tablet  Take 1 tablet (40 mg total) by mouth daily. 90 tablet 3  ? rosuvastatin (CRESTOR) 10 MG tablet Take 1 tablet (10 mg total) by mouth at bedtime. 90 tablet 3  ? SENNA PO Take by mouth.    ? traMADol (ULTRAM) 50 MG tablet Take 50 mg by mouth every 6 (six) hours as needed.    ? VITAMIN D PO Take 1 capsule by mouth daily.    ? Wheat Dextrin (BENEFIBER PO) Take by mouth daily.    ? ?No current facility-administered medications on file prior to visit.  ?  ?Review of Systems  ?Constitutional:  Negative for activity change, appetite change, fatigue, fever and unexpected weight change.  ?HENT:  Negative for congestion, ear pain, rhinorrhea, sinus pressure and sore throat.   ?Eyes:  Negative for pain, redness and visual disturbance.  ?Respiratory:  Negative for cough, shortness of breath and wheezing.   ?Cardiovascular:  Negative for chest pain and palpitations.  ?Gastrointestinal:  Negative for abdominal pain, blood in stool, constipation and diarrhea.  ?Endocrine: Negative for polydipsia and polyuria.  ?Genitourinary:  Negative for dysuria, frequency and urgency.  ?Musculoskeletal:  Positive for back pain. Negative for arthralgias and myalgias.  ?Skin:  Negative for pallor and rash.  ?Allergic/Immunologic: Negative for environmental allergies.  ?Neurological:  Negative for dizziness, syncope, weakness, numbness and headaches.  ?Hematological:  Negative for adenopathy. Does not bruise/bleed easily.  ?Psychiatric/Behavioral:  Negative for decreased concentration and dysphoric mood. The patient is not nervous/anxious.   ? ?   ?Objective:  ? Physical Exam ?Constitutional:   ?   General: She is not in acute distress. ?   Appearance: Normal appearance. She is normal weight. She is not ill-appearing.  ?Musculoskeletal:  ?   Lumbar back: Spasms and tenderness present. No swelling, deformity or bony tenderness. Decreased range of motion. Negative right straight leg raise test and negative left straight leg raise test.  ?   Comments:  Tenderness in L lumbar musculature ?Baseline scar (midline/lumbar) ?No obv scoliosis  ?Back pain with flexion of 40 degrees and extension of 10 degrees ?Lateral spine movement is normal ?Patient experiences mo

## 2021-09-27 NOTE — Patient Instructions (Signed)
Use some gentle heat on the affected area for 10 minutes at a time  ?Try to stretch and walk as tolerated  ? ?Take prednisone as directed  ?Try the muscle relaxer as needed using caution of sedation  ? ? ?We may consider physical therapy for this in the future  ? ?Update if not starting to improve in a week or if worsening   ? ? ?

## 2021-12-19 ENCOUNTER — Ambulatory Visit (INDEPENDENT_AMBULATORY_CARE_PROVIDER_SITE_OTHER): Payer: Medicare HMO | Admitting: Family Medicine

## 2021-12-19 ENCOUNTER — Encounter: Payer: Self-pay | Admitting: Family Medicine

## 2021-12-19 VITALS — BP 138/80 | HR 62 | Temp 97.3°F | Ht 63.0 in | Wt 154.8 lb

## 2021-12-19 DIAGNOSIS — Z01818 Encounter for other preprocedural examination: Secondary | ICD-10-CM | POA: Insufficient documentation

## 2021-12-19 DIAGNOSIS — E7800 Pure hypercholesterolemia, unspecified: Secondary | ICD-10-CM

## 2021-12-19 DIAGNOSIS — R102 Pelvic and perineal pain: Secondary | ICD-10-CM | POA: Insufficient documentation

## 2021-12-19 DIAGNOSIS — I10 Essential (primary) hypertension: Secondary | ICD-10-CM | POA: Diagnosis not present

## 2021-12-19 DIAGNOSIS — R7309 Other abnormal glucose: Secondary | ICD-10-CM

## 2021-12-19 LAB — CBC WITH DIFFERENTIAL/PLATELET
Basophils Absolute: 0 10*3/uL (ref 0.0–0.1)
Basophils Relative: 0.2 % (ref 0.0–3.0)
Eosinophils Absolute: 0.1 10*3/uL (ref 0.0–0.7)
Eosinophils Relative: 1.4 % (ref 0.0–5.0)
HCT: 40.9 % (ref 36.0–46.0)
Hemoglobin: 13.7 g/dL (ref 12.0–15.0)
Lymphocytes Relative: 27.5 % (ref 12.0–46.0)
Lymphs Abs: 1.6 10*3/uL (ref 0.7–4.0)
MCHC: 33.6 g/dL (ref 30.0–36.0)
MCV: 91.6 fl (ref 78.0–100.0)
Monocytes Absolute: 0.5 10*3/uL (ref 0.1–1.0)
Monocytes Relative: 8.4 % (ref 3.0–12.0)
Neutro Abs: 3.5 10*3/uL (ref 1.4–7.7)
Neutrophils Relative %: 62.5 % (ref 43.0–77.0)
Platelets: 212 10*3/uL (ref 150.0–400.0)
RBC: 4.46 Mil/uL (ref 3.87–5.11)
RDW: 13.6 % (ref 11.5–15.5)
WBC: 5.7 10*3/uL (ref 4.0–10.5)

## 2021-12-19 LAB — COMPREHENSIVE METABOLIC PANEL
ALT: 22 U/L (ref 0–35)
AST: 23 U/L (ref 0–37)
Albumin: 4.2 g/dL (ref 3.5–5.2)
Alkaline Phosphatase: 67 U/L (ref 39–117)
BUN: 23 mg/dL (ref 6–23)
CO2: 32 mEq/L (ref 19–32)
Calcium: 9.7 mg/dL (ref 8.4–10.5)
Chloride: 101 mEq/L (ref 96–112)
Creatinine, Ser: 0.67 mg/dL (ref 0.40–1.20)
GFR: 83.55 mL/min (ref >60.00)
Glucose, Bld: 97 mg/dL (ref 70–99)
Potassium: 4.1 mEq/L (ref 3.5–5.1)
Sodium: 141 mEq/L (ref 135–145)
Total Bilirubin: 0.5 mg/dL (ref 0.2–1.2)
Total Protein: 6.8 g/dL (ref 6.0–8.3)

## 2021-12-19 LAB — HEMOGLOBIN A1C: Hgb A1c MFr Bld: 5.7 % (ref 4.6–6.5)

## 2021-12-19 NOTE — Assessment & Plan Note (Signed)
bp in fair control at this time  BP Readings from Last 1 Encounters:  12/19/21 138/80   No changes needed (tends to have whitecoat change in office) Most recent labs reviewed  Disc lifstyle change with low sodium diet and exercise  Coreg 12.5 mg bid  Chlorthalidone 12.5 mg 1/2 pill daily

## 2021-12-19 NOTE — Progress Notes (Signed)
Subjective:    Patient ID: Nicole Suarez, female    DOB: Sep 06, 1940, 81 y.o.   MRN: [id number]  HPI Pt presents for pre op clearance   Wt Readings from Last 3 Encounters:  12/19/21 154 lb 12.8 oz (70.2 kg)  09/27/21 147 lb (66.7 kg)  05/30/21 144 lb (65.3 kg)   27.42 kg/m  Getting ready for a L reverse shoulder arthroplasty  General anesthesia  Dr Onnie Graham with [facility]  Pretty painful  Takes tramadol prn  Makes it hard to sleep   Daughter will come help for a week after surgery    Drug allergies: Ace causes throat swelling Requip-vision change  Aleve-rash   Has had back surgery and knee replacement in the past    HTN bp is up on first check today  No cp or palpitations or headaches or edema  No side effects to medicines  BP Readings from Last 3 Encounters:  12/19/21 138/80  09/27/21 136/84  05/24/21 134/80    Has white coat HTN At home 127/74  Coreg 12.5 mg bid  Chlorthalidone 12.5 mg 1/2 pill daily   Pulse Readings from Last 3 Encounters:  12/19/21 62  09/27/21 73  05/24/21 66   From [place]: had a w/u in 2019 with cp (from stress)  Had a negative nuclear stress test 10/2017  Echo at the same time noted EF 55% Mild pulm HTN Mild mitral annual calcification   No pulm history  Never smoked No copd or asthma  No history of blood clots   EKG NSR rate of 67 No acute ST or T wave changes Poor R wave progression  I do not see change from prev EKG PR and QT intervals are in nl range   Takes advil -will have go hold for surgery   Glucose control Lab Results  Component Value Date   HGBA1C 5.6 05/18/2021   Hyperlipidemia Lab Results  Component Value Date   CHOL 136 05/18/2021   HDL 61.80 05/18/2021   LDLCALC 62 05/18/2021   TRIG 60.0 05/18/2021   CHOLHDL 2 05/18/2021   Taking crestor 10 mg daily   Lab Results  Component Value Date   CREATININE 0.70 05/18/2021   BUN 20 05/18/2021   NA 141 05/18/2021   K 3.5 05/18/2021   CL 101  05/18/2021   CO2 33 (H) 05/18/2021   Lab Results  Component Value Date   WBC 12.9 (H) 05/18/2021   HGB 13.3 05/18/2021   HCT 40.1 05/18/2021   MCV 90.8 05/18/2021   PLT 180.0 05/18/2021   Sister has ovarian cancer  She has frequent utis -frustrated Low abd/pelvis aches a bit    Patient Active Problem List   Diagnosis Date Noted   Pre-operative clearance 12/19/2021   Pelvic pain in female 12/19/2021   Current use of proton pump inhibitor 05/24/2021   Low back pain 09/13/2020   Groin pain, left 09/13/2020   Routine general medical examination at a health care facility 04/26/2020   Screening mammogram, encounter for 04/26/2020   Estrogen deficiency 04/26/2020   Elevated glucose level 04/26/2020   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   GERD (gastroesophageal reflux disease) 07/26/2019   Essential hypertension 07/26/2019   Hyperlipidemia 07/26/2019   Allergic rhinitis 07/26/2019   Caregiver stress 07/26/2019   Urge incontinence 07/26/2019   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   RLS (restless legs syndrome) 07/26/2019   Colon polyp 07/26/2019   Osteopenia 07/26/2019   History of Mohs micrographic  surgery for skin cancer 07/26/2019   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    History of urinary incontinence    History of UTI    Hyperlipidemia    Hypertension    Neuromuscular disorder (HCC)    restless legs    Past Surgical History:  Procedure Laterality Date   APPENDECTOMY  1963   CATARACT EXTRACTION, BILATERAL     08/2019, 09/2019   CESAREAN SECTION  1964    MICRODISCECTOMY LUMBAR     L3L4   RECTOCELE REPAIR  2009   REPLACEMENT TOTAL KNEE  2010   ROTATOR CUFF REPAIR  2000   urterine suspension  1971   Social History   Tobacco Use   Smoking status: Never   Smokeless tobacco: Never  Vaping Use   Vaping Use: Never used  Substance Use Topics   Alcohol use: Never   Drug use: Not Currently   Family History  Problem Relation Age of Onset   Early death Mother    Cancer Father    COPD Father    Lung cancer Father    Pancreatic cancer Sister    Alcohol abuse Brother    Heart disease Brother    Hypertension Brother    COPD Maternal Grandmother    Colon cancer Neg Hx    Esophageal cancer Neg Hx    Stomach cancer Neg Hx    Rectal cancer Neg Hx    Colon polyps Neg Hx    Allergies  Allergen Reactions   Ace Inhibitors Other (See Comments)    THROAT SWELLING   Requip [Ropinirole]     Vision change    Shrimp [Shellfish Allergy] Nausea And Vomiting   Aleve [Naproxen] Rash   Current Outpatient Medications on File Prior to Visit  Medication Sig Dispense Refill   bromocriptine (PARLODEL) 2.5 MG tablet TAKE 1 TABLET DAILY 90 tablet 3   Calcium Carbonate-Vit D-Min (CALCIUM 1200 PO) Take 2 capsules by mouth daily.     carvedilol (COREG) 12.5 MG tablet Take 1 tablet (12.5 mg total) by mouth 2 (two) times daily with a meal. 180 tablet 3   chlorthalidone (HYGROTON) 25 MG tablet Take 0.5 tablets (12.5 mg total) by mouth daily. 45 tablet 3   Docusate Sodium (COLACE PO) Take by mouth.     fluticasone (FLONASE) 50 MCG/ACT nasal spray Place 1-2 sprays into both nostrils daily.     ibuprofen (ADVIL) 400 MG tablet Take 400 mg by mouth every 8 (eight) hours as needed.     levocetirizine (XYZAL) 5 MG tablet Take 5 mg by mouth every evening.     methocarbamol (ROBAXIN) 500 MG tablet Take 1 tablet (500 mg total) by mouth every 8 (eight) hours as needed for muscle spasms. Use caution of sedation 30 tablet 1   Multiple Vitamin (MULTIVITAMIN) capsule Take 1  capsule by mouth daily.     Multiple Vitamins-Minerals (PRESERVISION AREDS 2 PO) Take 2 capsules by mouth daily.     NON FORMULARY SEVINA: 2 tablets daily     pantoprazole (PROTONIX) 40 MG tablet Take  1 tablet (40 mg total) by mouth daily. 90 tablet 3   rosuvastatin (CRESTOR) 10 MG tablet Take 1 tablet (10 mg total) by mouth at bedtime. 90 tablet 3   SENNA PO Take by mouth.     traMADol (ULTRAM) 50 MG tablet Take 50 mg by mouth every 6 (six) hours as needed.     VITAMIN D PO Take 1 capsule by mouth daily.     Wheat Dextrin (BENEFIBER PO) Take by mouth daily.     No current facility-administered medications on file prior to visit.    Review of Systems  Constitutional:  Negative for activity change, appetite change, fatigue, fever and unexpected weight change.  HENT:  Negative for congestion, ear pain, rhinorrhea, sinus pressure and sore throat.   Eyes:  Negative for pain, redness and visual disturbance.  Respiratory:  Negative for cough, shortness of breath and wheezing.   Cardiovascular:  Negative for chest pain and palpitations.  Gastrointestinal:  Negative for abdominal pain, blood in stool, constipation and diarrhea.  Endocrine: Negative for polydipsia and polyuria.  Genitourinary:  Negative for dysuria, frequency and urgency.  Musculoskeletal:  Positive for arthralgias. Negative for back pain and myalgias.       Left shoulder pain and loss of rom  Skin:  Negative for pallor and rash.  Allergic/Immunologic: Negative for environmental allergies.  Neurological:  Negative for dizziness, syncope and headaches.  Hematological:  Negative for adenopathy. Does not bruise/bleed easily.  Psychiatric/Behavioral:  Negative for decreased concentration and dysphoric mood. The patient is not nervous/anxious.       Objective:   Physical Exam Constitutional:      General: She is not in acute distress.    Appearance: Normal appearance. She is well-developed and normal weight. She is not  ill-appearing or diaphoretic.  HENT:     Head: Normocephalic and atraumatic.     Mouth/Throat:     Mouth: Mucous membranes are moist.     Pharynx: Oropharynx is clear.  Eyes:     General: No scleral icterus.    Conjunctiva/sclera: Conjunctivae normal.     Pupils: Pupils are equal, round, and reactive to light.  Neck:     Thyroid: No thyromegaly.     Vascular: No carotid bruit or JVD.  Cardiovascular:     Rate and Rhythm: Normal rate and regular rhythm.     Heart sounds: Normal heart sounds.    No gallop.  Pulmonary:     Effort: Pulmonary effort is normal. No respiratory distress.     Breath sounds: Normal breath sounds. No stridor. No wheezing, rhonchi or rales.  Chest:     Chest wall: No tenderness.  Abdominal:     General: There is no distension or abdominal bruit.     Palpations: Abdomen is soft.     Tenderness: There is no abdominal tenderness. There is no right CVA tenderness, left CVA tenderness, guarding or rebound.     Comments: No suprapubic tenderness or fullness   Musculoskeletal:     Cervical back: Normal range of motion and neck supple. No tenderness.     Right lower leg: No edema.     Left lower leg: No edema.  Lymphadenopathy:     Cervical: No cervical adenopathy.  Skin:    General: Skin is warm and dry.     Coloration: Skin is not pale.     Findings: No rash.  Neurological:     Mental Status: She is alert.     Coordination: Coordination  normal.     Deep Tendon Reflexes: Reflexes are normal and symmetric. Reflexes normal.  Psychiatric:        Mood and Affect: Mood normal.          Assessment & Plan:   Problem List Items Addressed This Visit       Cardiovascular and Mediastinum   Essential hypertension    bp in fair control at this time  BP Readings from Last 1 Encounters:  12/19/21 138/80  No changes needed (tends to have whitecoat change in office) Most recent labs reviewed  Disc lifstyle change with low sodium diet and exercise  Coreg  12.5 mg bid  Chlorthalidone 12.5 mg 1/2 pill daily       Relevant Orders   EKG 12-Lead (Completed)   CBC with Differential/Platelet   Comprehensive metabolic panel     Other   Elevated glucose level    A1c ordered disc imp of low glycemic diet and wt loss to prevent DM2  She did eat more sugar on vacatoin        Relevant Orders   Hemoglobin A1c   Hyperlipidemia    Disc goals for lipids and reasons to control them Rev last labs with pt Rev low sat fat diet in detail Controlled with crestor 10 mg daily  Last LDL was 62      Pelvic pain in female    occ dull ache Nl exam Sister had ov cancer, pt req [place]  May wait until after her shoulder surgery  She will call when ready for the order       Pre-operative clearance - Primary    BP and cholesterol are controlled No previous cardiac or pulm history  No OSA and no smoking  [place]   EKG today is unchanged  Rev med all/insolerances  Has been nauseated from gen anesth in past   Planning shoulder arthroplasty with Dr Onnie Graham  Has family hep after discharge  inst to hold ibuprofen for 1 week pre surg No blood thinners or asa noted   Cmet,cbc and a1c pending        Relevant Orders   EKG 12-Lead (Completed)

## 2021-12-19 NOTE — Assessment & Plan Note (Signed)
Disc goals for lipids and reasons to control them Rev last labs with pt Rev low sat fat diet in detail Controlled with crestor 10 mg daily  Last LDL was 62

## 2021-12-19 NOTE — Assessment & Plan Note (Signed)
A1c ordered disc imp of low glycemic diet and wt loss to prevent DM2  She did eat more sugar on vacatoin

## 2021-12-19 NOTE — Assessment & Plan Note (Signed)
occ dull ache Nl exam Sister had ov cancer, pt req [place]  May wait until after her shoulder surgery  She will call when ready for the order

## 2021-12-19 NOTE — Patient Instructions (Addendum)
Let me know when you are ready to schedule a pelvic ultrasound for the low abd/pelvic pain   Labs today   You will need to hold ibuprofen for a week before surgery

## 2021-12-19 NOTE — Assessment & Plan Note (Signed)
BP and cholesterol are controlled No previous cardiac or pulm history  No OSA and no smoking  [place]   EKG today is unchanged  Rev med all/insolerances  Has been nauseated from gen anesth in past   Planning shoulder arthroplasty with Dr Onnie Graham  Has family hep after discharge  inst to hold ibuprofen for 1 week pre surg No blood thinners or asa noted   Cmet,cbc and a1c pending

## 2022-01-10 ENCOUNTER — Ambulatory Visit (INDEPENDENT_AMBULATORY_CARE_PROVIDER_SITE_OTHER): Payer: Medicare HMO | Admitting: Family

## 2022-01-10 ENCOUNTER — Encounter: Payer: Self-pay | Admitting: Family Medicine

## 2022-01-10 VITALS — BP 130/76 | HR 74 | Temp 97.5°F | Wt 155.2 lb

## 2022-01-10 DIAGNOSIS — J301 Allergic rhinitis due to pollen: Secondary | ICD-10-CM | POA: Diagnosis not present

## 2022-01-10 DIAGNOSIS — J0110 Acute frontal sinusitis, unspecified: Secondary | ICD-10-CM | POA: Diagnosis not present

## 2022-01-10 MED ORDER — AMOXICILLIN-POT CLAVULANATE 875-125 MG PO TABS: 1.0000 | ORAL_TABLET | Freq: Two times a day (BID) | ORAL | 0 refills | Status: DC

## 2022-01-10 NOTE — Assessment & Plan Note (Signed)
flonase 50 mcg once daily  Continue xyzal

## 2022-01-10 NOTE — Patient Instructions (Signed)
Recommend over the counter mucinex without DM.  Continue flonase as well as xyzal and HBP Coricidin as needed.   Start prescription of augmentin 875/125 mg and take as prescribed.  Tylenol ok for sinus pain as needed Increase oral fluids. Ok to continue with humidifers and hot steamy showers as discussed during visit.  It was a pleasure speaking with you today, I hope you start feeling better soon.  Regards,   Eugenia Pancoast

## 2022-01-10 NOTE — Telephone Encounter (Signed)
I spoke with pt; pt said for one wk had prod cough with green yellow phlegm, when blows nose mucus is greenish yellow. Pt not sure if has fever due to taking ibuprofen 600 mg tid. No CP,SOB or wheezing. Covid test neg on 01/07/22. Pt request abx. Pt scheduled appt with Dr Lorelei Pont 01/10/22 at 2:40. Sending note to Dr Lorelei Pont and Butch Penny CMA.

## 2022-01-10 NOTE — Progress Notes (Signed)
Established Patient Office Visit  Subjective:  Patient ID: Nicole Suarez, female    DOB: 04/24/1941  Age: 81 y.o. MRN: [id number]  CC:  Chief Complaint  Patient presents with   Cough    Productive x 1 week, with green/yellow discharge. Negative covid test 3 days ago    HPI Wynetta Seith is here today with concerns.   For the last one week with productive green sputum with nasal congestion. Feels like really gunky in throat, and hard to cough up. Some chest congestion especially when lying down. Bil ear fullness, no ear pain. Slight sore throat.   Unsure if fever and or chills.  Took a sudafed with only mild relief. Does have high blood pressure just didn't feel good so took it.   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    History of urinary incontinence    History of UTI    Hyperlipidemia    Hypertension    Neuromuscular disorder (HCC)    restless legs     Past Surgical History:  Procedure Laterality Date   APPENDECTOMY  1963   CATARACT EXTRACTION, BILATERAL     08/2019, 09/2019   CESAREAN SECTION  1964   MICRODISCECTOMY LUMBAR     L3L4   RECTOCELE REPAIR  2009   REPLACEMENT TOTAL KNEE  2010   ROTATOR CUFF REPAIR  2000   urterine suspension  1971    Family History  Problem Relation Age of Onset   Early death Mother    Cancer Father    COPD Father    Lung cancer Father    Pancreatic cancer Sister    Alcohol abuse Brother    Heart disease Brother    Hypertension Brother    COPD Maternal Grandmother    Colon cancer Neg Hx    Esophageal cancer Neg Hx    Stomach cancer Neg Hx    Rectal cancer  Neg Hx    Colon polyps Neg Hx     Social History   Socioeconomic History   Marital status: Married    Spouse name: Not on file   Number of children: Not on file   Years of education: Not on file   Highest education level: Not on file  Occupational History   Occupation: retired Therapist, sports  Tobacco Use   Smoking status: Never   Smokeless tobacco: Never  Vaping Use   Vaping Use: Never used  Substance and Sexual Activity   Alcohol use: Never   Drug use: Not Currently   Sexual activity: Not on file  Other Topics Concern   Not on file  Social History Narrative   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    Moved her from [place] in 2020   Cares for disabled husband   Lives in [place] Determinants of Health   Financial Resource Strain: Low Risk  (05/30/2021)   Overall Financial Resource Strain (CARDIA)    Difficulty of Paying Living Expenses: Not hard at all  Food Insecurity: No Food Insecurity (05/30/2021)   Hunger Vital  Sign    Worried About Charity fundraiser in the Last Year: Never true    [place] in the Last Year: Never true  Transportation Needs: No Transportation Needs (05/30/2021)   PRAPARE - Hydrologist (Medical): No    Lack of Transportation (Non-Medical): No  Physical Activity: Insufficiently Active (05/30/2021)   Exercise Vital Sign    Days of Exercise per Week: 7 days    Minutes of Exercise per Session: 20 min  Stress: No Stress Concern Present (05/30/2021)   [place]    Feeling of Stress : Only a little  Social Connections: Moderately Integrated (05/30/2021)   Social Connection and Isolation Panel [NHANES]    Frequency of Communication with Friends and Family: More than three times a week    Frequency of Social Gatherings with Friends and Family: More than three times a week    Attends Religious Services: More than 4 times per year    Active Member of  [employer] or Organizations: No    Attends Archivist Meetings: Never    Marital Status: Married  Human resources officer Violence: Not At Risk (05/30/2021)   Humiliation, Afraid, Rape, and Kick questionnaire    Fear of Current or Ex-Partner: No    Emotionally Abused: No    Physically Abused: No    Sexually Abused: No    Outpatient Medications Prior to Visit  Medication Sig Dispense Refill   bromocriptine (PARLODEL) 2.5 MG tablet TAKE 1 TABLET DAILY 90 tablet 3   Calcium Carbonate-Vit D-Min (CALCIUM 1200 PO) Take 2 capsules by mouth daily.     carvedilol (COREG) 12.5 MG tablet Take 1 tablet (12.5 mg total) by mouth 2 (two) times daily with a meal. 180 tablet 3   chlorthalidone (HYGROTON) 25 MG tablet Take 0.5 tablets (12.5 mg total) by mouth daily. 45 tablet 3   Docusate Sodium (COLACE PO) Take by mouth.     fluticasone (FLONASE) 50 MCG/ACT nasal spray Place 1-2 sprays into both nostrils daily.     ibuprofen (ADVIL) 400 MG tablet Take 400 mg by mouth every 8 (eight) hours as needed.     levocetirizine (XYZAL) 5 MG tablet Take 5 mg by mouth every evening.     methocarbamol (ROBAXIN) 500 MG tablet Take 1 tablet (500 mg total) by mouth every 8 (eight) hours as needed for muscle spasms. Use caution of sedation 30 tablet 1   Multiple Vitamin (MULTIVITAMIN) capsule Take 1 capsule by mouth daily.     Multiple Vitamins-Minerals (PRESERVISION AREDS 2 PO) Take 2 capsules by mouth daily.     NON FORMULARY SEVINA: 2 tablets daily     pantoprazole (PROTONIX) 40 MG tablet Take 1 tablet (40 mg total) by mouth daily. 90 tablet 3   rosuvastatin (CRESTOR) 10 MG tablet Take 1 tablet (10 mg total) by mouth at bedtime. 90 tablet 3   SENNA PO Take by mouth.     traMADol (ULTRAM) 50 MG tablet Take 50 mg by mouth every 6 (six) hours as needed.     VITAMIN D PO Take 1 capsule by mouth daily.     Wheat Dextrin (BENEFIBER PO) Take by mouth daily.     MYRBETRIQ 50 MG TB24 tablet Take 50 mg by mouth daily.     No  facility-administered medications prior to visit.    Allergies  Allergen Reactions   Ace Inhibitors Other (See Comments)  THROAT SWELLING   Requip [Ropinirole]     Vision change    Shrimp [Shellfish Allergy] Nausea And Vomiting   Aleve [Naproxen] Rash        Objective:    Physical Exam Constitutional:      General: She is not in acute distress.    Appearance: Normal appearance. She is obese. She is not ill-appearing, toxic-appearing or diaphoretic.  HENT:     Right Ear: A middle ear effusion (clear) is present.     Left Ear: Tympanic membrane normal.     Nose: Mucosal edema and congestion present. No rhinorrhea.     Right Turbinates: Enlarged. Not swollen.     Left Turbinates: Enlarged. Not swollen.     Right Sinus: Frontal sinus tenderness present.     Left Sinus: Frontal sinus tenderness present.     Mouth/Throat:     Mouth: Mucous membranes are moist.     Pharynx: Posterior oropharyngeal erythema (cobblestoning) present. No pharyngeal swelling or oropharyngeal exudate.     Tonsils: No tonsillar exudate.  Eyes:     Extraocular Movements: Extraocular movements intact.     Conjunctiva/sclera: Conjunctivae normal.     Pupils: Pupils are equal, round, and reactive to light.  Neck:     Thyroid: No thyroid mass.  Cardiovascular:     Rate and Rhythm: Normal rate and regular rhythm.  Pulmonary:     Effort: Pulmonary effort is normal.     Breath sounds: Normal breath sounds.  Lymphadenopathy:     Cervical:     Right cervical: No superficial cervical adenopathy.    Left cervical: No superficial cervical adenopathy.  Neurological:     Mental Status: She is alert.     BP 130/76   Pulse 74   Temp (!) 97.5 F (36.4 C) (Temporal)   Wt 155 lb 4 oz (70.4 kg)   SpO2 98%   BMI 27.50 kg/m  Wt Readings from Last 3 Encounters:  01/10/22 155 lb 4 oz (70.4 kg)  12/19/21 154 lb 12.8 oz (70.2 kg)  09/27/21 147 lb (66.7 kg)     Health Maintenance Due  Topic Date Due    Hepatitis C Screening  Never done    There are no preventive care reminders to display for this patient.  Lab Results  Component Value Date   TSH 1.44 05/18/2021   Lab Results  Component Value Date   WBC 5.7 12/19/2021   HGB 13.7 12/19/2021   HCT 40.9 12/19/2021   MCV 91.6 12/19/2021   PLT 212.0 12/19/2021   Lab Results  Component Value Date   NA 141 12/19/2021   K 4.1 12/19/2021   CO2 32 12/19/2021   GLUCOSE 97 12/19/2021   BUN 23 12/19/2021   CREATININE 0.67 12/19/2021   BILITOT 0.5 12/19/2021   ALKPHOS 67 12/19/2021   AST 23 12/19/2021   ALT 22 12/19/2021   PROT 6.8 12/19/2021   ALBUMIN 4.2 12/19/2021   CALCIUM 9.7 12/19/2021   GFR 83.55 12/19/2021   Lab Results  Component Value Date   HGBA1C 5.7 12/19/2021      Assessment & Plan:   Problem List Items Addressed This Visit       Respiratory   Allergic rhinitis    flonase 50 mcg once daily  Continue xyzal        Acute non-recurrent frontal sinusitis - Primary    Prescription given for augmentin 875/125 mg po bid for ten days. Pt to continue tylenol/ibuprofen prn sinus pain.  Continue with humidifier prn and steam showers recommended as well. instructed If no symptom improvement in 48 hours please f/u  mucinex otc without DM  coricidin prn        Relevant Medications   amoxicillin-clavulanate (AUGMENTIN) 875-125 MG tablet    Meds ordered this encounter  Medications   amoxicillin-clavulanate (AUGMENTIN) 875-125 MG tablet    Sig: Take 1 tablet by mouth 2 (two) times daily.    Dispense:  20 tablet    Refill:  0    Order Specific Question:   Supervising Provider    Answer:   BEDSOLE, AMY E [[id number]    Follow-up: No follow-ups on file.    Eugenia Pancoast, FNP

## 2022-01-10 NOTE — Progress Notes (Addendum)
Anesthesia Review:  PCP: marne Tower - preop clearance on 12/19/21  ON CHART DATED 12/19/21 lov 12/19/21 ON CHART  Cardiologist : none  Chest x-ray : EKG : 12/19/21  ON CHART  Echo : Stress test: Cardiac Cath :  Activity level: can do a flight of stairs without difficulty  Sleep Study/ CPAP : none  Fasting Blood Sugar :      / Checks Blood Sugar -- times a day:   Blood Thinner/ Instructions /Last Dose: ASA / Instructions/ Last Dose :   Hgba1c-12/19/21- 5.7  01/10/22- pt called [facility] with nasal congestion and cough.  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  Has to self cath at times per pt.

## 2022-01-10 NOTE — Assessment & Plan Note (Addendum)
Prescription given for augmentin 875/125 mg po bid for ten days. Pt to continue tylenol/ibuprofen prn sinus pain. Continue with humidifier prn and steam showers recommended as well. instructed If no symptom improvement in 48 hours please f/u  mucinex otc without DM  coricidin prn

## 2022-01-14 ENCOUNTER — Other Ambulatory Visit: Payer: Self-pay | Admitting: Family

## 2022-01-14 ENCOUNTER — Encounter: Payer: Self-pay | Admitting: Family Medicine

## 2022-01-14 DIAGNOSIS — J0110 Acute frontal sinusitis, unspecified: Secondary | ICD-10-CM

## 2022-01-14 MED ORDER — DOXYCYCLINE HYCLATE 100 MG PO TABS: 100.0000 mg | ORAL_TABLET | Freq: Two times a day (BID) | ORAL | 0 refills | Status: AC

## 2022-01-14 NOTE — Telephone Encounter (Signed)
Yes, okay to discontinue Augmentin. I will forward to treating provider.

## 2022-01-14 NOTE — Telephone Encounter (Signed)
Patient called in asking for an update on this. Asking if she can just stop taking it as everytime she eats she gets diarrhea.

## 2022-01-21 NOTE — Progress Notes (Signed)
DUE TO COVID-19 ONLY ONE VISITOR IS ALLOWED TO COME WITH YOU AND STAY IN THE WAITING ROOM ONLY DURING PRE OP AND PROCEDURE DAY OF SURGERY.  2 VISITOR  MAY VISIT WITH YOU AFTER SURGERY IN YOUR PRIVATE ROOM DURING VISITING HOURS ONLY! YOU MAY HAVE ONE PERSON SPEND THE NITE WITH YOU IN YOUR ROOM AFTER SURGERY.     Your procedure is scheduled on:           01/31/2022   Report to [facility] Main  Entrance   Report to admitting at   0515               AM DO NOT [place], PICTURE ID OR WALLET DAY OF SURGERY.      Call this number if you have problems the morning of surgery [phone]    REMEMBER: NO  SOLID FOODS , CANDY, GUM OR MINTS AFTER [place] .       Marland Kitchen CLEAR LIQUIDS UNTIL    0430am             DAY OF SURGERY.      PLEASE FINISH ENSURE DRINK PER SURGEON ORDER  WHICH NEEDS TO BE COMPLETED AT     0430am       MORNING OF SURGERY.       CLEAR LIQUID DIET   Foods Allowed      WATER BLACK COFFEE ( SUGAR OK, NO MILK, CREAM OR CREAMER) REGULAR AND DECAF  TEA ( SUGAR OK NO MILK, CREAM, OR CREAMER) REGULAR AND DECAF  PLAIN JELLO ( NO RED)  FRUIT ICES ( NO RED, NO FRUIT PULP)  POPSICLES ( NO RED)  JUICE- APPLE, WHITE GRAPE AND WHITE CRANBERRY  SPORT DRINK LIKE GATORADE ( NO RED)  CLEAR BROTH ( VEGETABLE , CHICKEN OR BEEF)                                                                     BRUSH YOUR TEETH MORNING OF SURGERY AND RINSE YOUR MOUTH OUT, NO CHEWING GUM CANDY OR MINTS.     Take these medicines the morning of surgery with A SIP OF WATER:  coreg, flonase, protonix, myrbetriq,   DO NOT TAKE ANY DIABETIC MEDICATIONS DAY OF YOUR SURGERY                               You may not have any metal on your body including hair pins and              piercings  Do not wear jewelry, make-up, lotions, powders or perfumes, deodorant             Do not wear nail polish on your fingernails.              IF YOU ARE A FEMALE AND WANT TO SHAVE UNDER  ARMS OR LEGS PRIOR TO SURGERY YOU MUST DO SO AT LEAST 48 HOURS PRIOR TO SURGERY.              Men may shave face and neck.   Do not bring valuables to the hospital. [place] IS NOT  RESPONSIBLE   FOR VALUABLES.  Contacts, dentures or bridgework may not be worn into surgery.  Leave suitcase in the car. After surgery it may be brought to your room.     Patients discharged the day of surgery will not be allowed to drive home. IF YOU ARE HAVING SURGERY AND GOING HOME THE SAME DAY, YOU MUST HAVE AN ADULT TO DRIVE YOU HOME AND BE WITH YOU FOR 24 HOURS. YOU MAY GO HOME BY TAXI OR UBER OR ORTHERWISE, BUT AN ADULT MUST ACCOMPANY YOU HOME AND STAY WITH YOU FOR 24 HOURS.                Please read over the following fact sheets you were given: _____________________________________________________________________  [facility] - Preparing for Surgery Before surgery, you can play an important role.  Because skin is not sterile, your skin needs to be as free of germs as possible.  You can reduce the number of germs on your skin by washing with CHG (chlorahexidine gluconate) soap before surgery.  CHG is an antiseptic cleaner which kills germs and bonds with the skin to continue killing germs even after washing. Please DO NOT use if you have an allergy to CHG or antibacterial soaps.  If your skin becomes reddened/irritated stop using the CHG and inform your nurse when you arrive at Short Stay. Do not shave (including legs and underarms) for at least 48 hours prior to the first CHG shower.  You may shave your face/neck. Please follow these instructions carefully:  1.  Shower with CHG Soap the night before surgery and the  morning of Surgery.  2.  If you choose to wash your hair, wash your hair first as usual with your  normal  shampoo.  3.  After you shampoo, rinse your hair and body thoroughly to remove the  shampoo.                           4.  Use CHG as you would any other liquid soap.  You  can apply chg directly  to the skin and wash                       Gently with a scrungie or clean washcloth.  5.  Apply the CHG Soap to your body ONLY FROM THE NECK DOWN.   Do not use on face/ open                           Wound or open sores. Avoid contact with eyes, ears mouth and genitals (private parts).                       Wash face,  Genitals (private parts) with your normal soap.             6.  Wash thoroughly, paying special attention to the area where your surgery  will be performed.  7.  Thoroughly rinse your body with warm water from the neck down.  8.  DO NOT shower/wash with your normal soap after using and rinsing off  the CHG Soap.                9.  Pat yourself dry with a clean towel.            10.  Wear clean pajamas.  11.  Place clean sheets on your bed the night of your first shower and do not  sleep with pets. Day of Surgery : Do not apply any lotions/deodorants the morning of surgery.  Please wear clean clothes to the hospital/surgery center.  FAILURE TO FOLLOW THESE INSTRUCTIONS MAY RESULT IN THE CANCELLATION OF YOUR SURGERY PATIENT SIGNATURE_________________________________  NURSE SIGNATURE__________________________________  ________________________________________________________________________

## 2022-01-21 NOTE — Progress Notes (Signed)
[facility]- Preparing for Total Shoulder Arthroplasty  °  °Before surgery, you can play an important role. Because skin is not sterile, your skin needs to be as free of germs as possible. You can reduce the number of germs on your skin by using the following products. °Benzoyl Peroxide Gel °Reduces the number of germs present on the skin °Applied twice a day to shoulder area starting two days before surgery   ° °================================================================== ° °Please follow these instructions carefully: ° °BENZOYL PEROXIDE 5% GEL ° °Please do not use if you have an allergy to benzoyl peroxide.   If your skin becomes reddened/irritated stop using the benzoyl peroxide. ° °Starting two days before surgery, apply as follows: °Apply benzoyl peroxide in the morning and at night. Apply after taking a shower. If you are not taking a shower clean entire shoulder front, back, and side along with the armpit with a clean wet washcloth. ° °Place a quarter-sized dollop on your shoulder and rub in thoroughly, making sure to cover the front, back, and side of your shoulder, along with the armpit.  ° °2 days before ____ AM   ____ PM              1 day before ____ AM   ____ PM °                        °Do this twice a day for two days.  (Last application is the night before surgery, AFTER using the CHG soap as described below). ° °Do NOT apply benzoyl peroxide gel on the day of surgery.  °

## 2022-01-22 ENCOUNTER — Encounter (HOSPITAL_COMMUNITY)
Admission: RE | Admit: 2022-01-22 | Discharge: 2022-01-22 | Disposition: A | Discharge status: Home / Self Care | Payer: Medicare HMO | Source: Ambulatory Visit | Attending: Orthopedic Surgery | Admitting: Orthopedic Surgery

## 2022-01-22 ENCOUNTER — Encounter (HOSPITAL_COMMUNITY): Payer: Self-pay

## 2022-01-22 ENCOUNTER — Other Ambulatory Visit: Payer: Self-pay

## 2022-01-22 ENCOUNTER — Encounter: Payer: Self-pay | Admitting: Family Medicine

## 2022-01-22 VITALS — BP 144/85 | HR 70 | Temp 98.5°F | Resp 16 | Ht 64.5 in | Wt 149.0 lb

## 2022-01-22 DIAGNOSIS — Z01818 Encounter for other preprocedural examination: Secondary | ICD-10-CM

## 2022-01-22 DIAGNOSIS — Z01812 Encounter for preprocedural laboratory examination: Secondary | ICD-10-CM | POA: Diagnosis not present

## 2022-01-22 DIAGNOSIS — E119 Type 2 diabetes mellitus without complications: Secondary | ICD-10-CM | POA: Insufficient documentation

## 2022-01-22 HISTORY — DX: Other specified postprocedural states: Z98.890

## 2022-01-22 HISTORY — DX: Nausea with vomiting, unspecified: R11.2

## 2022-01-22 HISTORY — DX: Other specified postprocedural states: R11.2

## 2022-01-22 LAB — BASIC METABOLIC PANEL
Anion gap: 8 (ref 5–15)
BUN: 25 mg/dL — ABNORMAL HIGH (ref 8–23)
CO2: 29 mmol/L (ref 22–32)
Calcium: 9.9 mg/dL (ref 8.9–10.3)
Chloride: 102 mmol/L (ref 98–111)
Creatinine, Ser: 0.77 mg/dL (ref 0.44–1.00)
GFR, Estimated: >60 mL/min (ref >60)
Glucose, Bld: 96 mg/dL (ref 70–99)
Potassium: 4.5 mmol/L (ref 3.5–5.1)
Sodium: 139 mmol/L (ref 135–145)

## 2022-01-22 LAB — SURGICAL PCR SCREEN
MRSA, PCR: NEGATIVE
Staphylococcus aureus: NEGATIVE

## 2022-01-22 LAB — CBC
HCT: 45.6 % (ref 36.0–46.0)
Hemoglobin: 15.1 g/dL — ABNORMAL HIGH (ref 12.0–15.0)
MCH: 30.5 pg (ref 26.0–34.0)
MCHC: 33.1 g/dL (ref 30.0–36.0)
MCV: 92.1 fL (ref 80.0–100.0)
Platelets: 257 10*3/uL (ref 150–400)
RBC: 4.95 MIL/uL (ref 3.87–5.11)
RDW: 12.7 % (ref 11.5–15.5)
WBC: 6.6 10*3/uL (ref 4.0–10.5)
nRBC: 0 % (ref 0.0–0.2)

## 2022-01-22 MED ORDER — FLUCONAZOLE 150 MG PO TABS: 150.0000 mg | ORAL_TABLET | Freq: Once | ORAL | 0 refills | Status: AC

## 2022-01-31 ENCOUNTER — Other Ambulatory Visit: Payer: Self-pay

## 2022-01-31 ENCOUNTER — Encounter (HOSPITAL_COMMUNITY)
Admission: RE | Disposition: A | Discharge status: Home / Self Care | Payer: Self-pay | Source: Ambulatory Visit | Attending: Orthopedic Surgery

## 2022-01-31 ENCOUNTER — Ambulatory Visit (HOSPITAL_COMMUNITY)
Admission: RE | Admit: 2022-01-31 | Discharge: 2022-01-31 | Disposition: A | Discharge status: Home / Self Care | Payer: Medicare HMO | Source: Ambulatory Visit | Attending: Orthopedic Surgery | Admitting: Orthopedic Surgery

## 2022-01-31 ENCOUNTER — Ambulatory Visit (HOSPITAL_BASED_OUTPATIENT_CLINIC_OR_DEPARTMENT_OTHER): Payer: Medicare HMO | Admitting: Anesthesiology

## 2022-01-31 ENCOUNTER — Ambulatory Visit (HOSPITAL_COMMUNITY): Payer: Medicare HMO | Admitting: Physician Assistant

## 2022-01-31 ENCOUNTER — Encounter (HOSPITAL_COMMUNITY): Payer: Self-pay | Admitting: Orthopedic Surgery

## 2022-01-31 DIAGNOSIS — M19012 Primary osteoarthritis, left shoulder: Secondary | ICD-10-CM | POA: Insufficient documentation

## 2022-01-31 DIAGNOSIS — M12812 Other specific arthropathies, not elsewhere classified, left shoulder: Secondary | ICD-10-CM | POA: Diagnosis not present

## 2022-01-31 DIAGNOSIS — M75102 Unspecified rotator cuff tear or rupture of left shoulder, not specified as traumatic: Secondary | ICD-10-CM | POA: Diagnosis present

## 2022-01-31 DIAGNOSIS — K219 Gastro-esophageal reflux disease without esophagitis: Secondary | ICD-10-CM | POA: Insufficient documentation

## 2022-01-31 DIAGNOSIS — I10 Essential (primary) hypertension: Secondary | ICD-10-CM | POA: Insufficient documentation

## 2022-01-31 DIAGNOSIS — Z01818 Encounter for other preprocedural examination: Secondary | ICD-10-CM

## 2022-01-31 HISTORY — PX: REVERSE SHOULDER ARTHROPLASTY: SHX5054

## 2022-01-31 SURGERY — ARTHROPLASTY, SHOULDER, TOTAL, REVERSE
Anesthesia: General | Site: Shoulder | Laterality: Left

## 2022-01-31 MED ORDER — ROCURONIUM BROMIDE 10 MG/ML (PF) SYRINGE
PREFILLED_SYRINGE | INTRAVENOUS | Status: AC
  Filled 2022-01-31: qty 10

## 2022-01-31 MED ORDER — ACETAMINOPHEN 10 MG/ML IV SOLN: 1000.0000 mg | Freq: Once | INTRAVENOUS | Status: DC | PRN

## 2022-01-31 MED ORDER — LIDOCAINE HCL (PF) 2 % IJ SOLN
INTRAMUSCULAR | Status: AC
  Filled 2022-01-31: qty 5

## 2022-01-31 MED ORDER — SUGAMMADEX SODIUM 200 MG/2ML IV SOLN
INTRAVENOUS | Status: DC | PRN
  Administered 2022-01-31: 300 mg via INTRAVENOUS

## 2022-01-31 MED ORDER — TRANEXAMIC ACID-NACL 1000-0.7 MG/100ML-% IV SOLN
1000.0000 mg | INTRAVENOUS | Status: AC
  Administered 2022-01-31: 1000 mg via INTRAVENOUS

## 2022-01-31 MED ORDER — ONDANSETRON HCL 4 MG PO TABS: 4.0000 mg | ORAL_TABLET | Freq: Three times a day (TID) | ORAL | 0 refills | Status: DC | PRN

## 2022-01-31 MED ORDER — ONDANSETRON HCL 4 MG/2ML IJ SOLN
INTRAMUSCULAR | Status: DC | PRN
  Administered 2022-01-31: 4 mg via INTRAVENOUS

## 2022-01-31 MED ORDER — AMISULPRIDE (ANTIEMETIC) 5 MG/2ML IV SOLN: 10.0000 mg | Freq: Once | INTRAVENOUS | Status: DC | PRN

## 2022-01-31 MED ORDER — DIPHENHYDRAMINE HCL 12.5 MG/5ML PO ELIX: 12.5000 mg | ORAL_SOLUTION | ORAL | Status: DC | PRN

## 2022-01-31 MED ORDER — METOCLOPRAMIDE HCL 5 MG PO TABS: 5.0000 mg | ORAL_TABLET | Freq: Three times a day (TID) | ORAL | Status: DC | PRN

## 2022-01-31 MED ORDER — MIDAZOLAM HCL 2 MG/2ML IJ SOLN
INTRAMUSCULAR | Status: AC
  Filled 2022-01-31: qty 2

## 2022-01-31 MED ORDER — FENTANYL CITRATE (PF) 100 MCG/2ML IJ SOLN
INTRAMUSCULAR | Status: DC | PRN
  Administered 2022-01-31 (×3): 50 ug via INTRAVENOUS

## 2022-01-31 MED ORDER — PHENOL 1.4 % MT LIQD: 1.0000 | OROMUCOSAL | Status: DC | PRN

## 2022-01-31 MED ORDER — FENTANYL CITRATE (PF) 250 MCG/5ML IJ SOLN
INTRAMUSCULAR | Status: AC
  Filled 2022-01-31: qty 5

## 2022-01-31 MED ORDER — IBUPROFEN 800 MG PO TABS: 800.0000 mg | ORAL_TABLET | Freq: Three times a day (TID) | ORAL | 0 refills | Status: DC | PRN

## 2022-01-31 MED ORDER — CEFAZOLIN SODIUM-DEXTROSE 2-4 GM/100ML-% IV SOLN
INTRAVENOUS | Status: AC
  Filled 2022-01-31: qty 100

## 2022-01-31 MED ORDER — METOCLOPRAMIDE HCL 5 MG/ML IJ SOLN: 5.0000 mg | Freq: Three times a day (TID) | INTRAMUSCULAR | Status: DC | PRN

## 2022-01-31 MED ORDER — PROPOFOL 10 MG/ML IV BOLUS
INTRAVENOUS | Status: AC
  Filled 2022-01-31: qty 20

## 2022-01-31 MED ORDER — PHENYLEPHRINE HCL-NACL 20-0.9 MG/250ML-% IV SOLN
INTRAVENOUS | Status: AC
  Filled 2022-01-31: qty 250

## 2022-01-31 MED ORDER — DOCUSATE SODIUM 100 MG PO CAPS: 100.0000 mg | ORAL_CAPSULE | Freq: Two times a day (BID) | ORAL | Status: DC

## 2022-01-31 MED ORDER — ROCURONIUM BROMIDE 10 MG/ML (PF) SYRINGE
PREFILLED_SYRINGE | INTRAVENOUS | Status: DC | PRN
  Administered 2022-01-31: 10 mg via INTRAVENOUS
  Administered 2022-01-31: 60 mg via INTRAVENOUS

## 2022-01-31 MED ORDER — PANTOPRAZOLE SODIUM 40 MG PO TBEC: 40.0000 mg | DELAYED_RELEASE_TABLET | Freq: Every day | ORAL | Status: DC

## 2022-01-31 MED ORDER — DEXAMETHASONE SODIUM PHOSPHATE 4 MG/ML IJ SOLN
INTRAMUSCULAR | Status: DC | PRN
  Administered 2022-01-31: 8 mg via INTRAVENOUS

## 2022-01-31 MED ORDER — FENTANYL CITRATE PF 50 MCG/ML IJ SOSY: 25.0000 ug | PREFILLED_SYRINGE | INTRAMUSCULAR | Status: DC | PRN

## 2022-01-31 MED ORDER — OXYCODONE-ACETAMINOPHEN 5-325 MG PO TABS: 1.0000 | ORAL_TABLET | ORAL | 0 refills | Status: DC | PRN

## 2022-01-31 MED ORDER — METHOCARBAMOL 500 MG PO TABS: 500.0000 mg | ORAL_TABLET | Freq: Three times a day (TID) | ORAL | 1 refills | Status: DC | PRN

## 2022-01-31 MED ORDER — CEFAZOLIN SODIUM-DEXTROSE 2-4 GM/100ML-% IV SOLN
2.0000 g | INTRAVENOUS | Status: AC
  Administered 2022-01-31: 2 g via INTRAVENOUS

## 2022-01-31 MED ORDER — ALUM & MAG HYDROXIDE-SIMETH 200-200-20 MG/5ML PO SUSP: 30.0000 mL | ORAL | Status: DC | PRN

## 2022-01-31 MED ORDER — LACTATED RINGERS IV BOLUS
500.0000 mL | Freq: Once | INTRAVENOUS | Status: AC
  Administered 2022-01-31: 500 mL via INTRAVENOUS

## 2022-01-31 MED ORDER — 0.9 % SODIUM CHLORIDE (POUR BTL) OPTIME
TOPICAL | Status: DC | PRN
  Administered 2022-01-31: 1000 mL

## 2022-01-31 MED ORDER — ORAL CARE MOUTH RINSE: 15.0000 mL | Freq: Once | OROMUCOSAL | Status: AC

## 2022-01-31 MED ORDER — LACTATED RINGERS IV SOLN: INTRAVENOUS | Status: DC

## 2022-01-31 MED ORDER — PROMETHAZINE HCL 25 MG/ML IJ SOLN: 6.2500 mg | INTRAMUSCULAR | Status: DC | PRN

## 2022-01-31 MED ORDER — PROPOFOL 10 MG/ML IV BOLUS
INTRAVENOUS | Status: DC | PRN
  Administered 2022-01-31: 110 mg via INTRAVENOUS

## 2022-01-31 MED ORDER — MIDAZOLAM HCL 5 MG/5ML IJ SOLN
INTRAMUSCULAR | Status: DC | PRN
  Administered 2022-01-31 (×2): 1 mg via INTRAVENOUS

## 2022-01-31 MED ORDER — STERILE WATER FOR IRRIGATION IR SOLN
Status: DC | PRN
  Administered 2022-01-31: 2000 mL

## 2022-01-31 MED ORDER — TRAMADOL HCL 50 MG PO TABS: 50.0000 mg | ORAL_TABLET | Freq: Every evening | ORAL | 0 refills | Status: DC | PRN

## 2022-01-31 MED ORDER — BUPIVACAINE HCL (PF) 0.5 % IJ SOLN
INTRAMUSCULAR | Status: DC | PRN
  Administered 2022-01-31: 12 mL via PERINEURAL

## 2022-01-31 MED ORDER — BUPIVACAINE LIPOSOME 1.3 % IJ SUSP
INTRAMUSCULAR | Status: DC | PRN
  Administered 2022-01-31: 10 mL via PERINEURAL

## 2022-01-31 MED ORDER — DEXAMETHASONE SODIUM PHOSPHATE 10 MG/ML IJ SOLN
INTRAMUSCULAR | Status: AC
  Filled 2022-01-31: qty 1

## 2022-01-31 MED ORDER — MENTHOL 3 MG MT LOZG: 1.0000 | LOZENGE | OROMUCOSAL | Status: DC | PRN

## 2022-01-31 MED ORDER — BISACODYL 5 MG PO TBEC: 5.0000 mg | DELAYED_RELEASE_TABLET | Freq: Every day | ORAL | Status: DC | PRN

## 2022-01-31 MED ORDER — ONDANSETRON HCL 4 MG/2ML IJ SOLN: 4.0000 mg | Freq: Four times a day (QID) | INTRAMUSCULAR | Status: DC | PRN

## 2022-01-31 MED ORDER — ONDANSETRON HCL 4 MG/2ML IJ SOLN
INTRAMUSCULAR | Status: AC
  Filled 2022-01-31: qty 2

## 2022-01-31 MED ORDER — TRANEXAMIC ACID-NACL 1000-0.7 MG/100ML-% IV SOLN
INTRAVENOUS | Status: AC
  Filled 2022-01-31: qty 100

## 2022-01-31 MED ORDER — OXYCODONE HCL 5 MG/5ML PO SOLN: 5.0000 mg | Freq: Once | ORAL | Status: DC | PRN

## 2022-01-31 MED ORDER — CHLORHEXIDINE GLUCONATE 0.12 % MT SOLN
15.0000 mL | Freq: Once | OROMUCOSAL | Status: AC
  Administered 2022-01-31: 15 mL via OROMUCOSAL

## 2022-01-31 MED ORDER — ONDANSETRON HCL 4 MG PO TABS: 4.0000 mg | ORAL_TABLET | Freq: Four times a day (QID) | ORAL | Status: DC | PRN

## 2022-01-31 MED ORDER — LACTATED RINGERS IV BOLUS
250.0000 mL | Freq: Once | INTRAVENOUS | Status: AC
  Administered 2022-01-31: 250 mL via INTRAVENOUS

## 2022-01-31 MED ORDER — PHENYLEPHRINE HCL-NACL 20-0.9 MG/250ML-% IV SOLN
INTRAVENOUS | Status: DC | PRN
  Administered 2022-01-31: 50 ug/min via INTRAVENOUS

## 2022-01-31 MED ORDER — VANCOMYCIN HCL 1000 MG IV SOLR
INTRAVENOUS | Status: DC | PRN
  Administered 2022-01-31: 1000 mg via TOPICAL

## 2022-01-31 MED ORDER — LIDOCAINE 2% (20 MG/ML) 5 ML SYRINGE
INTRAMUSCULAR | Status: DC | PRN
  Administered 2022-01-31: 40 mg via INTRAVENOUS

## 2022-01-31 MED ORDER — POLYETHYLENE GLYCOL 3350 17 G PO PACK: 17.0000 g | PACK | Freq: Every day | ORAL | Status: DC | PRN

## 2022-01-31 MED ORDER — ACETAMINOPHEN 325 MG PO TABS: 325.0000 mg | ORAL_TABLET | ORAL | Status: DC | PRN

## 2022-01-31 MED ORDER — OXYCODONE HCL 5 MG PO TABS: 5.0000 mg | ORAL_TABLET | Freq: Once | ORAL | Status: DC | PRN

## 2022-01-31 MED ORDER — ACETAMINOPHEN 160 MG/5ML PO SOLN: 325.0000 mg | ORAL | Status: DC | PRN

## 2022-01-31 MED ORDER — VANCOMYCIN HCL 1000 MG IV SOLR
INTRAVENOUS | Status: AC
  Filled 2022-01-31: qty 20

## 2022-01-31 SURGICAL SUPPLY — 71 items
BAG COUNTER SPONGE SURGICOUNT (BAG) ×1 IMPLANT
BAG ZIPLOCK 12X15 (MISCELLANEOUS) ×2 IMPLANT
BLADE SAW SGTL 83.5X18.5 (BLADE) ×2 IMPLANT
BNDG COHESIVE 4X5 TAN STRL LF (GAUZE/BANDAGES/DRESSINGS) ×2 IMPLANT
COOLER ICEMAN CLASSIC (MISCELLANEOUS) ×2 IMPLANT
COVER BACK TABLE 60X90IN (DRAPES) ×2 IMPLANT
COVER SURGICAL LIGHT HANDLE (MISCELLANEOUS) ×2 IMPLANT
CUP SUT UNIV REVERS 36 NEUTRAL (Cup) ×1 IMPLANT
DERMABOND ADVANCED (GAUZE/BANDAGES/DRESSINGS) ×1
DERMABOND ADVANCED .7 DNX12 (GAUZE/BANDAGES/DRESSINGS) ×1 IMPLANT
DRAPE INCISE IOBAN 66X45 STRL (DRAPES) IMPLANT
DRAPE ORTHO SPLIT 77X108 STRL (DRAPES) ×2
DRAPE SHEET LG 3/4 BI-LAMINATE (DRAPES) ×2 IMPLANT
DRAPE SURG 17X11 SM STRL (DRAPES) ×2 IMPLANT
DRAPE SURG ORHT 6 SPLT 77X108 (DRAPES) ×2 IMPLANT
DRAPE TOP 10253 STERILE (DRAPES) ×2 IMPLANT
DRAPE U-SHAPE 47X51 STRL (DRAPES) ×2 IMPLANT
DRESSING AQUACEL AG SP 3.5X6 (GAUZE/BANDAGES/DRESSINGS) ×1 IMPLANT
DRSG AQUACEL AG ADV 3.5X10 (GAUZE/BANDAGES/DRESSINGS) IMPLANT
DRSG AQUACEL AG SP 3.5X6 (GAUZE/BANDAGES/DRESSINGS) ×2
DRSG TEGADERM 8X12 (GAUZE/BANDAGES/DRESSINGS) ×2 IMPLANT
DURAPREP 26ML APPLICATOR (WOUND CARE) ×2 IMPLANT
ELECT BLADE TIP CTD 4 INCH (ELECTRODE) ×2 IMPLANT
ELECT PENCIL ROCKER SW 15FT (MISCELLANEOUS) ×2 IMPLANT
ELECT REM PT RETURN 15FT ADLT (MISCELLANEOUS) ×2 IMPLANT
FACESHIELD WRAPAROUND (MASK) ×8 IMPLANT
FACESHIELD WRAPAROUND OR TEAM (MASK) ×4 IMPLANT
GLENOID UNI REV MOD 24 +2 LAT (Joint) ×1 IMPLANT
GLENOSPHERE 36 +4 LAT/24 (Joint) ×1 IMPLANT
GLOVE BIO SURGEON STRL SZ7.5 (GLOVE) ×2 IMPLANT
GLOVE BIO SURGEON STRL SZ8 (GLOVE) ×2 IMPLANT
GLOVE SS BIOGEL STRL SZ 7 (GLOVE) ×1 IMPLANT
GLOVE SS BIOGEL STRL SZ 7.5 (GLOVE) ×1 IMPLANT
GLOVE SUPERSENSE BIOGEL SZ 7 (GLOVE) ×1
GLOVE SUPERSENSE BIOGEL SZ 7.5 (GLOVE) ×1
GOWN STRL REIN XL XLG (GOWN DISPOSABLE) ×4 IMPLANT
KIT BASIN OR (CUSTOM PROCEDURE TRAY) ×2 IMPLANT
KIT TURNOVER KIT A (KITS) ×1 IMPLANT
LINER HUMERAL 36 +3MM SM (Shoulder) ×1 IMPLANT
MANIFOLD NEPTUNE II (INSTRUMENTS) ×2 IMPLANT
NDL TAPERED W/ NITINOL LOOP (MISCELLANEOUS) ×1 IMPLANT
NEEDLE TAPERED W/ NITINOL LOOP (MISCELLANEOUS) ×2 IMPLANT
NS IRRIG 1000ML POUR BTL (IV SOLUTION) ×2 IMPLANT
PACK SHOULDER (CUSTOM PROCEDURE TRAY) ×2 IMPLANT
PAD ARMBOARD 7.5X6 YLW CONV (MISCELLANEOUS) ×2 IMPLANT
PAD COLD SHLDR WRAP-ON (PAD) ×2 IMPLANT
PIN NITINOL TARGETER 2.8 (PIN) IMPLANT
PIN SET MODULAR GLENOID SYSTEM (PIN) ×1 IMPLANT
RESTRAINT HEAD UNIVERSAL NS (MISCELLANEOUS) ×2 IMPLANT
SCREW CENTRAL MODULAR 25 (Screw) ×1 IMPLANT
SCREW PERI LOCK 5.5X16 (Screw) ×2 IMPLANT
SCREW PERI LOCK 5.5X36 (Screw) ×1 IMPLANT
SCREW PERIPHERAL 5.5X28 LOCK (Screw) ×1 IMPLANT
SLING ARM FOAM STRAP LRG (SOFTGOODS) IMPLANT
SLING ARM FOAM STRAP MED (SOFTGOODS) ×1 IMPLANT
SPONGE T-LAP 4X18 ~~LOC~~+RFID (SPONGE) ×2 IMPLANT
STEM HUM UNIV REV SZ11 (Stem) ×1 IMPLANT
SUCTION FRAZIER HANDLE 12FR (TUBING) ×1
SUCTION TUBE FRAZIER 12FR DISP (TUBING) ×1 IMPLANT
SUT FIBERWIRE #2 38 T-5 BLUE (SUTURE)
SUT MNCRL AB 3-0 PS2 18 (SUTURE) ×2 IMPLANT
SUT MON AB 2-0 CT1 36 (SUTURE) ×2 IMPLANT
SUT VIC AB 1 CT1 36 (SUTURE) ×2 IMPLANT
SUTURE FIBERWR #2 38 T-5 BLUE (SUTURE) IMPLANT
SUTURE TAPE 1.3 40 TPR END (SUTURE) ×2 IMPLANT
SUTURETAPE 1.3 40 TPR END (SUTURE) ×4
TOWEL OR 17X26 10 PK STRL BLUE (TOWEL DISPOSABLE) ×2 IMPLANT
TOWEL OR NON WOVEN STRL DISP B (DISPOSABLE) ×2 IMPLANT
TUBE SUCTION HIGH CAP CLEAR NV (SUCTIONS) ×2 IMPLANT
WATER STERILE IRR 1000ML POUR (IV SOLUTION) ×4 IMPLANT
YANKAUER SUCT BULB TIP 10FT TU (MISCELLANEOUS) IMPLANT

## 2022-01-31 NOTE — Anesthesia Procedure Notes (Signed)
Procedure Name: Intubation Date/Time: 01/31/2022 7:43 AM  Performed by: Deliah Boston, CRNAPre-anesthesia Checklist: Patient identified, Emergency Drugs available, Suction available and Patient being monitored Patient Re-evaluated:Patient Re-evaluated prior to induction Oxygen Delivery Method: Circle system utilized Preoxygenation: Pre-oxygenation with 100% oxygen Induction Type: IV induction Ventilation: Mask ventilation without difficulty Laryngoscope Size: Mac and 3 Grade View: Grade I Tube type: Oral Tube size: 7.0 mm Number of attempts: 1 Airway Equipment and Method: Stylet Placement Confirmation: ETT inserted through vocal cords under direct vision, positive ETCO2 and breath sounds checked- equal and bilateral Secured at: 22 cm Tube secured with: Tape Dental Injury: Teeth and Oropharynx as per pre-operative assessment

## 2022-01-31 NOTE — Evaluation (Signed)
Occupational Therapy Evaluation Patient Details Name: Nicole Suarez MRN: [id number] DOB: Dec 03, 1940 Today's Date: 01/31/2022   History of Present Illness 81 year old female s/p Left shoulder reverse arthroplasty under Dr. Onnie Graham on 01/31/22. PMH: HTN, RLS   Clinical Impression   Pt is a 81 year old female, s/p LT reverse shoulder replacement without functional use of LT non-dominant upper extremity secondary to effects of surgery and interscalene block and shoulder precautions. Therapist provided education and instruction to patient and daughter in regards to exercises, precautions, positioning, donning upper extremity clothing and bathing while maintaining shoulder precautions, ice and edema management and donning/doffing sling. Patient and daughter verbalized understanding and demonstrated as needed. Patient needed assistance to donn shirt, underwear, pants, socks and shoes and provided with instruction on compensatory strategies to perform ADLs. Patient limited by decreased ROM in LT shoulder so therefore will need some form of assistance at home which has been arranged by pt's 2 daughters who will rotate to provide 24/7 assistance as pt is the primary caregiver for her husband. Patient and daughter verbalized and/or demonstrated understanding to all instruction. Patient to follow up with MD for further therapy needs.        Recommendations for follow up therapy are one component of a multi-disciplinary discharge planning process, led by the attending physician.  Recommendations may be updated based on patient status, additional functional criteria and insurance authorization.   Follow Up Recommendations  Follow physician's recommendations for discharge plan and follow up therapies    Assistance Recommended at Discharge Intermittent Supervision/Assistance  Patient can return home with the following A little help with bathing/dressing/bathroom;Assistance with cooking/housework    Functional  Status Assessment  Patient has had a recent decline in their functional status and demonstrates the ability to make significant improvements in function in a reasonable and predictable amount of time.  Equipment Recommendations  None recommended by OT    Recommendations for Other Services       Precautions / Restrictions Precautions Precautions: Shoulder Type of Shoulder Precautions: "Sling At all times except ADL / exercise  Non weight bearing Yes  AROM elbow, wrist and hand to tolerance Yes  AROM / PROM Forward Flexion other-see comments  AROM / PROM Abduction Other-see comments  AROM / PROM External Rotation Other-see comments  OT Consult Special Instructions no resisted internal rotation and no exercises for internal rotation " Shoulder Interventions: Shoulder sling/immobilizer;At all times;Off for dressing/bathing/exercises Precaution Booklet Issued: Yes (comment) Precaution Comments: "If sitting in controlled environment, ok to come out of sling to give neck a break. Please sleep in it to protect until follow up in office.     OK to use operative arm for feeding, hygiene and ADLs.   Ok to instruct Pendulums and lap slides as exercises. Ok to use operative arm within the following parameters for ADL purposes     New ROM (8/18)   Ok for PROM, AAROM, AROM within pain tolerance and within the following ROM   ER 20   ABD 45   FE 60" Required Braces or Orthoses: Sling Restrictions Weight Bearing Restrictions: Yes Other Position/Activity Restrictions: NWB LUE      Mobility Bed Mobility               General bed mobility comments: Received in recliner.    Transfers Overall transfer level: Modified independent                        Balance Overall balance  assessment: Modified Independent                                         ADL either performed or assessed with clinical judgement   ADL Overall ADL's : Needs assistance/impaired Eating/Feeding:  Set up   Grooming: Set up;Cueing for UE precautions   Upper Body Bathing: Minimal assistance;Cueing for compensatory techniques;Cueing for UE precautions;With caregiver independent assisting   Lower Body Bathing: Sit to/from stand;Cueing for compensatory techniques;Min guard;With caregiver independent assisting   Upper Body Dressing : Minimal assistance;Cueing for compensatory techniques;Cueing for UE precautions;With caregiver independent assisting   Lower Body Dressing: Minimal assistance;With caregiver independent assisting;Cueing for compensatory techniques   Toilet Transfer: Modified Independent   Toileting- Clothing Manipulation and Hygiene: Minimal assistance;Cueing for compensatory techniques Toileting - Clothing Manipulation Details (indicate cue type and reason): Cues to avoid any behind back ADLs with LUE due to rTSA precautions. Tub/ Shower Transfer: Supervision/safety;Walk-in shower   Functional mobility during ADLs: Modified independent       Vision Baseline Vision/History: 1 Wears glasses Ability to See in Adequate Light: 0 Adequate Vision Assessment?: No apparent visual deficits     Perception     Praxis      Pertinent Vitals/Pain Pain Assessment Pain Assessment: No/denies pain     Hand Dominance Right   Extremity/Trunk Assessment Upper Extremity Assessment Upper Extremity Assessment: LUE deficits/detail (RUE: WFL) LUE Deficits / Details: Post-op numbness. LUE: Unable to fully assess due to immobilization   Lower Extremity Assessment Lower Extremity Assessment: Overall WFL for tasks assessed   Cervical / Trunk Assessment Cervical / Trunk Assessment: Normal   Communication Communication Communication: No difficulties   Cognition Arousal/Alertness: Awake/alert Behavior During Therapy: WFL for tasks assessed/performed Overall Cognitive Status: Within Functional Limits for tasks assessed                                        General Comments       Exercises Other Exercises Other Exercises: Pt instructed in all parameters for P/AA/AROM of LT shoulder for ADLs and instructed in hand/wrist/forearm/elbow exercises as well as pendulums.   Shoulder Instructions Shoulder Instructions Donning/doffing shirt without moving shoulder: Minimal assistance;Patient able to independently direct caregiver;Caregiver independent with task Method for sponge bathing under operated UE: Minimal assistance;Caregiver independent with task;Patient able to independently direct caregiver Donning/doffing sling/immobilizer: Moderate assistance;Caregiver independent with task;Patient able to independently direct caregiver Correct positioning of sling/immobilizer: Independent;Caregiver independent with task;Patient able to independently direct caregiver Pendulum exercises (written home exercise program): Supervision/safety;Patient able to independently direct caregiver;Caregiver independent with task ROM for elbow, wrist and digits of operated UE: Caregiver independent with task;Patient able to independently direct caregiver (Able to demonstrate on RUE) Sling wearing schedule (on at all times/off for ADL's): Independent;Caregiver independent with task;Patient able to independently direct caregiver Proper positioning of operated UE when showering: Modified independent;Patient able to independently direct caregiver;Caregiver independent with task Dressing change: Moderate assistance;Caregiver independent with task;Patient able to independently direct caregiver Positioning of UE while sleeping: Independent;Caregiver independent with task;Patient able to independently direct caregiver    Home Living Family/patient expects to be discharged to:: Private residence Living Arrangements: Spouse/significant other (Pt is spouse's primary caregiver.  Pt to have 2 daughters.) Available Help at Discharge: Available 24 hours/day;Family (2 daughters to  rotate) Type of Home:  House Home Access: Level entry     Home Layout: Two level;Full bath on main level;Able to live on main level with bedroom/bathroom     Bathroom Shower/Tub: Occupational psychologist: Standard (shower door has bar on RT and grab bar in front of toilet)     Home Equipment: Grab bars - tub/shower;Shower seat - built in;Grab bars - toilet;Hand held shower head;Adaptive equipment Adaptive Equipment: Reacher;Long-handled shoe horn;Sock aid        Prior Functioning/Environment Prior Level of Function : Independent/Modified Independent;Driving                        OT Problem List: Decreased range of motion;Decreased strength;Impaired sensation      OT Treatment/Interventions:      OT Goals(Current goals can be found in the care plan section) Acute Rehab OT Goals Patient Stated Goal: Home today OT Goal Formulation: All assessment and education complete, DC therapy Potential to Achieve Goals: Good ADL Goals Pt/caregiver will Perform Home Exercise Program: With written HEP provided;Left upper extremity;With Supervision Additional ADL Goal #1: Pt will demonstrate UE/LE dressing, donning/doffing of sling, correct positioning of LT UE, and compensatory strategies for RT and LT axilla hygiene, all while correctly following all shoulder post-op precautions/restrictions.  OT Frequency:      Co-evaluation              AM-PAC OT "6 Clicks" Daily Activity     Outcome Measure Help from another person eating meals?: A Little Help from another person taking care of personal grooming?: A Little Help from another person toileting, which includes using toliet, bedpan, or urinal?: A Little Help from another person bathing (including washing, rinsing, drying)?: A Little Help from another person to put on and taking off regular upper body clothing?: A Lot Help from another person to put on and taking off regular lower body clothing?: A Little 6 Click  Score: 17   End of Session Equipment Utilized During Treatment:  (Sling) Nurse Communication: Other (comment) (Pt ready for discharge from OT perspective)  Activity Tolerance: Patient tolerated treatment well Patient left: in chair;with family/visitor present  OT Visit Diagnosis: Muscle weakness (generalized) (M62.81)                Time: 9417-4081 OT Time Calculation (min): 40 min Charges:  OT General Charges $OT Visit: 1 Visit OT Evaluation $OT Eval Low Complexity: 1 Low OT Treatments $Self Care/Home Management : 23-37 mins  Anderson Malta, OT Acute Rehab Services Office: [phone] 01/31/2022  Julien Girt 01/31/2022, 1:03 PM

## 2022-01-31 NOTE — H&P (Signed)
Nicole Suarez    Chief Complaint: Left shoulder rotator cuff tear arthropathy HPI: The patient is a 81 y.o. 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    History of urinary incontinence    History of UTI    Hyperlipidemia    Hypertension    Neuromuscular disorder (HCC)    restless legs    PONV (postoperative nausea and vomiting)       Past Surgical History:  Procedure Laterality Date   APPENDECTOMY  1963   CATARACT EXTRACTION, BILATERAL     08/2019, 09/2019   CESAREAN SECTION  1964   MICRODISCECTOMY LUMBAR     L3L4   RECTOCELE REPAIR  2009   REPLACEMENT TOTAL KNEE  2010   ROTATOR CUFF REPAIR  2000   urterine suspension  1971    Family History  Problem Relation Age of Onset   Early death Mother    Cancer Father    COPD Father    Lung cancer Father    Pancreatic cancer Sister    Alcohol abuse Brother    Heart disease Brother    Hypertension Brother    COPD Maternal Grandmother    Colon cancer Neg Hx    Esophageal cancer Neg Hx    Stomach cancer Neg Hx    Rectal cancer Neg Hx    Colon polyps Neg Hx     Social History:  reports that she has never smoked. She has never used smokeless tobacco. She reports that she does not currently use alcohol. She reports that she does not use  drugs.  BMI: Estimated body mass index is 25.18 kg/m as calculated from the following:   Height as of this encounter: 5' 4.5" (1.638 m).   Weight as of this encounter: 67.6 kg.  Lab Results  Component Value Date   ALBUMIN 4.2 12/19/2021   Diabetes: Patient does not have a diagnosis of diabetes. Lab Results  Component Value Date   HGBA1C 5.7 12/19/2021     Smoking Status:   reports that she has never smoked. She has never used smokeless tobacco.     Medications Prior to Admission  Medication Sig Dispense Refill   bromocriptine (PARLODEL) 2.5 MG tablet TAKE 1 TABLET DAILY 90 tablet 3   Calcium Carbonate-Vit D-Min (CALCIUM 1200 PO) Take 2 capsules by mouth daily.     carvedilol (COREG) 12.5 MG tablet Take 1 tablet (12.5 mg total) by mouth 2 (two) times daily with a meal. 180 tablet 3   chlorthalidone (HYGROTON) 25 MG tablet Take 0.5 tablets (12.5 mg total) by mouth daily. 45 tablet 3   Cholecalciferol (DIALYVITE VITAMIN D 5000) 125 MCG (5000 UT) capsule Take 5,000 Units by mouth 3 (three) times a week.     fluticasone (FLONASE) 50 MCG/ACT nasal spray  Place 2 sprays into both nostrils daily.     ibuprofen (ADVIL) 200 MG tablet Take 600 mg by mouth every 8 (eight) hours as needed for moderate pain.     levocetirizine (XYZAL) 5 MG tablet Take 5 mg by mouth every evening.     methocarbamol (ROBAXIN) 500 MG tablet Take 1 tablet (500 mg total) by mouth every 8 (eight) hours as needed for muscle spasms. Use caution of sedation (Patient taking differently: Take 500 mg by mouth at bedtime as needed for muscle spasms. Use caution of sedation) 30 tablet 1   Multiple Vitamin (MULTIVITAMIN) capsule Take 1 capsule by mouth daily.     Multiple Vitamins-Minerals (PRESERVISION AREDS 2 PO) Take 2 capsules by mouth daily.     MYRBETRIQ 50 MG TB24 tablet Take 50 mg by mouth daily.     OVER THE COUNTER MEDICATION Turmeric '1000mg'$  once daily     OVER THE COUNTER MEDICATION D-mannose- 1000 mg daily for  bladder health     pantoprazole (PROTONIX) 40 MG tablet Take 1 tablet (40 mg total) by mouth daily. 90 tablet 3   Polyethyl Glycol-Propyl Glycol (SYSTANE OP) Place 1 drop into both eyes daily as needed (dry eyes).     polyethylene glycol powder (GLYCOLAX/MIRALAX) 17 GM/SCOOP powder Take 17 g by mouth daily as needed for moderate constipation.     rosuvastatin (CRESTOR) 10 MG tablet Take 1 tablet (10 mg total) by mouth at bedtime. 90 tablet 3   senna (SENOKOT) 8.6 MG TABS tablet Take 1-2 tablets by mouth at bedtime as needed for mild constipation.     traMADol (ULTRAM) 50 MG tablet Take 50 mg by mouth at bedtime as needed for moderate pain.       Physical Exam: Left shoulder demonstrates painful and profoundly restricted motion as noted at her recent office visits.  She has globally decreased strength to manual muscle testing.  She is otherwise neurovascular intact in left upper extremity and examination is otherwise as documented at her recent office visits.  Radiographs  Plain films confirm changes consistent with chronic left shoulder rotator cuff tear arthropathy.  Vitals  Temp:  [98.1 F (36.7 C)] 98.1 F (36.7 C) (07/20 0549) Pulse Rate:  [70] 70 (07/20 0549) Resp:  [16] 16 (07/20 0549) BP: (143)/(69) 143/69 (07/20 0549) SpO2:  [97 %] 97 % (07/20 0549) Weight:  [67.6 kg] 67.6 kg (07/20 0549)  Assessment/Plan  Impression: Left shoulder rotator cuff tear arthropathy  Plan of Action: Procedure(s): REVERSE SHOULDER ARTHROPLASTY  Nicole Suarez 01/31/2022, 6:09 AM Contact # [phone]

## 2022-01-31 NOTE — Anesthesia Postprocedure Evaluation (Signed)
Anesthesia Post Note  Patient: Nicole Suarez  Procedure(s) Performed: REVERSE SHOULDER ARTHROPLASTY (Left: Shoulder)     Patient location during evaluation: PACU Anesthesia Type: General Level of consciousness: awake and alert Pain management: pain level controlled Vital Signs Assessment: post-procedure vital signs reviewed and stable Respiratory status: spontaneous breathing, nonlabored ventilation, respiratory function stable and patient connected to nasal cannula oxygen Cardiovascular status: blood pressure returned to baseline and stable Postop Assessment: no apparent nausea or vomiting Anesthetic complications: no   No notable events documented.  Last Vitals:  Vitals:   01/31/22 1030 01/31/22 1045  BP: (!) 129/55 (!) 125/57  Pulse: 65 65  Resp: 18   Temp:    SpO2: 98% 98%    Last Pain:  Vitals:   01/31/22 1045  TempSrc:   PainSc: 0-No pain                 Effie Berkshire

## 2022-01-31 NOTE — Transfer of Care (Signed)
Immediate Anesthesia Transfer of Care Note  Patient: Nicole Suarez  Procedure(s) Performed: Procedure(s): REVERSE SHOULDER ARTHROPLASTY (Left)  Patient Location: PACU  Anesthesia Type:General and Regional  Level of Consciousness: Patient easily awoken, sedated, comfortable, cooperative, following commands, responds to stimulation.   Airway & Oxygen Therapy: Patient spontaneously breathing, ventilating well, oxygen via simple oxygen mask.  Post-op Assessment: Report given to PACU RN, vital signs reviewed and stable, moving all extremities.   Post vital signs: Reviewed and stable.  Complications: No apparent anesthesia complications  Last Vitals:  Vitals Value Taken Time  BP 143/59 01/31/22 0924  Temp    Pulse 66 01/31/22 0924  Resp 18 01/31/22 0924  SpO2 100 % 01/31/22 0924  Vitals shown include unvalidated device data.  Last Pain:  Vitals:   01/31/22 0549  TempSrc: Oral  PainSc: 2       Patients Stated Pain Goal: 2 (64/84/72 0721)  Complications: No notable events documented.

## 2022-01-31 NOTE — Discharge Instructions (Signed)

## 2022-01-31 NOTE — Op Note (Signed)
01/31/2022  9:07 AM  PATIENT:   Nicole Suarez  81 y.o. female  PRE-OPERATIVE DIAGNOSIS:  Left shoulder rotator cuff tear arthropathy  POST-OPERATIVE DIAGNOSIS: Same  PROCEDURE: Left shoulder reverse arthroplasty utilizing a press-fit size 11 Arthrex stem with a neutral metaphysis, +3 polyethylene insert, 36/+4 glenosphere and a small/+2 baseplate  SURGEON:  Muadh Creasy, Metta Clines M.D.  ASSISTANTS: Jenetta Loges, PA-C  ANESTHESIA:   General endotracheal and interscalene block with Exparel  EBL: 150 cc  SPECIMEN: None  Drains: None   PATIENT DISPOSITION:  PACU - hemodynamically stable.    PLAN OF CARE: Discharge to home after PACU  Brief history:  Patient is a 81 year old female who has been followed for chronic and progressively increasing left shoulder pain related to severe arthritis and associated rotator cuff dysfunction.  Her symptoms have now deteriorated to the point that she is having a significant impact on her quality of life and ability to perform activities of daily living.  She is brought to the operating this time for planned left shoulder reverse arthroplasty.  Preoperatively, I counseled the patient regarding treatment options and risks versus benefits thereof.  Possible surgical complications were all reviewed including potential for bleeding, infection, neurovascular injury, persistent pain, loss of motion, anesthetic complication, failure of the implant, and possible need for additional surgery. They understand and accept and agrees with our planned procedure.   Procedure detail:  After undergoing routine preop evaluation the patient received prophylactic antibiotics and interscalene block with Exparel was established in the holding area by the anesthesia department.  Patient subsequently placed supine on the operating table and underwent the smooth induction of the general endotracheal anesthesia.  Placed into the beachchair position and appropriately padded and  protected.  The left shoulder girdle region was sterilely prepped and draped in standard fashion.  Timeout was called.  A deltopectoral approach was made to the left shoulder through an approximate centimeter incision.  Skin flaps were elevated dissection carried deeply the deltopectoral interval was then developed from proximal to distal with the vein taken laterally.  The upper centimeter the pectoralis major was then divided for exposure of the conjoined tendon was mobilized and retracted medially.  The long head biceps tendon was then tenodesed at the upper border the pectoralis major tendon with the proximal segment unroofed and excised.  The superior remnant of the rotator cuff was then split from the apex of the bicipital groove to the base of the coracoid and the subscap was then separated from the lesser tuberosity with electrocautery and margin was tagged with a pair of suture tape sutures.  Capsular attachments were then divided from the anterior and inferior margins of the humeral neck and the humeral head was delivered through the wound.  An extra medullary guide was then used to outline a proposed humeral head resection which were performed with an oscillating saw at approximately 20 degrees of retroversion and the large osteophytes on the humeral neck were removed with a rondure.  A metal cap was then placed over the cut proximal humeral surface we then exposed the glenoid and a circumferential labral resection was performed.  Complete visualization of the peripheral glenoid was achieved and a guidepin was then directed into the center of the glenoid and the glenoid was then reamed with the central followed by the peripheral reamer to a stable subchondral bony bed.  Preparation completed with central drill and tapped for a 25 mm lag screw.  Our baseplate was then assembled and inserted with  vancomycin powder applied to the threads of the lag screw and excellent purchase and fixation was achieved.  The  peripheral locking screws were all then placed using standard technique with excellent fixation.  A 36/+4 glenosphere was then impacted onto the baseplate and the central locking screw was placed.  Attention returned to the humeral metaphysis where the canal was opened by hand reaming and ultimately broached up to a size 11 at 20 degrees of retroversion.  A neutral metaphyseal reaming guide was then used to prepare the metaphysis.  A trial implant was then placed showing good fixation motion stability and soft tissue balance all much to our satisfaction.  Trial implant was then removed the final implant was assembled.  The canal was cleaned and dried with vancomycin powder applied to the canal.  The final implant was then seated with excellent fixation.  Trial reduction showed good motion stability and soft tissue balance with the +3 poly-.  The trial was then removed the implant was cleaned and dried the final +3 poly was impacted and final reduction was then performed which again showed excellent motion stability and soft tissue balance all much to our satisfaction.  The wound was copious irrigated.  Final hemostasis was obtained.  Subscapularis mobility was confirmed it was repaired back to the eyelets on the collar the implant using the previously placed suture tape sutures.  Balance of the vancomycin powder was then spread liberally throughout the deep soft tissue planes.  The deltopectoral interval was reapproximated with a series of figure-of-eight number Vicryl sutures.  2-0 Monocryl used to close the subcu layer and intracuticular 3-0 Monocryl used to close the skin followed by Dermabond and Aquacel dressing.  The left arm was then placed into a sling and the patient was awakened, extubated, and taken to the recovery room in stable condition.  Jenetta Loges, PA-C was utilized as an Environmental consultant throughout this case, essential for help with positioning the patient, positioning extremity, tissue manipulation,  implantation of the prosthesis, suture management, wound closure, and intraoperative decision-making.  Marin Shutter MD   Contact # [phone]

## 2022-01-31 NOTE — Anesthesia Procedure Notes (Signed)
Anesthesia Regional Block: Interscalene brachial plexus block   Pre-Anesthetic Checklist: , timeout performed,  Correct Patient, Correct Site, Correct Laterality,  Correct Procedure, Correct Position, site marked,  Risks and benefits discussed,  Surgical consent,  Pre-op evaluation,  At surgeon's request and post-op pain management  Laterality: Left  Prep: chloraprep       Needles:  Injection technique: Single-shot  Needle Type: Echogenic Stimulator Needle     Needle Length: 9cm  Needle Gauge: 21     Additional Needles:   Procedures:,,,, ultrasound used (permanent image in chart),,    Narrative:  Start time: 01/31/2022 7:10 AM End time: 01/31/2022 7:15 AM Injection made incrementally with aspirations every 5 mL.  Performed by: Personally  Anesthesiologist: Effie Berkshire, MD  Additional Notes: Patient tolerated the procedure well. Local anesthetic introduced in an incremental fashion under minimal resistance after negative aspirations. No paresthesias were elicited. After completion of the procedure, no acute issues were identified and patient continued to be monitored by RN.

## 2022-01-31 NOTE — Anesthesia Preprocedure Evaluation (Addendum)
Anesthesia Evaluation  Patient identified by MRN, date of birth, ID band Patient awake    Reviewed: Allergy & Precautions, NPO status , Patient's Chart, lab work & pertinent test results  History of Anesthesia Complications (+) PONV and history of anesthetic complications  Airway Mallampati: II  TM Distance: >3 FB Neck ROM: Full    Dental  (+) Teeth Intact, Dental Advisory Given   Pulmonary neg pulmonary ROS,    breath sounds clear to auscultation       Cardiovascular hypertension,  Rhythm:Regular Rate:Normal     Neuro/Psych negative psych ROS   GI/Hepatic Neg liver ROS, GERD  ,  Endo/Other  negative endocrine ROS  Renal/GU negative Renal ROS     Musculoskeletal  (+) Arthritis ,   Abdominal Normal abdominal exam  (+)   Peds  Hematology negative hematology ROS (+)   Anesthesia Other Findings   Reproductive/Obstetrics                            Anesthesia Physical Anesthesia Plan  ASA: 2  Anesthesia Plan: General   Post-op Pain Management: Regional block*   Induction: Intravenous  PONV Risk Score and Plan: 4 or greater and Ondansetron and Treatment may vary due to age or medical condition  Airway Management Planned: Oral ETT  Additional Equipment: None  Intra-op Plan:   Post-operative Plan: Extubation in OR  Informed Consent: I have reviewed the patients History and Physical, chart, labs and discussed the procedure including the risks, benefits and alternatives for the proposed anesthesia with the patient or authorized representative who has indicated his/her understanding and acceptance.       Plan Discussed with: CRNA  Anesthesia Plan Comments:        Anesthesia Quick Evaluation

## 2022-02-04 ENCOUNTER — Encounter (HOSPITAL_COMMUNITY): Payer: Self-pay | Admitting: Orthopedic Surgery

## 2022-04-24 ENCOUNTER — Telehealth: Payer: Self-pay | Admitting: Family Medicine

## 2022-04-24 MED ORDER — TRAZODONE HCL 50 MG PO TABS: 25.0000 mg | ORAL_TABLET | Freq: Every evening | ORAL | 0 refills | Status: DC | PRN

## 2022-04-24 NOTE — Telephone Encounter (Signed)
I sent trazodone to her pharmacy  Use with caution especially if she is taking tramadol (they can amplify each other)  Hope it helps , let me know if not  Let us know if she needs anything else

## 2022-04-24 NOTE — Telephone Encounter (Signed)
Pt notified Rx sent to pharmacy and advised of PCP's comments

## 2022-04-24 NOTE — Telephone Encounter (Signed)
Pt is requesting medication to help with sleeping at night? Call back # [phone]

## 2022-05-06 MED ORDER — CLONAZEPAM 0.5 MG PO TABS: ORAL_TABLET | ORAL | 0 refills | Status: DC

## 2022-05-06 NOTE — Telephone Encounter (Signed)
Pt notified of Dr. Marliss Coots instructions and verbalized understanding. Pt will keep [place] posted

## 2022-05-06 NOTE — Addendum Note (Signed)
Addended by: Loura Pardon A on: 05/06/2022 04:56 PM   Modules accepted: Orders

## 2022-05-06 NOTE — Telephone Encounter (Signed)
Trouble going to sleep , staying asleep or both?  How is she doing with grief overall? How's she eating and drinking?  (I suspect appetite is off)   Thanks

## 2022-05-06 NOTE — Telephone Encounter (Signed)
Trouble going to sleep , staying asleep or both? Both How is she doing with grief overall? 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  she's eating and drinking ok

## 2022-05-06 NOTE — Telephone Encounter (Signed)
Pt called requesting a call back stated   RX traZODone (DESYREL) 50 MG tablet is not working wanted to make PCP aware . Please advise # [phone]

## 2022-05-06 NOTE — Telephone Encounter (Signed)
We can try a very short course of klonopin with caution (it is habit forming and sedating and can cause falls)  No more than 7-10 days but it may also help with RLS  Try not to mix with pain medication    Sent to her pharmacy   Follow up here when you can please to check in

## 2022-05-13 ENCOUNTER — Other Ambulatory Visit: Payer: Self-pay | Admitting: Family Medicine

## 2022-05-20 ENCOUNTER — Telehealth: Payer: Self-pay | Admitting: Family Medicine

## 2022-05-20 NOTE — Telephone Encounter (Signed)
Left message for patient to call back and schedule Medicare Annual Wellness Visit (AWV) either virtually or in office. Left  my Nicole Suarez number [phone]  Awvs 05/30/21 please schedule with Nurse Health Adviser   45 min for awv-i and in office appointments 30 min for awv-s  phone/virtual appointments

## 2022-06-04 ENCOUNTER — Other Ambulatory Visit: Payer: Self-pay | Admitting: Family Medicine

## 2022-06-17 ENCOUNTER — Other Ambulatory Visit: Payer: Self-pay | Admitting: Family Medicine

## 2022-07-15 ENCOUNTER — Other Ambulatory Visit: Payer: Self-pay | Admitting: Family Medicine

## 2022-07-16 NOTE — Telephone Encounter (Signed)
Last OV was a surgical clearance on 12/19/21, last filled on 07/20/21 # 90 tabs with 3 refill

## 2022-08-21 ENCOUNTER — Other Ambulatory Visit: Payer: Self-pay | Admitting: Family Medicine

## 2022-08-21 ENCOUNTER — Ambulatory Visit: Payer: Medicare HMO | Admitting: Dermatology

## 2022-08-21 DIAGNOSIS — D485 Neoplasm of uncertain behavior of skin: Secondary | ICD-10-CM | POA: Diagnosis not present

## 2022-08-21 DIAGNOSIS — Z85828 Personal history of other malignant neoplasm of skin: Secondary | ICD-10-CM | POA: Diagnosis not present

## 2022-08-21 DIAGNOSIS — L814 Other melanin hyperpigmentation: Secondary | ICD-10-CM | POA: Diagnosis not present

## 2022-08-21 DIAGNOSIS — D492 Neoplasm of unspecified behavior of bone, soft tissue, and skin: Secondary | ICD-10-CM

## 2022-08-21 DIAGNOSIS — L821 Other seborrheic keratosis: Secondary | ICD-10-CM

## 2022-08-21 DIAGNOSIS — L578 Other skin changes due to chronic exposure to nonionizing radiation: Secondary | ICD-10-CM

## 2022-08-21 DIAGNOSIS — L570 Actinic keratosis: Secondary | ICD-10-CM | POA: Diagnosis not present

## 2022-08-21 DIAGNOSIS — Z1283 Encounter for screening for malignant neoplasm of skin: Secondary | ICD-10-CM

## 2022-08-21 DIAGNOSIS — D229 Melanocytic nevi, unspecified: Secondary | ICD-10-CM

## 2022-08-21 NOTE — Patient Instructions (Addendum)
Wound Care Instructions  Cleanse wound gently with soap and water once a day then pat dry with clean gauze. Apply a thin coat of Petrolatum (petroleum jelly, "Vaseline") over the wound (unless you have an allergy to this). We recommend that you use a new, sterile tube of Vaseline. Do not pick or remove scabs. Do not remove the yellow or white "healing tissue" from the base of the wound.  Cover the wound with fresh, clean, nonstick gauze and secure with paper tape. You may use Band-Aids in place of gauze and tape if the wound is small enough, but would recommend trimming much of the tape off as there is often too much. Sometimes Band-Aids can irritate the skin.  You should call the office for your biopsy report after 1 week if you have not already been contacted.  If you experience any problems, such as abnormal amounts of bleeding, swelling, significant bruising, significant pain, or evidence of infection, please call the office immediately.  FOR ADULT SURGERY PATIENTS: If you need something for pain relief you may take 1 extra strength Tylenol (acetaminophen) AND 2 Ibuprofen (200mg each) together every 4 hours as needed for pain. (do not take these if you are allergic to them or if you have a reason you should not take them.) Typically, you may only need pain medication for 1 to 3 days.   Melanoma ABCDEs  Melanoma is the most dangerous type of skin cancer, and is the leading cause of death from skin disease.  You are more likely to develop melanoma if you: Have light-colored skin, light-colored eyes, or red or blond hair Spend a lot of time in the sun Tan regularly, either outdoors or in a tanning bed Have had blistering sunburns, especially during childhood Have a close family member who has had a melanoma Have atypical moles or large birthmarks  Early detection of melanoma is key since treatment is typically straightforward and cure rates are extremely high if we catch it early.   The  first sign of melanoma is often a change in a mole or a new dark spot.  The ABCDE system is a way of remembering the signs of melanoma.  A for asymmetry:  The two halves do not match. B for border:  The edges of the growth are irregular. C for color:  A mixture of colors are present instead of an even brown color. D for diameter:  Melanomas are usually (but not always) greater than 6mm - the size of a pencil eraser. E for evolution:  The spot keeps changing in size, shape, and color.  Please check your skin once per month between visits. You can use a small mirror in front and a large mirror behind you to keep an eye on the back side or your body.   If you see any new or changing lesions before your next follow-up, please call to schedule a visit.  Please continue daily skin protection including broad spectrum sunscreen SPF 30+ to sun-exposed areas, reapplying every 2 hours as needed when you're outdoors.    Due to recent changes in healthcare laws, you may see results of your pathology and/or laboratory studies on MyChart before the doctors have had a chance to review them. We understand that in some cases there may be results that are confusing or concerning to you. Please understand that not all results are received at the same time and often the doctors may need to interpret multiple results in order to provide you   with the best plan of care or course of treatment. Therefore, we ask that you please give us 2 business days to thoroughly review all your results before contacting the office for clarification. Should we see a critical lab result, you will be contacted sooner.   If You Need Anything After Your Visit  If you have any questions or concerns for your doctor, please call our main line at [phone] and press option 4 to reach your doctor's medical assistant. If no one answers, please leave a voicemail as directed and we will return your call as soon as possible. Messages left after 4  pm will be answered the following business day.   You may also send us a message via MyChart. We typically respond to MyChart messages within 1-2 business days.  For prescription refills, please ask your pharmacy to contact our office. Our fax number is [phone].  If you have an urgent issue when the clinic is closed that cannot wait until the next business day, you can page your doctor at the number below.    Please note that while we do our best to be available for urgent issues outside of office hours, we are not available 24/7.   If you have an urgent issue and are unable to reach us, you may choose to seek medical care at your doctor's office, retail clinic, urgent care center, or emergency room.  If you have a medical emergency, please immediately call 911 or go to the emergency department.  Pager Numbers  - Dr. Kowalski: [phone]  - Dr. Moye: [phone]  - Dr. Stewart: [phone]  In the event of inclement weather, please call our main line at [phone] for an update on the status of any delays or closures.  Dermatology Medication Tips: Please keep the boxes that topical medications come in in order to help keep track of the instructions about where and how to use these. Pharmacies typically print the medication instructions only on the boxes and not directly on the medication tubes.   If your medication is too expensive, please contact our office at [phone] option 4 or send us a message through MyChart.   We are unable to tell what your co-pay for medications will be in advance as this is different depending on your insurance coverage. However, we may be able to find a substitute medication at lower cost or fill out paperwork to get insurance to cover a needed medication.   If a prior authorization is required to get your medication covered by your insurance company, please allow us 1-2 business days to complete this process.  Drug prices often vary  depending on where the prescription is filled and some pharmacies may offer cheaper prices.  The website www.goodrx.com contains coupons for medications through different pharmacies. The prices here do not account for what the cost may be with help from insurance (it may be cheaper with your insurance), but the website can give you the price if you did not use any insurance.  - You can print the associated coupon and take it with your prescription to the pharmacy.  - You may also stop by our office during regular business hours and pick up a GoodRx coupon card.  - If you need your prescription sent electronically to a different pharmacy, notify our office through [place] MyChart or by phone at [phone] option 4.     Si Usted Necesita Algo Despus de Su Visita  Tambin puede enviarnos un mensaje a travs   de MyChart. Por lo general respondemos a los mensajes de MyChart en el transcurso de 1 a 2 das hbiles.  Para renovar recetas, por favor pida a su farmacia que se ponga en contacto con nuestra oficina. Nuestro nmero de fax es el [phone].  Si tiene un asunto urgente cuando la clnica est cerrada y que no puede esperar hasta el siguiente da hbil, puede llamar/localizar a su doctor(a) al nmero que aparece a continuacin.   Por favor, tenga en cuenta que aunque hacemos todo lo posible para estar disponibles para asuntos urgentes fuera del horario de oficina, no estamos disponibles las 24 horas del da, los 7 das de la semana.   Si tiene un problema urgente y no puede comunicarse con nosotros, puede optar por buscar atencin mdica  en el consultorio de su doctor(a), en una clnica privada, en un centro de atencin urgente o en una sala de emergencias.  Si tiene una emergencia mdica, por favor llame inmediatamente al 911 o vaya a la sala de emergencias.  Nmeros de bper  - Dr. Kowalski: [phone]  - Dra. Moye: [phone]  - Dra. Stewart: [phone]  En caso de  inclemencias del tiempo, por favor llame a nuestra lnea principal al [phone] para una actualizacin sobre el estado de cualquier retraso o cierre.  Consejos para la medicacin en dermatologa: Por favor, guarde las cajas en las que vienen los medicamentos de uso tpico para ayudarle a seguir las instrucciones sobre dnde y cmo usarlos. Las farmacias generalmente imprimen las instrucciones del medicamento slo en las cajas y no directamente en los tubos del medicamento.   Si su medicamento es muy caro, por favor, pngase en contacto con nuestra oficina llamando al [phone] y presione la opcin 4 o envenos un mensaje a travs de MyChart.   No podemos decirle cul ser su copago por los medicamentos por adelantado ya que esto es diferente dependiendo de la cobertura de su seguro. Sin embargo, es posible que podamos encontrar un medicamento sustituto a menor costo o llenar un formulario para que el seguro cubra el medicamento que se considera necesario.   Si se requiere una autorizacin previa para que su compaa de seguros cubra su medicamento, por favor permtanos de 1 a 2 das hbiles para completar este proceso.  Los precios de los medicamentos varan con frecuencia dependiendo del lugar de dnde se surte la receta y alguna farmacias pueden ofrecer precios ms baratos.  El sitio web www.goodrx.com tiene cupones para medicamentos de diferentes farmacias. Los precios aqu no tienen en cuenta lo que podra costar con la ayuda del seguro (puede ser ms barato con su seguro), pero el sitio web puede darle el precio si no utiliz ningn seguro.  - Puede imprimir el cupn correspondiente y llevarlo con su receta a la farmacia.  - Tambin puede pasar por nuestra oficina durante el horario de atencin regular y recoger una tarjeta de cupones de GoodRx.  - Si necesita que su receta se enve electrnicamente a una farmacia diferente, informe a nuestra oficina a travs de MyChart de [place] o  por telfono llamando al [phone] y presione la opcin 4.  

## 2022-08-21 NOTE — Progress Notes (Unsigned)
Follow-Up Visit   Subjective  Nicole Suarez is a 82 y.o. female who presents for the following: FBSE (Hx SCC, BCC. Patient has a spot at nose that she would like checked. ).  The patient presents for Total-Body Skin Exam (TBSE) for skin cancer screening and mole check.  The patient has spots, moles and lesions to be evaluated, some may be new or changing and the patient has concerns that these could be cancer.   The following portions of the chart were reviewed this encounter and updated as appropriate:   Tobacco  Allergies  Meds  Problems  Med Hx  Surg Hx  Fam Hx      Review of Systems:  No other skin or systemic complaints except as noted in HPI or Assessment and Plan.  Objective  Well appearing patient in no apparent distress; mood and affect are within normal limits.  A full examination was performed including scalp, head, eyes, ears, nose, lips, neck, chest, axillae, abdomen, back, buttocks, bilateral upper extremities, bilateral lower extremities, hands, feet, fingers, toes, fingernails, and toenails. All findings within normal limits unless otherwise noted below.  Left Forearm Pink papule 0.6 cm       left lower eyelid 1.0 cm brown patch        Assessment & Plan  Neoplasm of skin Left Forearm  Skin / nail biopsy Type of biopsy: tangential   Informed consent: discussed and consent obtained   Timeout: patient name, date of birth, surgical site, and procedure verified   Procedure prep:  Patient was prepped and draped in usual sterile fashion Prep type:  Isopropyl alcohol Anesthesia: the lesion was anesthetized in a standard fashion   Anesthetic:  1% lidocaine w/ epinephrine 1-100,000 buffered w/ 8.4% NaHCO3 Instrument used: flexible razor blade   Hemostasis achieved with: aluminum chloride   Outcome: patient tolerated procedure well   Post-procedure details: wound care instructions given   Additional details:  Petrolatum and a pressure bandage  applied  Specimen 1 - Surgical pathology Differential Diagnosis: r/o ISK vs Lichenoid Keratosis > Amelanotic  Check Margins: No Pink papule 0.6 cm  Lentigines left lower eyelid  Benign-appearing.  Observation.  Call clinic for new or changing lesions.  Recommend daily use of broad spectrum spf 30+ sunscreen to sun-exposed areas.   Monitor for changes   History of Basal Cell Carcinoma of the Skin - No evidence of recurrence today - Recommend regular full body skin exams - Recommend daily broad spectrum sunscreen SPF 30+ to sun-exposed areas, reapply every 2 hours as needed.  - Call if any new or changing lesions are noted between office visits  History of Squamous Cell Carcinoma of the Skin - No evidence of recurrence today - No lymphadenopathy - Recommend regular full body skin exams - Recommend daily broad spectrum sunscreen SPF 30+ to sun-exposed areas, reapply every 2 hours as needed.  - Call if any new or changing lesions are noted between office visits   Actinic Keratoses  Actinic keratoses are precancerous spots that appear secondary to cumulative UV radiation exposure/sun exposure over time. They are chronic with expected duration over 1 year. A portion of actinic keratoses will progress to squamous cell carcinoma of the skin. It is not possible to reliably predict which spots will progress to skin cancer and so treatment is recommended to prevent development of skin cancer.  Recommend daily broad spectrum sunscreen SPF 30+ to sun-exposed areas, reapply every 2 hours as needed.  Recommend staying in the shade  or wearing long sleeves, sun glasses (UVA+UVB protection) and wide brim hats (4-inch brim around the entire circumference of the hat). Call for new or changing lesions.   Prior to procedure, discussed risks of blister formation, small wound, skin dyspigmentation, or rare scar following cryotherapy. Recommend Vaseline ointment to treated areas while  healing.  Procedure Note: Cryotherapy Destruction method: cryotherapy   Informed consent: discussed and consent obtained   Timeout:  patient name, date of birth, surgical site, and procedure verified Lesion destroyed using liquid nitrogen: Yes   Region frozen until ice ball extended beyond lesion: Yes   Outcome: patient tolerated procedure well with no complications   Post-procedure details: wound care instructions given   Locations Treated: within SK at right nasal dorsum x 1 # Treated: 1   Lentigines - Scattered tan macules - Due to sun exposure - Benign-appearing, observe - Recommend daily broad spectrum sunscreen SPF 30+ to sun-exposed areas, reapply every 2 hours as needed. - Call for any changes  Seborrheic Keratoses - Stuck-on, waxy, tan-brown papules and/or plaques  - Benign-appearing - Discussed benign etiology and prognosis. - Observe - Call for any changes  Melanocytic Nevi - Tan-brown and/or pink-flesh-colored symmetric macules and papules - Benign appearing on exam today - Observation - Call clinic for new or changing moles - Recommend daily use of broad spectrum spf 30+ sunscreen to sun-exposed areas.   Hemangiomas - Red papules - Discussed benign nature - Observe - Call for any changes  Actinic Damage - Chronic condition, secondary to cumulative UV/sun exposure - diffuse scaly erythematous macules with underlying dyspigmentation - Recommend daily broad spectrum sunscreen SPF 30+ to sun-exposed areas, reapply every 2 hours as needed.  - Staying in the shade or wearing long sleeves, sun glasses (UVA+UVB protection) and wide brim hats (4-inch brim around the entire circumference of the hat) are also recommended for sun protection.  - Call for new or changing lesions.  Skin cancer screening performed today.  Return in about 1 year (around 08/22/2023) for TBSE, Hx BCC, Hx AK, Hx SCC.  Graciella Belton, RMA, am acting as scribe for Forest Gleason, MD  .  Documentation: I have reviewed the above documentation for accuracy and completeness, and I agree with the above.  Forest Gleason, MD

## 2022-08-22 ENCOUNTER — Encounter: Payer: Self-pay | Admitting: Dermatology

## 2022-08-28 ENCOUNTER — Telehealth: Payer: Self-pay

## 2022-08-28 NOTE — Telephone Encounter (Signed)
Advised patient of results/hd 

## 2022-08-28 NOTE — Telephone Encounter (Signed)
-----   Message from Alfonso Patten, MD sent at 08/28/2022  1:28 PM EST ----- Skin , left forearm LICHENOID KERATOSIS "inflamed freckle" --> no treatment needed  MAs please call. Thank you!

## 2022-09-09 LAB — HM MAMMOGRAPHY

## 2022-09-10 LAB — HM PAP SMEAR: HM Pap smear: NEGATIVE

## 2022-10-27 ENCOUNTER — Encounter: Payer: Self-pay | Admitting: Family Medicine

## 2022-10-28 ENCOUNTER — Encounter: Payer: Self-pay | Admitting: Family Medicine

## 2022-10-28 ENCOUNTER — Ambulatory Visit (INDEPENDENT_AMBULATORY_CARE_PROVIDER_SITE_OTHER): Payer: Medicare HMO | Admitting: Family Medicine

## 2022-10-28 VITALS — BP 150/80 | HR 83 | Temp 97.7°F | Ht 64.5 in | Wt 174.5 lb

## 2022-10-28 DIAGNOSIS — I10 Essential (primary) hypertension: Secondary | ICD-10-CM | POA: Diagnosis not present

## 2022-10-28 DIAGNOSIS — R42 Dizziness and giddiness: Secondary | ICD-10-CM | POA: Diagnosis not present

## 2022-10-28 DIAGNOSIS — F4321 Adjustment disorder with depressed mood: Secondary | ICD-10-CM

## 2022-10-28 DIAGNOSIS — F4320 Adjustment disorder, unspecified: Secondary | ICD-10-CM | POA: Insufficient documentation

## 2022-10-28 MED ORDER — AMLODIPINE BESYLATE 5 MG PO TABS: 5.0000 mg | ORAL_TABLET | Freq: Every day | ORAL | 0 refills | Status: DC

## 2022-10-28 NOTE — Assessment & Plan Note (Addendum)
Bp is not at goal BP: (!) 150/80   Wt gain noted Also stress of losing husband this fall  Disc lifestyle change as tolerated Enc strongly to minimize the 600 mg ibuprofen and try 400 mg only if abs necessary   Will add amlodipine 5 mg daily  Inst to call if side effects or problems  Continue Coreg 12.5 mg bid Chlorthalidone 12.5 mg daily (hesitant to inc this in light of prev dehydration but is option later) F/u in 2-3 wk

## 2022-10-28 NOTE — Patient Instructions (Addendum)
Minimize the ibuprofen if you can  Try 400 and only take when you really need it   Continue the flonase to prevent ear congestion which can lead to vertigo   If dizziness returns please let us know asap  Aim for 60 oz of fluids a day -mostly water   Continue current medicines Add amlodipine 5 mg daily (for blood pressure)  Any side effects- hold and let us know   Follow up in 3-4 weeks for blood pressure visit    Take care of yourself

## 2022-10-28 NOTE — Assessment & Plan Note (Signed)
Lost husb unexpectedly in the fall  Overall doing well  Reviewed stressors/ coping techniques/symptoms/ support sources/ tx options and side effects in detail today Traveling /staying busy and social  Offered counseling if needed Expect sleep to improve with time

## 2022-10-28 NOTE — Assessment & Plan Note (Signed)
Pt had a brief episode of positional dizziness after moving head to the side at a church function  Felt like spinning Improved much after drinking fluids Entirely resolved Reassuring exam /no neuro changes Disc poss of peripheral vertigo linked to ear fullness  Will continue flonase Alert [place] if sympt return  ER precautions noted in detail Consider imaging or spec ref if this returns

## 2022-10-28 NOTE — Progress Notes (Signed)
Subjective:    Patient ID: Nicole Suarez, female    DOB: Oct 29, 1940, 82 y.o.   MRN: [id number]  HPI Pt presents for c/o dizziness Lost her husband very suddenly since last visit  She also had shoulder replacement surgery   Wt Readings from Last 3 Encounters:  10/28/22 174 lb 8 oz (79.2 kg)  01/31/22 149 lb (67.6 kg)  01/22/22 149 lb (67.6 kg)   29.49 kg/m  Vitals:   10/28/22 1536  BP: (!) 146/82  Pulse: 83  Temp: 97.7 F (36.5 C)  SpO2: 98%   Grief- getting through  Some relief that he is no longer in pain  Very much missing him  [place] is very stressful but done with most of it   Fair self care  Would like to loose the weight she lost  A lot of food and change in schedule - with funeral/ etc   Has been traveling - [place] , [place], [place] and [place] A lot of family events  Cherly Hensen are visiting from [place] and they will be traveling within the [place]   Once things settle down she may volunteer at the hospital    Pt notes she was at an event on 4/13 (church) Turned her head and suddenly felt dizzy It felt like a wave  Then drank a bottle of water and felt better   Bp has been elevated as well  Had vertigo years ago and it lasted longer   Now back to 140/76 average  Goes up with stress    Advil Is on med list  She takes 600 mg twice daily for her arthritis (occ mid day dose)   Occ tylenol in between    HTN BP Readings from Last 3 Encounters:  10/28/22 (!) 150/80  01/31/22 (!) 125/57  01/22/22 (!) 144/85    Coreg 12.5 mg bid Chlorthalidone 12.5 mg daily   Pulse Readings from Last 3 Encounters:  10/28/22 83  01/31/22 65  01/22/22 70    Patient Active Problem List   Diagnosis Date Noted   Dizziness 10/28/2022   Pelvic pain in female 12/19/2021   Current use of proton pump inhibitor 05/24/2021   Low back pain 09/13/2020   Estrogen deficiency 04/26/2020   Elevated glucose level 04/26/2020   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    GERD (gastroesophageal reflux disease) 07/26/2019   Essential hypertension 07/26/2019   Hyperlipidemia 07/26/2019   Allergic rhinitis 07/26/2019   Urge incontinence 07/26/2019   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   RLS (restless legs syndrome) 07/26/2019   Colon polyp 07/26/2019   Osteopenia 07/26/2019   History of Mohs micrographic surgery for skin cancer 07/26/2019   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    History of urinary incontinence    History of UTI    Hyperlipidemia    Hypertension    Neuromuscular disorder    restless legs    PONV (postoperative nausea and vomiting)    Past Surgical History:  Procedure Laterality Date   APPENDECTOMY  1963   CATARACT EXTRACTION,  BILATERAL     08/2019, 09/2019   CESAREAN SECTION  1964   MICRODISCECTOMY LUMBAR     L3L4   RECTOCELE REPAIR  2009   REPLACEMENT TOTAL KNEE  2010   REVERSE SHOULDER ARTHROPLASTY Left 01/31/2022   Procedure: REVERSE SHOULDER ARTHROPLASTY;  Surgeon: Francena Hanly, MD;  Location: [facility];  Service: Orthopedics;  Laterality: Left;   ROTATOR CUFF REPAIR  2000   urterine suspension  1971   Social History   Tobacco Use   Smoking status: Never   Smokeless tobacco: Never  Vaping Use   Vaping Use: Never used  Substance Use Topics   Alcohol use: Not Currently    Comment: rare   Drug use: Never   Family History  Problem Relation Age of Onset   Early death Mother    Cancer Father    COPD Father    Lung cancer Father    Pancreatic cancer Sister    Alcohol abuse Brother    Heart disease Brother    Hypertension Brother     COPD Maternal Grandmother    Colon cancer Neg Hx    Esophageal cancer Neg Hx    Stomach cancer Neg Hx    Rectal cancer Neg Hx    Colon polyps Neg Hx    Allergies  Allergen Reactions   Ace Inhibitors Other (See Comments)    THROAT SWELLING   Requip [Ropinirole]     Vision change    Shrimp [Shellfish Allergy] Nausea And Vomiting   Aleve [Naproxen] Rash   Current Outpatient Medications on File Prior to Visit  Medication Sig Dispense Refill   bromocriptine (PARLODEL) 2.5 MG tablet TAKE 1 TABLET DAILY 90 tablet 3   Calcium Carbonate-Vit D-Min (CALCIUM 1200 PO) Take 2 capsules by mouth daily.     carvedilol (COREG) 12.5 MG tablet TAKE 1 TABLET TWICE A DAY WITH MEALS 180 tablet 1   chlorthalidone (HYGROTON) 25 MG tablet TAKE ONE-HALF (1/2) TABLET DAILY 45 tablet 1   Cholecalciferol (DIALYVITE VITAMIN D 5000) 125 MCG (5000 UT) capsule Take 5,000 Units by mouth 3 (three) times a week.     CRANBERRY-VITAMIN C-D MANNOSE PO Take 2 capsules by mouth daily.     fluticasone (FLONASE) 50 MCG/ACT nasal spray Place 2 sprays into both nostrils daily.     ibuprofen (ADVIL) 800 MG tablet Take 1 tablet (800 mg total) by mouth every 8 (eight) hours as needed. (Patient taking differently: Take 400 mg by mouth every 8 (eight) hours as needed.) 90 tablet 0   levocetirizine (XYZAL) 5 MG tablet Take 5 mg by mouth every evening.     Multiple Vitamin (MULTIVITAMIN) capsule Take 1 capsule by mouth daily.     Multiple Vitamins-Minerals (PRESERVISION AREDS 2 PO) Take 2 capsules by mouth daily.     OVER THE COUNTER MEDICATION Turmeric 1000mg  once daily     OVER THE COUNTER MEDICATION D-mannose- 1000 mg daily for bladder health     pantoprazole (PROTONIX) 40 MG tablet TAKE 1 TABLET DAILY 90 tablet 1   Polyethyl Glycol-Propyl Glycol (SYSTANE OP) Place 1 drop into both eyes daily as needed (dry eyes).     polyethylene glycol powder (GLYCOLAX/MIRALAX) 17 GM/SCOOP powder Take 17 g by mouth daily as needed for moderate  constipation.     rosuvastatin (CRESTOR) 10 MG tablet TAKE 1 TABLET AT BEDTIME 90 tablet 1   senna (SENOKOT) 8.6 MG TABS tablet Take 1-2 tablets by mouth at bedtime as needed for mild constipation.  No current facility-administered medications on file prior to visit.     Review of Systems  Constitutional:  Negative for activity change, appetite change, fatigue, fever and unexpected weight change.  HENT:  Negative for congestion, ear pain, rhinorrhea, sinus pressure and sore throat.   Eyes:  Negative for pain, redness and visual disturbance.  Respiratory:  Negative for cough, shortness of breath and wheezing.   Cardiovascular:  Negative for chest pain and palpitations.  Gastrointestinal:  Negative for abdominal pain, blood in stool, constipation and diarrhea.  Endocrine: Negative for polydipsia and polyuria.  Genitourinary:  Negative for dysuria, frequency and urgency.  Musculoskeletal:  Negative for arthralgias, back pain and myalgias.  Skin:  Negative for pallor and rash.  Allergic/Immunologic: Negative for environmental allergies.  Neurological:  Positive for dizziness. Negative for tremors, seizures, syncope, facial asymmetry, speech difficulty, weakness, light-headedness, numbness and headaches.       Dizziness is resolved   Hematological:  Negative for adenopathy. Does not bruise/bleed easily.  Psychiatric/Behavioral:  Negative for decreased concentration and dysphoric mood. The patient is not nervous/anxious.        Grief       Objective:   Physical Exam Constitutional:      General: She is not in acute distress.    Appearance: Normal appearance. She is well-developed. She is obese. She is not ill-appearing or diaphoretic.  HENT:     Head: Normocephalic and atraumatic.     Right Ear: External ear normal.     Left Ear: External ear normal.     Nose: Nose normal.     Mouth/Throat:     Pharynx: No oropharyngeal exudate.  Eyes:     General: No scleral icterus.        Right eye: No discharge.        Left eye: No discharge.     Conjunctiva/sclera: Conjunctivae normal.     Pupils: Pupils are equal, round, and reactive to light.     Comments: No nystagmus  Neck:     Thyroid: No thyromegaly.     Vascular: No carotid bruit or JVD.     Trachea: No tracheal deviation.  Cardiovascular:     Rate and Rhythm: Normal rate and regular rhythm.     Heart sounds: Normal heart sounds. No murmur heard.    No gallop.  Pulmonary:     Effort: Pulmonary effort is normal. No respiratory distress.     Breath sounds: Normal breath sounds. No wheezing or rales.  Abdominal:     General: Bowel sounds are normal. There is no distension or abdominal bruit.     Palpations: Abdomen is soft. There is no mass.     Tenderness: There is no abdominal tenderness.  Musculoskeletal:        General: No tenderness.     Cervical back: Full passive range of motion without pain, normal range of motion and neck supple.     Right lower leg: No edema.     Left lower leg: No edema.  Lymphadenopathy:     Cervical: No cervical adenopathy.  Skin:    General: Skin is warm and dry.     Coloration: Skin is not pale.     Findings: No rash.  Neurological:     Mental Status: She is alert and oriented to person, place, and time.     Cranial Nerves: No cranial nerve deficit, dysarthria or facial asymmetry.     Sensory: Sensation is intact. No sensory deficit.  Motor: No weakness, tremor, atrophy, abnormal muscle tone or pronator drift.     Coordination: Coordination is intact. Romberg sign negative. Coordination normal. Finger-Nose-Finger Test normal.     Gait: Gait normal.     Deep Tendon Reflexes: Reflexes are normal and symmetric. Reflexes normal.     Comments: No focal cerebellar signs   Psychiatric:        Mood and Affect: Mood normal.        Behavior: Behavior normal.        Thought Content: Thought content normal.     Comments: Pleasant and talkative Not tearful            Assessment & Plan:   Problem List Items Addressed This Visit       Cardiovascular and Mediastinum   Essential hypertension - Primary    Bp is not at goal BP: (!) 150/80   Wt gain noted Also stress of losing husband this fall  Disc lifestyle change as tolerated Enc strongly to minimize the 600 mg ibuprofen and try 400 mg only if abs necessary   Will add amlodipine 5 mg daily  Inst to call if side effects or problems  Continue Coreg 12.5 mg bid Chlorthalidone 12.5 mg daily (hesitant to inc this in light of prev dehydration but is option later) F/u in 2-3 wk      Relevant Medications   amLODipine (NORVASC) 5 MG tablet     Other   Dizziness    Pt had a brief episode of positional dizziness after moving head to the side at a church function  Felt like spinning Improved much after drinking fluids Entirely resolved Reassuring exam /no neuro changes Disc poss of peripheral vertigo linked to ear fullness  Will continue flonase Alert [place] if sympt return  ER precautions noted in detail Consider imaging or spec ref if this returns        Grief reaction    Lost husb unexpectedly in the fall  Overall doing well  Reviewed stressors/ coping techniques/symptoms/ support sources/ tx options and side effects in detail today Traveling /staying busy and social  Offered counseling if needed Expect sleep to improve with time

## 2022-11-07 ENCOUNTER — Other Ambulatory Visit: Payer: Self-pay | Admitting: Family Medicine

## 2022-11-17 ENCOUNTER — Telehealth: Payer: Self-pay | Admitting: Family Medicine

## 2022-11-17 DIAGNOSIS — R42 Dizziness and giddiness: Secondary | ICD-10-CM

## 2022-11-17 DIAGNOSIS — R5383 Other fatigue: Secondary | ICD-10-CM | POA: Insufficient documentation

## 2022-11-17 DIAGNOSIS — R6889 Other general symptoms and signs: Secondary | ICD-10-CM

## 2022-11-17 DIAGNOSIS — R5382 Chronic fatigue, unspecified: Secondary | ICD-10-CM

## 2022-11-17 DIAGNOSIS — E7800 Pure hypercholesterolemia, unspecified: Secondary | ICD-10-CM

## 2022-11-17 NOTE — Telephone Encounter (Signed)
Referral to cardiology cone    Note from her daughter over weekend: Hey there ---- I just go a call from my sister --- mom drove to [place] last weekend with her cousin.   I know she saw you for her "dizzy "spell ---- bu apparently she has had pain in her legs after walking short distance and numbness in R arm with extreme fatigue with almost no extertion---- I know my family is probably your worst nightmare but can you please refer her to cardiology and maybe get carotids and low ext [place] ------ mom really liked [place] when he took care of dad but [place] , [place] or [place] I would love as well.     Pt needs to follow up with me when able also  Thanks

## 2022-11-18 ENCOUNTER — Encounter: Payer: Self-pay | Admitting: Family Medicine

## 2022-11-18 ENCOUNTER — Ambulatory Visit: Payer: Medicare HMO | Admitting: Family Medicine

## 2022-11-18 ENCOUNTER — Telehealth: Payer: Self-pay | Admitting: Cardiovascular Disease

## 2022-11-18 VITALS — BP 128/76 | HR 75 | Temp 97.4°F | Ht 64.5 in | Wt 173.0 lb

## 2022-11-18 DIAGNOSIS — R7989 Other specified abnormal findings of blood chemistry: Secondary | ICD-10-CM

## 2022-11-18 DIAGNOSIS — E7800 Pure hypercholesterolemia, unspecified: Secondary | ICD-10-CM

## 2022-11-18 DIAGNOSIS — R7309 Other abnormal glucose: Secondary | ICD-10-CM

## 2022-11-18 DIAGNOSIS — R42 Dizziness and giddiness: Secondary | ICD-10-CM

## 2022-11-18 DIAGNOSIS — R6889 Other general symptoms and signs: Secondary | ICD-10-CM | POA: Diagnosis not present

## 2022-11-18 DIAGNOSIS — I10 Essential (primary) hypertension: Secondary | ICD-10-CM | POA: Diagnosis not present

## 2022-11-18 DIAGNOSIS — R202 Paresthesia of skin: Secondary | ICD-10-CM | POA: Insufficient documentation

## 2022-11-18 DIAGNOSIS — Z79899 Other long term (current) drug therapy: Secondary | ICD-10-CM

## 2022-11-18 DIAGNOSIS — R5382 Chronic fatigue, unspecified: Secondary | ICD-10-CM | POA: Diagnosis not present

## 2022-11-18 LAB — COMPREHENSIVE METABOLIC PANEL
ALT: 17 U/L (ref 0–35)
AST: 21 U/L (ref 0–37)
Albumin: 4.1 g/dL (ref 3.5–5.2)
Alkaline Phosphatase: 70 U/L (ref 39–117)
BUN: 21 mg/dL (ref 6–23)
CO2: 30 mEq/L (ref 19–32)
Calcium: 9.5 mg/dL (ref 8.4–10.5)
Chloride: 100 mEq/L (ref 96–112)
Creatinine, Ser: 0.82 mg/dL (ref 0.40–1.20)
GFR: 67.94 mL/min (ref >60.00)
Glucose, Bld: 94 mg/dL (ref 70–99)
Potassium: 3.4 mEq/L — ABNORMAL LOW (ref 3.5–5.1)
Sodium: 139 mEq/L (ref 135–145)
Total Bilirubin: 0.5 mg/dL (ref 0.2–1.2)
Total Protein: 6.7 g/dL (ref 6.0–8.3)

## 2022-11-18 LAB — HEMOGLOBIN A1C: Hgb A1c MFr Bld: 6.1 % (ref 4.6–6.5)

## 2022-11-18 LAB — CBC WITH DIFFERENTIAL/PLATELET
Basophils Absolute: 0 10*3/uL (ref 0.0–0.1)
Basophils Relative: 0.2 % (ref 0.0–3.0)
Eosinophils Absolute: 0.1 10*3/uL (ref 0.0–0.7)
Eosinophils Relative: 0.9 % (ref 0.0–5.0)
HCT: 41.4 % (ref 36.0–46.0)
Hemoglobin: 14.1 g/dL (ref 12.0–15.0)
Lymphocytes Relative: 26.6 % (ref 12.0–46.0)
Lymphs Abs: 1.6 10*3/uL (ref 0.7–4.0)
MCHC: 34.1 g/dL (ref 30.0–36.0)
MCV: 87.9 fl (ref 78.0–100.0)
Monocytes Absolute: 0.3 10*3/uL (ref 0.1–1.0)
Monocytes Relative: 5.6 % (ref 3.0–12.0)
Neutro Abs: 3.9 10*3/uL (ref 1.4–7.7)
Neutrophils Relative %: 66.7 % (ref 43.0–77.0)
Platelets: 261 10*3/uL (ref 150.0–400.0)
RBC: 4.71 Mil/uL (ref 3.87–5.11)
RDW: 13.7 % (ref 11.5–15.5)
WBC: 5.9 10*3/uL (ref 4.0–10.5)

## 2022-11-18 LAB — LIPID PANEL
Cholesterol: 147 mg/dL (ref 0–200)
HDL: 53.8 mg/dL (ref >39.00)
LDL Cholesterol: 67 mg/dL (ref 0–99)
NonHDL: 93.32
Total CHOL/HDL Ratio: 3
Triglycerides: 132 mg/dL (ref 0.0–149.0)
VLDL: 26.4 mg/dL (ref 0.0–40.0)

## 2022-11-18 LAB — VITAMIN D 25 HYDROXY (VIT D DEFICIENCY, FRACTURES): VITD: 38.35 ng/mL (ref 30.00–100.00)

## 2022-11-18 LAB — VITAMIN B12: Vitamin B-12: 634 pg/mL (ref 211–911)

## 2022-11-18 LAB — TSH: TSH: 1.36 u[IU]/mL (ref 0.35–5.50)

## 2022-11-18 NOTE — Assessment & Plan Note (Signed)
Noted on a trip Per pt- likely out of shape C/o some upper leg fatigue with walking (not calves) Normal pedal pulses today   Ref done to cardiology  ER precautions noted

## 2022-11-18 NOTE — Assessment & Plan Note (Signed)
Due for lipid panel Disc goals for lipids and reasons to control them Rev last labs with pt Rev low sat fat diet in detail  Plan to continue crestor 10 mg daily  Lab today

## 2022-11-18 NOTE — Assessment & Plan Note (Signed)
Bp is improved today/controlled BP: 128/76   Plan to continue Coreg 12.5 mg bid Chlorthalidone 12.5 mg daily  Lab today  Pulse rate is in normal rang

## 2022-11-18 NOTE — Assessment & Plan Note (Signed)
This comes and goes (none today) Mainly with driving  Some R shoulder pain  Symptoms reproduced with tinel test today and also abduction of shoulder   May be neurologic

## 2022-11-18 NOTE — Patient Instructions (Addendum)
I placed a cardiology referral for Dr Theodoro Parma can call Phone: [phone]- to inquire about an appointment (let them know there is a referral)    I want to consider MRI of your brain if we can get insurance to cover   The arm tinging may be from several things  I wonder if the shoulder plus some early carpal tunnel may add to it   You can get a wrist splint at a drug store for carpal tunnel - wear at night    Take care of yourself  Eat and drink regularly   If any symptoms suddenly worsen/ inform us and go to ER if severe   Labs today   Please keep [place] posted

## 2022-11-18 NOTE — Assessment & Plan Note (Signed)
Vit B12 and D added to labs

## 2022-11-18 NOTE — Assessment & Plan Note (Signed)
This occurred while traveling Got tired in upper legs when walking  Denies cp/sob however  Reassuring vitals and exam Labs ordered Reassuring EKG  Ref to cadiology

## 2022-11-18 NOTE — Telephone Encounter (Signed)
This is Dr. Myrene Buddy Standley-Chase's mother, she has an urgent referral to see Dr. Excell Seltzer. Is it OK to schedule her with Dr. Excell Seltzer as a NP for gen card?

## 2022-11-18 NOTE — Assessment & Plan Note (Signed)
This has not returned  Pt however feels "funny" in head -cannot explain  No focal neuro symptoms  ? 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

## 2022-11-18 NOTE — Assessment & Plan Note (Signed)
Level today  Consider MRI of head in light of past susp of pit adenoma

## 2022-11-18 NOTE — Telephone Encounter (Signed)
Called and spoke with patient. She has been added in this Friday to see Excell Seltzer at 11:00am

## 2022-11-18 NOTE — Telephone Encounter (Signed)
Yes this is fine with me. Dr Laury Axon reached out to me. Thank you

## 2022-11-18 NOTE — Telephone Encounter (Signed)
Was able to schedule pt for today at 12:30

## 2022-11-18 NOTE — Assessment & Plan Note (Signed)
A1c ordered Did not eat her usual on vacation recently  Is more tired

## 2022-11-18 NOTE — Progress Notes (Signed)
Subjective:    Patient ID: Nicole Suarez, female    DOB: Aug 09, 1940, 82 y.o.   MRN: [id number]  HPI Pt presents with fatigue/ exercise intolerance  Leg pain with walking  R arm numbness  Noted when traveling   Wt Readings from Last 3 Encounters:  11/18/22 173 lb (78.5 kg)  10/28/22 174 lb 8 oz (79.2 kg)  01/31/22 149 lb (67.6 kg)   29.24 kg/m  Vitals:   11/18/22 1224  BP: 128/76  Pulse: 75  Temp: (!) 97.4 F (36.3 C)  SpO2: 98%   Does not feel right overall  Went to [place] twice in a month= over did it   Head feels funny  No more dizziness however  Foggy feeling   Blames a lot on being severely busy   Has baseline pain in R shoulder RC sur years ago  Arm feels like pins and needles  Into her hand  Into thumb side of hand  Worse when driving  She just drove on a trip   No cp  No sob   EKG is stable today  When she walked her thighs got tired really fast  Used a cane when on uneven ground  No calf pain at all   Some runny nose from allergies- sneezing  Clear mucous  No fever- feels ok    Received a call from pt's daughter this weekend Worried about her symptoms Did stat cardiology ref to Dr Excell Seltzer as well   She has not had a call from them yet   Last myo perf and echo 2019      HTN bp is stable today  No cp or palpitations or headaches or edema  No side effects to medicines  BP Readings from Last 3 Encounters:  11/18/22 128/76  10/28/22 (!) 150/80  01/31/22 (!) 125/57    Coreg 12.5 mg bid Chlorthalidone 12.5 mg daily   Pulse Readings from Last 3 Encounters:  11/18/22 75  10/28/22 83  01/31/22 65     Hyperlipidemia Lab Results  Component Value Date   CHOL 136 05/18/2021   HDL 61.80 05/18/2021   LDLCALC 62 05/18/2021   TRIG 60.0 05/18/2021   CHOLHDL 2 05/18/2021   Takes crestor 10 mg daily   Has h/o RLS  Last metabolic panel Lab Results  Component Value Date   GLUCOSE 96 01/22/2022   NA 139 01/22/2022   K 4.5  01/22/2022   CL 102 01/22/2022   CO2 29 01/22/2022   BUN 25 (H) 01/22/2022   CREATININE 0.77 01/22/2022   GFRNONAA >60 01/22/2022   CALCIUM 9.9 01/22/2022   PROT 6.8 12/19/2021   ALBUMIN 4.2 12/19/2021   BILITOT 0.5 12/19/2021   ALKPHOS 67 12/19/2021   AST 23 12/19/2021   ALT 22 12/19/2021   ANIONGAP 8 01/22/2022   Lab Results  Component Value Date   WBC 6.6 01/22/2022   HGB 15.1 (H) 01/22/2022   HCT 45.6 01/22/2022   MCV 92.1 01/22/2022   PLT 257 01/22/2022   Lab Results  Component Value Date   TSH 1.44 05/18/2021   Lab Results  Component Value Date   HGBA1C 5.7 12/19/2021    She takes bromocriptine for elevated Prolactin 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   Patient Active Problem List   Diagnosis Date Noted   Arm paresthesia, right 11/18/2022   Fatigue 11/17/2022   Impaired exercise tolerance 11/17/2022   Dizziness 10/28/2022   Grief reaction 10/28/2022   Pelvic pain in female 12/19/2021   Current  use of proton pump inhibitor 05/24/2021   Low back pain 09/13/2020   Estrogen deficiency 04/26/2020   Elevated glucose level 04/26/2020   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   GERD (gastroesophageal reflux disease) 07/26/2019   Essential hypertension 07/26/2019   Hyperlipidemia 07/26/2019   Allergic rhinitis 07/26/2019   Urge incontinence 07/26/2019   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   RLS (restless legs syndrome) 07/26/2019   Colon polyp 07/26/2019   Osteopenia 07/26/2019   History of Mohs micrographic surgery for skin cancer 07/26/2019   Past Medical History:  Diagnosis Date   Arthritis    Cataract    bilateral lens implants   GERD (gastroesophageal reflux disease)    History of allergy    History of basal cell carcinoma (BCC) 2015   right temple Mohs   History of blood transfusion    History of SCC (squamous cell carcinoma) of skin 2017   right upper arm excised   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    History of urinary incontinence    History of UTI    Hyperlipidemia    Hypertension    Neuromuscular disorder (HCC)    restless legs    PONV (postoperative nausea and vomiting)    Past Surgical History:  Procedure Laterality Date   APPENDECTOMY  1963   CATARACT EXTRACTION, BILATERAL     08/2019, 09/2019   CESAREAN SECTION  1964   MICRODISCECTOMY LUMBAR     L3L4   RECTOCELE REPAIR  2009   REPLACEMENT TOTAL KNEE  2010   REVERSE SHOULDER ARTHROPLASTY Left 01/31/2022   Procedure: REVERSE SHOULDER ARTHROPLASTY;  Surgeon: Francena Hanly, MD;  Location: [facility];  Service: Orthopedics;  Laterality: Left;   ROTATOR CUFF REPAIR  2000   urterine suspension  1971   Social History   Tobacco Use   Smoking status: Never   Smokeless tobacco: Never  Vaping Use   Vaping Use: Never used  Substance Use Topics   Alcohol use: Not Currently    Comment: rare   Drug use: Never   Family History  Problem Relation Age of Onset   Early death Mother    Cancer Father    COPD Father    Lung cancer Father    Pancreatic cancer Sister    Alcohol abuse Brother    Heart disease Brother    Hypertension Brother    COPD Maternal Grandmother    Colon cancer Neg Hx    Esophageal cancer Neg Hx    Stomach cancer Neg Hx    Rectal cancer Neg Hx    Colon polyps Neg Hx    Allergies  Allergen Reactions   Ace Inhibitors Other (See Comments)    THROAT SWELLING   Requip [Ropinirole]     Vision change    Shrimp [Shellfish Allergy] Nausea And Vomiting   Aleve [Naproxen] Rash   Current Outpatient Medications on File Prior to Visit  Medication Sig Dispense Refill   amLODipine (NORVASC) 5 MG tablet Take 1 tablet (5 mg total) by mouth daily. 90 tablet 0   bromocriptine (PARLODEL) 2.5 MG tablet TAKE 1 TABLET DAILY 90 tablet 3   Calcium Carbonate-Vit D-Min (CALCIUM 1200 PO) Take 2 capsules by mouth daily.     carvedilol (COREG) 12.5 MG tablet TAKE 1 TABLET TWICE A DAY WITH MEALS 180  tablet 1   chlorthalidone (HYGROTON) 25 MG tablet TAKE ONE-HALF (1/2) TABLET DAILY 45 tablet 1   Cholecalciferol (DIALYVITE VITAMIN D 5000)  125 MCG (5000 UT) capsule Take 5,000 Units by mouth 3 (three) times a week.     CRANBERRY-VITAMIN C-D MANNOSE PO Take 2 capsules by mouth daily.     fluticasone (FLONASE) 50 MCG/ACT nasal spray Place 2 sprays into both nostrils daily.     ibuprofen (ADVIL) 800 MG tablet Take 1 tablet (800 mg total) by mouth every 8 (eight) hours as needed. (Patient taking differently: Take 400 mg by mouth every 8 (eight) hours as needed.) 90 tablet 0   levocetirizine (XYZAL) 5 MG tablet Take 5 mg by mouth every evening.     Multiple Vitamin (MULTIVITAMIN) capsule Take 1 capsule by mouth daily.     Multiple Vitamins-Minerals (PRESERVISION AREDS 2 PO) Take 2 capsules by mouth daily.     OVER THE COUNTER MEDICATION Turmeric 1000mg  once daily     OVER THE COUNTER MEDICATION D-mannose- 1000 mg daily for bladder health     pantoprazole (PROTONIX) 40 MG tablet TAKE 1 TABLET DAILY 90 tablet 1   Polyethyl Glycol-Propyl Glycol (SYSTANE OP) Place 1 drop into both eyes daily as needed (dry eyes).     polyethylene glycol powder (GLYCOLAX/MIRALAX) 17 GM/SCOOP powder Take 17 g by mouth daily as needed for moderate constipation.     rosuvastatin (CRESTOR) 10 MG tablet TAKE 1 TABLET AT BEDTIME 90 tablet 1   senna (SENOKOT) 8.6 MG TABS tablet Take 1-2 tablets by mouth at bedtime as needed for mild constipation.     No current facility-administered medications on file prior to visit.     Review of Systems  Constitutional:  Positive for fatigue. Negative for activity change, appetite change, fever and unexpected weight change.  HENT:  Positive for postnasal drip. Negative for congestion, ear pain, rhinorrhea, sinus pressure and sore throat.   Eyes:  Negative for pain, redness and visual disturbance.  Respiratory:  Negative for cough, shortness of breath, wheezing and stridor.    Cardiovascular:  Negative for chest pain, palpitations and leg swelling.       Poor exercise tolerance   Gastrointestinal:  Negative for abdominal pain, blood in stool, constipation and diarrhea.  Endocrine: Negative for polydipsia and polyuria.  Genitourinary:  Negative for decreased urine volume, dysuria, frequency, pelvic pain and urgency.  Musculoskeletal:  Negative for arthralgias, back pain and myalgias.       Upper legs are tired and sore with walking  Skin:  Negative for pallor and rash.  Allergic/Immunologic: Negative for environmental allergies.  Neurological:  Negative for dizziness, syncope and headaches.       Right arm paresthesias on/off  Hematological:  Negative for adenopathy. Does not bruise/bleed easily.  Psychiatric/Behavioral:  Negative for decreased concentration and dysphoric mood. The patient is not nervous/anxious.        Grief        Objective:   Physical Exam Constitutional:      General: She is not in acute distress.    Appearance: Normal appearance. She is well-developed. She is obese. She is not ill-appearing or diaphoretic.  HENT:     Head: Normocephalic and atraumatic.     Right Ear: Tympanic membrane and ear canal normal.     Left Ear: Tympanic membrane and ear canal normal.     Mouth/Throat:     Mouth: Mucous membranes are moist.  Eyes:     General: No scleral icterus.       Right eye: No discharge.        Left eye: No discharge.  Conjunctiva/sclera: Conjunctivae normal.     Pupils: Pupils are equal, round, and reactive to light.  Neck:     Thyroid: No thyromegaly.     Vascular: No carotid bruit or JVD.  Cardiovascular:     Rate and Rhythm: Normal rate and regular rhythm.     Heart sounds: Normal heart sounds.     No gallop.     Comments: Pedal pulses are full   Unable to palpate femoral pulses well due to habitus   Pulmonary:     Effort: Pulmonary effort is normal. No respiratory distress.     Breath sounds: Normal breath sounds.  No stridor. No wheezing, rhonchi or rales.  Chest:     Chest wall: No tenderness.  Abdominal:     General: There is no distension or abdominal bruit.     Palpations: Abdomen is soft. There is no mass.     Tenderness: There is abdominal tenderness. There is no right CVA tenderness, left CVA tenderness, guarding or rebound.  Musculoskeletal:     Cervical back: Normal range of motion and neck supple.     Right lower leg: No edema.     Left lower leg: No edema.     Comments: No leg swelling or tenderness   Lymphadenopathy:     Cervical: No cervical adenopathy.  Skin:    General: Skin is warm and dry.     Coloration: Skin is not pale.     Findings: No rash.  Neurological:     Mental Status: She is alert.     Cranial Nerves: No cranial nerve deficit, dysarthria or facial asymmetry.     Sensory: Sensation is intact. No sensory deficit.     Motor: No weakness, tremor, atrophy, abnormal muscle tone or pronator drift.     Coordination: Romberg sign negative. Coordination normal. Finger-Nose-Finger Test normal.     Gait: Gait is intact. Gait normal.     Deep Tendon Reflexes: Reflexes are normal and symmetric. Reflexes normal.     Comments: Positive tinel sign R wrist causing tingling hand to shoulder area   Psychiatric:        Mood and Affect: Mood normal.           Assessment & Plan:   Problem List Items Addressed This Visit       Cardiovascular and Mediastinum   Essential hypertension    Bp is improved today/controlled BP: 128/76   Plan to continue Coreg 12.5 mg bid Chlorthalidone 12.5 mg daily  Lab today  Pulse rate is in normal rang       Relevant Orders   EKG 12-Lead (Completed)   Comprehensive metabolic panel   CBC with Differential/Platelet   TSH   Lipid panel     Other   Arm paresthesia, right    This comes and goes (none today) Mainly with driving  Some R shoulder pain  Symptoms reproduced with tinel test today and also abduction of shoulder   May be  neurologic       Relevant Orders   EKG 12-Lead (Completed)   Vitamin B12   Current use of proton pump inhibitor    Vit B12 and D added to labs      Relevant Orders   Lipid panel   Vitamin B12   Dizziness    This has not returned  Pt however feels "funny" in head -cannot explain  No focal neuro symptoms  ? 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  On vision change  today   Lab today  Prolactin included  Consider imaging        Relevant Orders   Prolactin   Elevated glucose level    A1c ordered Did not eat her usual on vacation recently  Is more tired      Relevant Orders   Hemoglobin A1c   Fatigue    This occurred while traveling Got tired in upper legs when walking  Denies cp/sob however  Reassuring vitals and exam Labs ordered Reassuring EKG  Ref to cadiology        Relevant Orders   Comprehensive metabolic panel   CBC with Differential/Platelet   TSH   VITAMIN D 25 Hydroxy (Vit-D Deficiency, Fractures)   Prolactin   Vitamin B12   Hyperlipidemia    Due for lipid panel Disc goals for lipids and reasons to control them Rev last labs with pt Rev low sat fat diet in detail  Plan to continue crestor 10 mg daily  Lab today        Relevant Orders   EKG 12-Lead (Completed)   Comprehensive metabolic panel   Lipid panel   Impaired exercise tolerance - Primary    Noted on a trip Per pt- likely out of shape C/o some upper leg fatigue with walking (not calves) Normal pedal pulses today   Ref done to cardiology  ER precautions noted       Relevant Orders   EKG 12-Lead (Completed)   Non-artifactual high serum prolactin    Level today  Consider MRI of head in light of past susp of pit adenoma      Relevant Orders   Prolactin

## 2022-11-19 ENCOUNTER — Encounter: Payer: Self-pay | Admitting: Family Medicine

## 2022-11-19 LAB — PROLACTIN: Prolactin: 9.9 ng/mL

## 2022-11-20 NOTE — Addendum Note (Signed)
Addended by: Roxy Manns A on: 11/20/2022 08:37 PM   Modules accepted: Orders

## 2022-11-22 ENCOUNTER — Ambulatory Visit: Payer: Medicare HMO | Attending: Cardiovascular Disease | Admitting: Cardiovascular Disease

## 2022-11-22 ENCOUNTER — Encounter: Payer: Self-pay | Admitting: Cardiovascular Disease

## 2022-11-22 VITALS — BP 130/74 | HR 75 | Ht 64.5 in | Wt 174.2 lb

## 2022-11-22 DIAGNOSIS — R55 Syncope and collapse: Secondary | ICD-10-CM

## 2022-11-22 DIAGNOSIS — R0609 Other forms of dyspnea: Secondary | ICD-10-CM

## 2022-11-22 DIAGNOSIS — E782 Mixed hyperlipidemia: Secondary | ICD-10-CM

## 2022-11-22 DIAGNOSIS — I10 Essential (primary) hypertension: Secondary | ICD-10-CM | POA: Diagnosis not present

## 2022-11-22 NOTE — Patient Instructions (Signed)
Medication Instructions:  Your physician recommends that you continue on your current medications as directed. Please refer to the Current Medication list given to you today.  *If you need a refill on your cardiac medications before your next appointment, please call your pharmacy*   Lab Work: NONE If you have labs (blood work) drawn today and your tests are completely normal, you will receive your results only by: MyChart Message (if you have MyChart) OR A paper copy in the mail If you have any lab test that is abnormal or we need to change your treatment, we will call you to review the results.  Testing/Procedures: ECHO Your physician has requested that you have an echocardiogram. Echocardiography is a painless test that uses sound waves to create images of your heart. It provides your doctor with information about the size and shape of your heart and how well your heart's chambers and valves are working. This procedure takes approximately one hour. There are no restrictions for this procedure. Please do NOT wear cologne, perfume, aftershave, or lotions (deodorant is allowed). Please arrive 15 minutes prior to your appointment time.  Carotid Ultrasound Your physician has requested that you have a carotid duplex. This test is an ultrasound of the carotid arteries in your neck. It looks at blood flow through these arteries that supply the brain with blood. Allow one hour for this exam. There are no restrictions or special instructions.  Follow-Up: At [facility], you and your health needs are our priority.  As part of our continuing mission to provide you with exceptional heart care, we have created designated Provider Care Teams.  These Care Teams include your primary Cardiologist (physician) and Advanced Practice Providers (APPs -  Physician Assistants and Nurse Practitioners) who all work together to provide you with the care you need, when you need it.  Your next appointment:    1 year(s)  Provider:   Tonny Bollman, MD

## 2022-11-22 NOTE — Progress Notes (Signed)
Cardiology Office Note:    Date:  11/22/2022   ID:  Farrel Sisemore, DOB 01-21-1941, MRN [id number]  PCP:  Judy Pimple, MD   [facility] Providers Cardiologist:  None     Referring MD: Judy Pimple, MD   Chief Complaint  Patient presents with   Shortness of Breath    History of Present Illness:    Nicole Suarez is a 82 y.o. female presenting for evaluation of dizziness and fatigue/exercise intolerance.   Records are available from the cardiac imaging studies performed in 2019 at [facility].  An echocardiogram showed a normal LVEF of 55% and no valvular disease.  A nuclear stress test demonstrated a gated LVEF of 77% and no evidence of myocardial ischemia.  The patient is here alone today. She has three children. Myrene Buddy, who is a physician here locally, a son who is a physician in [place], and a daughter in [place]. She's recently did a lot of car traveling to [place] and [place]. Reports that she's been under a lot of stress. Her husband dies in Nov 17, 2023after 57 years of marriage. She traveled to visit family including family from the [place].  During that trip she noticed that she fatigues easily.  She did a lot of driving.  She also has developed some exercise intolerance and reports exertional dyspnea.  She has no chest pain or pressure.  She denies orthopnea, PND, leg swelling, or abdominal swelling.  She had a recent episode a few weeks ago when she was in church and developed dizziness and near syncope.  She states that she was sitting down and her head was turned to the left while she was in conversation.  She began to feel very dizzy and she leaned forward to rest until she felt better and the dizziness abated.  She has not had frank syncope.  She has had no heart palpitations.  She has had no other episodes.  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    History of urinary incontinence    History of UTI    Hyperlipidemia    Hypertension    Neuromuscular disorder (HCC)    restless legs    PONV (postoperative nausea and vomiting)     Past Surgical History:  Procedure Laterality Date   APPENDECTOMY  1963   CATARACT EXTRACTION, BILATERAL     08/2019, 09/2019   CESAREAN SECTION  1964   MICRODISCECTOMY LUMBAR     L3L4   RECTOCELE REPAIR  2009   REPLACEMENT TOTAL KNEE  2010   REVERSE SHOULDER ARTHROPLASTY Left 01/31/2022   Procedure: REVERSE SHOULDER ARTHROPLASTY;  Surgeon: Francena Hanly, MD;  Location: [facility];  Service: Orthopedics;  Laterality: Left;   ROTATOR CUFF REPAIR  2000   urterine suspension  1971    Current Medications: Current Meds  Medication Sig   amLODipine (NORVASC) 5 MG tablet Take 1 tablet (5 mg total) by mouth daily.   bromocriptine (PARLODEL) 2.5 MG tablet TAKE 1 TABLET DAILY   Calcium Carbonate-Vit D-Min (CALCIUM 1200 PO)  Take 2 capsules by mouth daily.   carvedilol (COREG) 12.5 MG tablet TAKE 1 TABLET TWICE A DAY WITH MEALS   chlorthalidone (HYGROTON) 25 MG tablet TAKE ONE-HALF (1/2) TABLET DAILY   Cholecalciferol (DIALYVITE VITAMIN D 5000) 125 MCG (5000 UT) capsule Take 5,000 Units by mouth 3 (three) times a week.   CRANBERRY-VITAMIN C-D MANNOSE PO Take 2 capsules by mouth daily.   fluticasone (FLONASE) 50 MCG/ACT nasal spray Place 2 sprays into both nostrils daily.   ibuprofen (ADVIL) 800 MG tablet Take 1 tablet (800 mg total) by mouth every 8 (eight) hours as needed. (Patient taking differently: Take 400 mg by mouth every 8 (eight) hours as needed.)   levocetirizine (XYZAL) 5 MG tablet Take 5 mg by mouth every evening.   Multiple  Vitamin (MULTIVITAMIN) capsule Take 1 capsule by mouth daily.   Multiple Vitamins-Minerals (PRESERVISION AREDS 2 PO) Take 2 capsules by mouth daily.   OVER THE COUNTER MEDICATION Turmeric 1000mg  once daily   OVER THE COUNTER MEDICATION D-mannose- 1000 mg daily for bladder health   pantoprazole (PROTONIX) 40 MG tablet TAKE 1 TABLET DAILY   Polyethyl Glycol-Propyl Glycol (SYSTANE OP) Place 1 drop into both eyes daily as needed (dry eyes).   polyethylene glycol powder (GLYCOLAX/MIRALAX) 17 GM/SCOOP powder Take 17 g by mouth daily as needed for moderate constipation.   rosuvastatin (CRESTOR) 10 MG tablet TAKE 1 TABLET AT BEDTIME   senna (SENOKOT) 8.6 MG TABS tablet Take 1-2 tablets by mouth at bedtime as needed for mild constipation.     Allergies:   Ace inhibitors, Requip [ropinirole], Shrimp [shellfish allergy], and Aleve [naproxen]   Social History   Socioeconomic History   Marital status: Widowed    Spouse name: Not on file   Number of children: Not on file   Years of education: Not on file   Highest education level: Not on file  Occupational History   Occupation: retired Charity fundraiser  Tobacco Use   Smoking status: Never   Smokeless tobacco: Never  Vaping Use   Vaping Use: Never used  Substance and Sexual Activity   Alcohol use: Not Currently    Comment: rare   Drug use: Never   Sexual activity: Not on file  Other Topics Concern   Not on file  Social History Narrative   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    Moved her from [place] in 2020   Cares for disabled husband   Lives in [place]    Social Determinants of Health   Financial Resource Strain: Low Risk  (11/21/2022)   Overall Financial Resource Strain (CARDIA)    Difficulty of Paying Living Expenses: Not very hard  Food Insecurity: No Food Insecurity (11/21/2022)   Hunger Vital Sign    Worried About Running Out of Food in the Last Year: Never true    Ran Out of Food in the Last Year: Never true  Transportation Needs: No  Transportation Needs (11/21/2022)   PRAPARE - Administrator, Civil Service (Medical): No    Lack of Transportation (Non-Medical): No  Physical Activity: Insufficiently Active (11/21/2022)   Exercise Vital Sign    Days of Exercise per Week: 2 days    Minutes of Exercise per Session: 10 min  Stress: No Stress Concern Present (11/21/2022)   [employer] of Occupational Health - Occupational Stress Questionnaire    Feeling of Stress : Only a little  Social Connections: Moderately Integrated (11/21/2022)   Social Connection and Isolation Panel [  NHANES]    Frequency of Communication with Friends and Family: More than three times a week    Frequency of Social Gatherings with Friends and Family: Twice a week    Attends Religious Services: More than 4 times per year    Active Member of [employer] or Organizations: Yes    Attends Banker Meetings: More than 4 times per year    Marital Status: Widowed     Family History: The patient's family history includes Alcohol abuse in her brother; COPD in her father and maternal grandmother; Cancer in her father; Early death in her mother; Heart disease in her brother; Hypertension in her brother; Lung cancer in her father; Pancreatic cancer in her sister. There is no history of Colon cancer, Esophageal cancer, Stomach cancer, Rectal cancer, or Colon polyps.  ROS:   Please see the history of present illness.    All other systems reviewed and are negative.  EKGs/Labs/Other Studies Reviewed:    The following studies were reviewed today: Cardiac Studies & Procedures     STRESS TESTS  MYOCARDIAL PERFUSION IMAGING 09/10/2019             EKG:  EKG is not ordered today.  The ekg ordered today demonstrates NSR with LAFB  Recent Labs: 11/18/2022: ALT 17; BUN 21; Creatinine, Ser 0.82; Hemoglobin 14.1; Platelets 261.0; Potassium 3.4; Sodium 139; TSH 1.36  Recent Lipid Panel    Component Value Date/Time   CHOL 147 11/18/2022 1314   TRIG  132.0 11/18/2022 1314   HDL 53.80 11/18/2022 1314   CHOLHDL 3 11/18/2022 1314   VLDL 26.4 11/18/2022 1314   LDLCALC 67 11/18/2022 1314     Risk Assessment/Calculations:             Physical Exam:    VS:  BP 130/74   Pulse 75   Ht 5' 4.5" (1.638 m)   Wt 174 lb 3.2 oz (79 kg)   SpO2 99%   BMI 29.44 kg/m     Wt Readings from Last 3 Encounters:  11/22/22 174 lb 3.2 oz (79 kg)  11/18/22 173 lb (78.5 kg)  10/28/22 174 lb 8 oz (79.2 kg)     GEN:  Well nourished, well developed pleasant elderly woman in no acute distress HEENT: Normal NECK: No JVD; No carotid bruits LYMPHATICS: No lymphadenopathy CARDIAC: RRR, no murmurs, rubs, gallops RESPIRATORY:  Clear to auscultation without rales, wheezing or rhonchi  ABDOMEN: Soft, non-tender, non-distended MUSCULOSKELETAL:  No edema; No deformity  SKIN: Warm and dry NEUROLOGIC:  Alert and oriented x 3 PSYCHIATRIC:  Normal affect   ASSESSMENT:    1. Exertional dyspnea   2. Near syncope   3. Essential hypertension   4. Mixed hyperlipidemia    PLAN:    In order of problems listed above:  The patient has a normal cardiopulmonary exam.  Her studies from 2019 are reviewed and showed no structural heart abnormality or evidence of ischemia at that time.  With her change in functional capacity, I think it is reasonable to reassess with an echocardiogram. Otherwise she is reassured about her cardiac status today Check carotid US. No bruit on exam, but symptoms concerning. Low suspicion for arrhythmia but if recurrent symptoms will check a ZIO monitor. BP very well-controlled. Continue amlodipine, chlorthalidone, and carvedilol.  Lipids at goal on rosuvastatin with LDL 67. Continue current Rx.   For follow-up I will plan to see her back in one year as long as her echo and carotid US  show no significant abnormalities.    Medication Adjustments/Labs and Tests Ordered: Current medicines are reviewed at length with the patient today.   Concerns regarding medicines are outlined above.  Orders Placed This Encounter  Procedures   ECHOCARDIOGRAM COMPLETE   VAS US CAROTID   No orders of the defined types were placed in this encounter.   Patient Instructions  Medication Instructions:  Your physician recommends that you continue on your current medications as directed. Please refer to the Current Medication list given to you today.  *If you need a refill on your cardiac medications before your next appointment, please call your pharmacy*   Lab Work: NONE If you have labs (blood work) drawn today and your tests are completely normal, you will receive your results only by: MyChart Message (if you have MyChart) OR A paper copy in the mail If you have any lab test that is abnormal or we need to change your treatment, we will call you to review the results.  Testing/Procedures: ECHO Your physician has requested that you have an echocardiogram. Echocardiography is a painless test that uses sound waves to create images of your heart. It provides your doctor with information about the size and shape of your heart and how well your heart's chambers and valves are working. This procedure takes approximately one hour. There are no restrictions for this procedure. Please do NOT wear cologne, perfume, aftershave, or lotions (deodorant is allowed). Please arrive 15 minutes prior to your appointment time.  Carotid Ultrasound Your physician has requested that you have a carotid duplex. This test is an ultrasound of the carotid arteries in your neck. It looks at blood flow through these arteries that supply the brain with blood. Allow one hour for this exam. There are no restrictions or special instructions.  Follow-Up: At [facility], you and your health needs are our priority.  As part of our continuing mission to provide you with exceptional heart care, we have created designated Provider Care Teams.  These Care Teams include  your primary Cardiologist (physician) and Advanced Practice Providers (APPs -  Physician Assistants and Nurse Practitioners) who all work together to provide you with the care you need, when you need it.  Your next appointment:   1 year(s)  Provider:   Tonny Bollman, MD     Signed, Tonny Bollman, MD  11/22/2022 11:48 AM    [facility]

## 2022-11-25 ENCOUNTER — Ambulatory Visit: Payer: Medicare HMO | Admitting: Family Medicine

## 2022-11-26 ENCOUNTER — Ambulatory Visit
Admission: RE | Admit: 2022-11-26 | Discharge: 2022-11-26 | Disposition: A | Discharge status: Home / Self Care | Payer: Medicare HMO | Source: Ambulatory Visit | Attending: Family Medicine | Admitting: Family Medicine

## 2022-11-26 DIAGNOSIS — R202 Paresthesia of skin: Secondary | ICD-10-CM

## 2022-11-26 DIAGNOSIS — R42 Dizziness and giddiness: Secondary | ICD-10-CM

## 2022-11-26 DIAGNOSIS — R7989 Other specified abnormal findings of blood chemistry: Secondary | ICD-10-CM

## 2022-11-26 MED ORDER — GADOPICLENOL 0.5 MMOL/ML IV SOLN
9.0000 mL | Freq: Once | INTRAVENOUS | Status: AC | PRN
  Administered 2022-11-26: 9 mL via INTRAVENOUS

## 2022-11-28 ENCOUNTER — Encounter: Payer: Self-pay | Admitting: Family Medicine

## 2022-12-02 ENCOUNTER — Other Ambulatory Visit: Payer: Self-pay | Admitting: Family Medicine

## 2022-12-05 ENCOUNTER — Encounter: Payer: Self-pay | Admitting: Family Medicine

## 2022-12-10 ENCOUNTER — Telehealth: Payer: Self-pay | Admitting: *Deleted

## 2022-12-10 ENCOUNTER — Encounter: Payer: Self-pay | Admitting: Family Medicine

## 2022-12-10 NOTE — Telephone Encounter (Signed)
Keep holding it  Keep watching your bp  If it is over 140 (systolic) or 90 diastolic routinely please follow up

## 2022-12-10 NOTE — Telephone Encounter (Signed)
Pt sent a message saying:  I have not taken Norvasc since last Wednesday night! I have been taking my BP every day! Yesterday it was132//74 pulse 73 on Left and. 134/67 pulse 69. Today it was 139/72 pulse78 left, and140/75 pulse76 right side. I feel better, more energy , no Headaches and no numbness in arms. What is next?

## 2022-12-11 NOTE — Telephone Encounter (Signed)
Sent mychart letting pt know Dr. Tower's comments. 

## 2022-12-12 ENCOUNTER — Other Ambulatory Visit: Payer: Self-pay | Admitting: Family Medicine

## 2022-12-19 ENCOUNTER — Ambulatory Visit (HOSPITAL_COMMUNITY)
Admission: RE | Admit: 2022-12-19 | Discharge: 2022-12-19 | Disposition: A | Discharge status: Home / Self Care | Payer: Medicare HMO | Source: Ambulatory Visit | Attending: Cardiology | Admitting: Cardiology

## 2022-12-19 ENCOUNTER — Ambulatory Visit (HOSPITAL_BASED_OUTPATIENT_CLINIC_OR_DEPARTMENT_OTHER): Payer: Medicare HMO

## 2022-12-19 DIAGNOSIS — E782 Mixed hyperlipidemia: Secondary | ICD-10-CM | POA: Insufficient documentation

## 2022-12-19 DIAGNOSIS — I10 Essential (primary) hypertension: Secondary | ICD-10-CM | POA: Diagnosis present

## 2022-12-19 DIAGNOSIS — R55 Syncope and collapse: Secondary | ICD-10-CM

## 2022-12-19 DIAGNOSIS — R0609 Other forms of dyspnea: Secondary | ICD-10-CM | POA: Diagnosis not present

## 2022-12-19 DIAGNOSIS — I082 Rheumatic disorders of both aortic and tricuspid valves: Secondary | ICD-10-CM | POA: Diagnosis not present

## 2022-12-19 DIAGNOSIS — I5030 Unspecified diastolic (congestive) heart failure: Secondary | ICD-10-CM | POA: Diagnosis not present

## 2022-12-19 LAB — ECHOCARDIOGRAM COMPLETE
AR max vel: 1.64 cm2
AV Area VTI: 1.83 cm2
AV Area mean vel: 1.62 cm2
AV Mean grad: 7.3 mmHg
AV Peak grad: 12.5 mmHg
Ao pk vel: 1.77 m/s
Area-P 1/2: 2.33 cm2
S' Lateral: 2.3 cm

## 2023-01-08 ENCOUNTER — Encounter: Payer: Self-pay | Admitting: Family Medicine

## 2023-01-08 NOTE — Telephone Encounter (Signed)
Thanks for letting me know  If she is symptomatic may need to see first available before then or UC (tomorrow is my half day and then unreachable fri/sat/sun and so sorry about that   If headache worsens needs attention asap  Those blood pressure are borderline but not acutely concerning  Please ask why she fell (trip or syncope?)  Thanks

## 2023-01-08 NOTE — Telephone Encounter (Signed)
Scheduled 7/1 for OV

## 2023-01-08 NOTE — Telephone Encounter (Signed)
FYI to PCP: appt scheduled 01/13/23

## 2023-01-08 NOTE — Telephone Encounter (Signed)
Pt needs to be evaluated by any available provider

## 2023-01-13 ENCOUNTER — Encounter: Payer: Self-pay | Admitting: *Deleted

## 2023-01-13 ENCOUNTER — Encounter: Payer: Self-pay | Admitting: Family Medicine

## 2023-01-13 ENCOUNTER — Ambulatory Visit (INDEPENDENT_AMBULATORY_CARE_PROVIDER_SITE_OTHER): Payer: Medicare HMO | Admitting: Family Medicine

## 2023-01-13 ENCOUNTER — Ambulatory Visit
Admission: RE | Admit: 2023-01-13 | Discharge: 2023-01-13 | Disposition: A | Discharge status: Home / Self Care | Payer: Medicare HMO | Source: Ambulatory Visit | Attending: Family Medicine | Admitting: Family Medicine

## 2023-01-13 VITALS — BP 135/78 | HR 72 | Temp 97.8°F | Ht 64.5 in | Wt 180.4 lb

## 2023-01-13 DIAGNOSIS — S060X0A Concussion without loss of consciousness, initial encounter: Secondary | ICD-10-CM

## 2023-01-13 DIAGNOSIS — M25511 Pain in right shoulder: Secondary | ICD-10-CM | POA: Diagnosis not present

## 2023-01-13 DIAGNOSIS — I10 Essential (primary) hypertension: Secondary | ICD-10-CM

## 2023-01-13 DIAGNOSIS — S060XAA Concussion with loss of consciousness status unknown, initial encounter: Secondary | ICD-10-CM | POA: Insufficient documentation

## 2023-01-13 DIAGNOSIS — S0990XA Unspecified injury of head, initial encounter: Secondary | ICD-10-CM

## 2023-01-13 DIAGNOSIS — W19XXXA Unspecified fall, initial encounter: Secondary | ICD-10-CM | POA: Insufficient documentation

## 2023-01-13 NOTE — Assessment & Plan Note (Addendum)
From fall on concrete 6/11 (out of golf cart)  Laceration and bruising is better  Still some headache and nausea   CT head ordered w/o contrast  Discussed importance of brain rest to heal  Will practice this over the coming weeks as well Handout given   ER / call back precautions discussed in detail/ see AVS

## 2023-01-13 NOTE — Patient Instructions (Signed)
Your blood pressure is acceptable today  Let's continue to watch it   I want to get a CT of your head for concussion  Also referral to Dr Rennis Chris for shoulder   If you don't get a call soon let us know    Practice brain rest in the meantime  No screens/ reading/ loud noise or bright lights   If any symptoms worsen let us know  If severe go to the ER
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
## 2023-01-13 NOTE — Assessment & Plan Note (Signed)
BP: 135/78  This is stable Sometimes labile at home (also recent concussion)  Will continue coreg 12.5 mg daily and chlorthalidone 12.5 mg daily  Last labs reviewed   Will continue to hold amlodipine for side effect of fatigue  Encouraged good health habits

## 2023-01-16 ENCOUNTER — Telehealth: Payer: Self-pay | Admitting: Family Medicine

## 2023-01-16 MED ORDER — NITROFURANTOIN MONOHYD MACRO 100 MG PO CAPS
100.0000 mg | ORAL_CAPSULE | Freq: Two times a day (BID) | ORAL | 0 refills | Status: DC
Start: 2023-01-16 — End: 2023-02-19

## 2023-01-16 NOTE — Telephone Encounter (Signed)
Patient not feeling well today with a  known history or UTIs causing similar symptoms. Patient with urinary frequency, malaise. Will call in Macrobid 100 mg po bid which has worked well for her in the past

## 2023-02-02 ENCOUNTER — Other Ambulatory Visit: Payer: Self-pay | Admitting: Family Medicine

## 2023-02-03 MED ORDER — FLUTICASONE PROPIONATE 50 MCG/ACT NA SUSP: 2.0000 | Freq: Every day | NASAL | 1 refills | Status: DC

## 2023-02-04 ENCOUNTER — Telehealth: Payer: Self-pay

## 2023-02-04 NOTE — Telephone Encounter (Signed)
Reached out to patient and scheduled her follow up visit.  Elijio Miles [facility]

## 2023-02-18 ENCOUNTER — Ambulatory Visit: Payer: Medicare HMO | Admitting: Family Medicine

## 2023-02-19 ENCOUNTER — Ambulatory Visit (INDEPENDENT_AMBULATORY_CARE_PROVIDER_SITE_OTHER): Payer: Medicare HMO | Admitting: Family Medicine

## 2023-02-19 ENCOUNTER — Encounter: Payer: Self-pay | Admitting: Family Medicine

## 2023-02-19 VITALS — BP 136/84 | HR 70 | Temp 97.8°F | Ht 64.5 in | Wt 178.2 lb

## 2023-02-19 DIAGNOSIS — R350 Frequency of micturition: Secondary | ICD-10-CM | POA: Diagnosis not present

## 2023-02-19 DIAGNOSIS — G4762 Sleep related leg cramps: Secondary | ICD-10-CM

## 2023-02-19 DIAGNOSIS — I10 Essential (primary) hypertension: Secondary | ICD-10-CM

## 2023-02-19 DIAGNOSIS — E876 Hypokalemia: Secondary | ICD-10-CM

## 2023-02-19 DIAGNOSIS — N3946 Mixed incontinence: Secondary | ICD-10-CM

## 2023-02-19 DIAGNOSIS — M25511 Pain in right shoulder: Secondary | ICD-10-CM

## 2023-02-19 LAB — BASIC METABOLIC PANEL
BUN: 22 mg/dL (ref 6–23)
CO2: 30 mEq/L (ref 19–32)
Calcium: 9.5 mg/dL (ref 8.4–10.5)
Chloride: 101 mEq/L (ref 96–112)
Creatinine, Ser: 0.75 mg/dL (ref 0.40–1.20)
GFR: 75.49 mL/min (ref >60.00)
Glucose, Bld: 91 mg/dL (ref 70–99)
Potassium: 3.6 mEq/L (ref 3.5–5.1)
Sodium: 138 mEq/L (ref 135–145)

## 2023-02-19 LAB — POC URINALSYSI DIPSTICK (AUTOMATED)
Bilirubin, UA: NEGATIVE
Blood, UA: NEGATIVE
Glucose, UA: NEGATIVE
Ketones, UA: NEGATIVE
Leukocytes, UA: NEGATIVE
Nitrite, UA: NEGATIVE
Protein, UA: NEGATIVE
Spec Grav, UA: 1.02 (ref 1.010–1.025)
Urobilinogen, UA: 0.2 E.U./dL
pH, UA: 6 (ref 5.0–8.0)

## 2023-02-19 MED ORDER — ESTRADIOL 0.1 MG/GM VA CREA: TOPICAL_CREAM | VAGINAL | 1 refills | Status: AC

## 2023-02-19 NOTE — Assessment & Plan Note (Signed)
Doing better with PT

## 2023-02-19 NOTE — Assessment & Plan Note (Signed)
Some help from prn magnesium Also suggest trial of mustard  Encouraged her to keep stretching -has helped   Bmet today

## 2023-02-19 NOTE — Assessment & Plan Note (Signed)
Has seen urology  Done kegels Mybetriq did not help   Does not want a nerve stimulator   Discussed trial of estrogen vag cream- pros/cons, wants to try  If not improvement in a month will stop it and update   ? If candidate for pelvic floor PT

## 2023-02-19 NOTE — Progress Notes (Signed)
Subjective:    Patient ID: Nicole Suarez, female    DOB: [birth year], 82 y.o.   MRN: [id number]  HPI  Wt Readings from Last 3 Encounters:  02/19/23 178 lb 4 oz (80.9 kg)  01/13/23 180 lb 6.4 oz (81.8 kg)  11/22/22 174 lb 3.2 oz (79 kg)   30.12 kg/m  Vitals:   02/19/23 1046  BP: 136/84  Pulse: 70  Temp: 97.8 F (36.6 C)  SpO2: 97%   Pt presents for follow up of HTN and chronic health issues Also urinary symptoms   Last seen in July after concussion  CT scan was normal   Has been traveling- [place] and [place] - for family  [place]- had a good time  Going to [place] in sept (for 82th bday) with family   Sister with ovarian cancer  Being treated    Some urgency / urge incontinence  Some stress incont  Kegel exercises did not help  Mybetriq did not help  Wearing pads  No burning or blood in urine  Has seen urology  Does not want to do a stimulator    Results for orders placed or performed in visit on 02/19/23  Basic metabolic panel  Result Value Ref Range   Sodium 138 135 - 145 mEq/L   Potassium 3.6 3.5 - 5.1 mEq/L   Chloride 101 96 - 112 mEq/L   CO2 30 19 - 32 mEq/L   Glucose, Bld 91 70 - 99 mg/dL   BUN 22 6 - 23 mg/dL   Creatinine, Ser 3.08 0.40 - 1.20 mg/dL   GFR 65.78 >46.96 mL/min   Calcium 9.5 8.4 - 10.5 mg/dL  POCT Urinalysis Dipstick (Automated)  Result Value Ref Range   Color, UA Yellow    Clarity, UA Clear    Glucose, UA Negative Negative   Bilirubin, UA Negative    Ketones, UA Negative    Spec Grav, UA 1.020 1.010 - 1.025   Blood, UA Negative    pH, UA 6.0 5.0 - 8.0   Protein, UA Negative Negative   Urobilinogen, UA 0.2 0.2 or 1.0 E.U./dL   Nitrite, UA Negative    Leukocytes, UA Negative Negative     HTN bp is stable today  No cp or palpitations or headaches or edema  No side effects to medicines  BP Readings from Last 3 Encounters:  02/19/23 136/84  01/13/23 135/78  11/22/22 130/74    Coreg 12.5 mg bid Chlorthalidone 12.5 mg daily    At home 134/64   Feels much better off amlodipine   Pulse Readings from Last 3 Encounters:  02/19/23 70  01/13/23 72  11/22/22 75   Has some leg cramps at night  Takes a little magnesium (watches out for diarrhea) Stretching at night  Tried mustard     Instructed to hold amlodipine for side effects of fatigue  Lab Results  Component Value Date   NA 138 02/19/2023   K 3.6 02/19/2023   CO2 30 02/19/2023   GLUCOSE 91 02/19/2023   BUN 22 02/19/2023   CREATININE 0.75 02/19/2023   CALCIUM 9.5 02/19/2023   GFR 75.49 02/19/2023   GFRNONAA >60 01/22/2022   Eating bananas since then    Lab Results  Component Value Date   CHOL 147 11/18/2022   HDL 53.80 11/18/2022   LDLCALC 67 11/18/2022   TRIG 132.0 11/18/2022   CHOLHDL 3 11/18/2022   Crestor 10 mg daily    Mammogram 2022 Self breast exam  Dexa  01/2021 -osteopenia     Patient Active Problem List   Diagnosis Date Noted   Nocturnal leg cramps 02/19/2023   Hypokalemia 02/19/2023   Mixed incontinence 02/19/2023   Right shoulder pain 01/13/2023   Arm paresthesia, right 11/18/2022   Impaired exercise tolerance 11/17/2022   Dizziness 10/28/2022   Grief reaction 10/28/2022   Current use of proton pump inhibitor 05/24/2021   Low back pain 09/13/2020   Estrogen deficiency 04/26/2020   Elevated glucose level 04/26/2020   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   GERD (gastroesophageal reflux disease) 07/26/2019   Essential hypertension 07/26/2019   Hyperlipidemia 07/26/2019   Allergic rhinitis 07/26/2019   Urge incontinence 07/26/2019   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   RLS (restless legs syndrome) 07/26/2019   Colon polyp 07/26/2019   Osteopenia 07/26/2019   History of Mohs micrographic surgery for skin cancer 07/26/2019   Past Medical History:  Diagnosis Date   Arthritis    Cataract    bilateral lens implants   GERD (gastroesophageal reflux disease)    History of  allergy    History of basal cell carcinoma (BCC) 2015   right temple Mohs   History of blood transfusion    History of SCC (squamous cell carcinoma) of skin 2017   right upper arm excised   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    History of urinary incontinence    History of UTI    Hyperlipidemia    Hypertension    Neuromuscular disorder (HCC)    restless legs    PONV (postoperative nausea and vomiting)    Past Surgical History:  Procedure Laterality Date   APPENDECTOMY  1963   CATARACT EXTRACTION, BILATERAL     08/2019, 09/2019   CESAREAN SECTION  1964   MICRODISCECTOMY LUMBAR     L3L4   RECTOCELE REPAIR  2009   REPLACEMENT TOTAL KNEE  2010   REVERSE SHOULDER ARTHROPLASTY Left 01/31/2022   Procedure: REVERSE SHOULDER ARTHROPLASTY;  Surgeon: Francena Hanly, MD;  Location: [facility];  Service: Orthopedics;  Laterality: Left;   ROTATOR CUFF REPAIR  2000   urterine suspension  1971   Social History   Tobacco Use   Smoking status: Never   Smokeless tobacco: Never  Vaping Use   Vaping status: Never Used  Substance Use Topics   Alcohol use: Not Currently    Comment: rare   Drug use: Never   Family History  Problem Relation Age of Onset   Early death Mother    Cancer Father    COPD Father    Lung cancer Father    Pancreatic cancer Sister    Alcohol abuse Brother    Heart disease Brother    Hypertension Brother    COPD Maternal Grandmother    Colon cancer Neg Hx    Esophageal cancer Neg Hx    Stomach cancer Neg Hx    Rectal cancer Neg Hx    Colon polyps Neg Hx    Allergies  Allergen Reactions   Ace Inhibitors Other (See Comments)    THROAT SWELLING   Amlodipine     Fatigue    Requip [Ropinirole]     Vision change    Shrimp [Shellfish Allergy] Nausea And Vomiting   Aleve [Naproxen] Rash   Current Outpatient Medications on File Prior to Visit  Medication Sig Dispense Refill   bromocriptine (PARLODEL) 2.5 MG tablet TAKE 1  TABLET DAILY 90 tablet 3   Calcium Carbonate-Vit D-Min (CALCIUM  1200 PO) Take 2 capsules by mouth daily.     carvedilol (COREG) 12.5 MG tablet TAKE 1 TABLET TWICE A DAY WITH MEALS 180 tablet 1   chlorthalidone (HYGROTON) 25 MG tablet TAKE ONE-HALF (1/2) TABLET DAILY 45 tablet 1   cholecalciferol (VITAMIN D3) 25 MCG (1000 UNIT) tablet Take 1,000 Units by mouth daily.     CRANBERRY-VITAMIN C-D MANNOSE PO Take 2 capsules by mouth daily.     fluticasone (FLONASE) 50 MCG/ACT nasal spray Place 2 sprays into both nostrils daily. 48 g 1   ibuprofen (ADVIL) 400 MG tablet Take 400 mg by mouth every 8 (eight) hours as needed.     levocetirizine (XYZAL) 5 MG tablet Take 5 mg by mouth every evening.     Multiple Vitamin (MULTIVITAMIN) capsule Take 1 capsule by mouth daily.     Multiple Vitamins-Minerals (PRESERVISION AREDS 2 PO) Take 2 capsules by mouth daily.     OVER THE COUNTER MEDICATION Turmeric 1000mg  once daily     OVER THE COUNTER MEDICATION D-mannose- 1000 mg daily for bladder health     pantoprazole (PROTONIX) 40 MG tablet TAKE 1 TABLET DAILY 90 tablet 1   Polyethyl Glycol-Propyl Glycol (SYSTANE OP) Place 1 drop into both eyes daily as needed (dry eyes).     polyethylene glycol powder (GLYCOLAX/MIRALAX) 17 GM/SCOOP powder Take 17 g by mouth daily as needed for moderate constipation.     rosuvastatin (CRESTOR) 10 MG tablet TAKE 1 TABLET AT BEDTIME 90 tablet 1   senna (SENOKOT) 8.6 MG TABS tablet Take 1-2 tablets by mouth at bedtime as needed for mild constipation.     No current facility-administered medications on file prior to visit.    Review of Systems  Constitutional:  Negative for activity change, appetite change, fatigue, fever and unexpected weight change.  HENT:  Negative for congestion, ear pain, rhinorrhea, sinus pressure and sore throat.   Eyes:  Negative for pain, redness and visual disturbance.  Respiratory:  Negative for cough, shortness of breath and wheezing.   Cardiovascular:   Negative for chest pain and palpitations.  Gastrointestinal:  Negative for abdominal pain, blood in stool, constipation and diarrhea.  Endocrine: Negative for polydipsia and polyuria.  Genitourinary:  Negative for dysuria, frequency and urgency.       Mixed urinary incontinence   Musculoskeletal:  Negative for arthralgias, back pain and myalgias.  Skin:  Negative for pallor and rash.  Allergic/Immunologic: Negative for environmental allergies.  Neurological:  Negative for dizziness, syncope and headaches.  Hematological:  Negative for adenopathy. Does not bruise/bleed easily.  Psychiatric/Behavioral:  Negative for decreased concentration and dysphoric mood. The patient is not nervous/anxious.        Objective:   Physical Exam Constitutional:      General: She is not in acute distress.    Appearance: Normal appearance. She is well-developed. She is obese. She is not ill-appearing or diaphoretic.  HENT:     Head: Normocephalic and atraumatic.     Mouth/Throat:     Mouth: Mucous membranes are moist.  Eyes:     General: No scleral icterus.    Conjunctiva/sclera: Conjunctivae normal.     Pupils: Pupils are equal, round, and reactive to light.  Neck:     Thyroid: No thyromegaly.     Vascular: No carotid bruit or JVD.  Cardiovascular:     Rate and Rhythm: Normal rate and regular rhythm.     Pulses: Normal pulses.     Heart sounds: Normal heart sounds.  No gallop.  Pulmonary:     Effort: Pulmonary effort is normal. No respiratory distress.     Breath sounds: Normal breath sounds. No wheezing or rales.  Abdominal:     General: There is no distension or abdominal bruit.     Palpations: Abdomen is soft.  Musculoskeletal:     Cervical back: Normal range of motion and neck supple.     Right lower leg: No edema.     Left lower leg: No edema.     Comments: Limited abduction of both shoulders but rom is improved  Lymphadenopathy:     Cervical: No cervical adenopathy.  Skin:     General: Skin is warm and dry.     Coloration: Skin is not pale.     Findings: No rash.  Neurological:     Mental Status: She is alert.     Cranial Nerves: No cranial nerve deficit.     Coordination: Coordination normal.     Deep Tendon Reflexes: Reflexes are normal and symmetric. Reflexes normal.  Psychiatric:        Mood and Affect: Mood normal.           Assessment & Plan:   Problem List Items Addressed This Visit       Cardiovascular and Mediastinum   Essential hypertension - Primary    bp in fair control at this time  BP Readings from Last 1 Encounters:  02/19/23 136/84   No changes needed Most recent labs reviewed  Disc lifstyle change with low sodium diet and exercise  Doing fine w/o amlodipine  Continues Coreg 12.5 mg bid Chlorthalidone 12.5 mg daily  Lab today/ last check mild low K      Relevant Orders   Basic metabolic panel (Completed)     Other   Right shoulder pain    Doing better with PT       Nocturnal leg cramps    Some help from prn magnesium Also suggest trial of mustard  Encouraged her to keep stretching -has helped   Bmet today       Mixed incontinence    Has seen urology  Done kegels Mybetriq did not help   Does not want a nerve stimulator   Discussed trial of estrogen vag cream- pros/cons, wants to try  If not improvement in a month will stop it and update   ? If candidate for pelvic floor PT       Hypokalemia    Lab Results  Component Value Date   K 3.4 (L) 11/18/2022   More leg cramps at night  On chlorthalidone Eating  bananas   Will re check bmet today  Consider treatment if needed      Relevant Orders   Basic metabolic panel (Completed)   Other Visit Diagnoses     Urinary frequency       Relevant Orders   POCT Urinalysis Dipstick (Automated) (Completed)

## 2023-02-19 NOTE — Assessment & Plan Note (Signed)
bp in fair control at this time  BP Readings from Last 1 Encounters:  02/19/23 136/84   No changes needed Most recent labs reviewed  Disc lifstyle change with low sodium diet and exercise  Doing fine w/o amlodipine  Continues Coreg 12.5 mg bid Chlorthalidone 12.5 mg daily  Lab today/ last check mild low K

## 2023-02-19 NOTE — Patient Instructions (Addendum)
Try a pea sized amount of estrace cream twice weekly to help with urinary incontinence  If not helpful in a month you can stop it   Let's re check your potassium today    Take care of yourself !

## 2023-02-19 NOTE — Assessment & Plan Note (Signed)
Lab Results  Component Value Date   K 3.4 (L) 11/18/2022   More leg cramps at night  On chlorthalidone Eating  bananas   Will re check bmet today  Consider treatment if needed

## 2023-03-05 ENCOUNTER — Encounter: Payer: Self-pay | Admitting: Family Medicine

## 2023-03-05 MED ORDER — CEPHALEXIN 500 MG PO CAPS: 500.0000 mg | ORAL_CAPSULE | Freq: Two times a day (BID) | ORAL | 0 refills | Status: DC

## 2023-03-24 ENCOUNTER — Encounter: Payer: Self-pay | Admitting: Family Medicine

## 2023-03-24 NOTE — Telephone Encounter (Signed)
Sent mychart but schedulers please schedule pt for ?UTI appt with anyone available

## 2023-03-24 NOTE — Telephone Encounter (Signed)
Needs a visit with first available please

## 2023-03-25 ENCOUNTER — Telehealth: Payer: Self-pay | Admitting: Family Medicine

## 2023-03-25 ENCOUNTER — Other Ambulatory Visit (INDEPENDENT_AMBULATORY_CARE_PROVIDER_SITE_OTHER): Payer: Medicare HMO

## 2023-03-25 DIAGNOSIS — R350 Frequency of micturition: Secondary | ICD-10-CM | POA: Diagnosis not present

## 2023-03-25 DIAGNOSIS — R8290 Unspecified abnormal findings in urine: Secondary | ICD-10-CM

## 2023-03-25 LAB — POC URINALSYSI DIPSTICK (AUTOMATED)
Bilirubin, UA: NEGATIVE
Blood, UA: 50 — AB
Glucose, UA: NEGATIVE
Ketones, UA: NEGATIVE
Nitrite, UA: POSITIVE
Protein, UA: NEGATIVE
Spec Grav, UA: 1.015 (ref 1.010–1.025)
Urobilinogen, UA: 0.2 U/dL
pH, UA: 7 (ref 5.0–8.0)

## 2023-03-25 MED ORDER — CEPHALEXIN 500 MG PO CAPS: 500.0000 mg | ORAL_CAPSULE | Freq: Two times a day (BID) | ORAL | 0 refills | Status: DC

## 2023-03-25 NOTE — Telephone Encounter (Signed)
Urinalysis -likely uti  Sent for a culture (thank you)  I sent keflex to pharmacy  Start that  See you tomorrow  Please give ER precautions

## 2023-03-25 NOTE — Telephone Encounter (Signed)
Pt didn't answer so per DPR left VM letting pt know Dr. Royden Purl comments

## 2023-03-25 NOTE — Telephone Encounter (Signed)
I received a text from Hansel Starling  She needs to drop of a urine sample  Please call her to do that   Keep appointment with me tomorrow also

## 2023-03-25 NOTE — Telephone Encounter (Signed)
Pt notified and will bring urine sample today

## 2023-03-26 ENCOUNTER — Encounter: Payer: Self-pay | Admitting: Family Medicine

## 2023-03-26 ENCOUNTER — Ambulatory Visit (INDEPENDENT_AMBULATORY_CARE_PROVIDER_SITE_OTHER): Payer: Medicare HMO | Admitting: Family Medicine

## 2023-03-26 VITALS — BP 134/74 | HR 68 | Temp 97.9°F | Resp 16 | Ht 64.5 in | Wt 183.1 lb

## 2023-03-26 DIAGNOSIS — N3000 Acute cystitis without hematuria: Secondary | ICD-10-CM | POA: Diagnosis not present

## 2023-03-26 NOTE — Patient Instructions (Addendum)
Continue the keflex   Drink lots of water  If symptoms suddenly worsen or fever -call us   We will contact your when the culture returns   I want to keep track of when you get infections and what bacteria you have

## 2023-03-26 NOTE — Assessment & Plan Note (Addendum)
In the setting (per pt) of frequent utis -has seen urology in past  Today voiding symptoms but no fever or blood in urine  No new flank pain  Exam is reassuring  Reviewed urinalysis from yesterday-culture is in process Taking keflex  Instructed to call if symptoms worsen before result  Leaving sat for [place] then out of country   Call back and Er precautions noted in detail today    Of note no record of recent uti here but per pt has had 4 (seen in [facility] and once in [place]) Low threshold to return to urology for this if it continues to occur   Reviewed last urology note from 2023 -mentioned urinary retention and incontinence issues   (at that time trying to avoid therapies that cause urinary retention)

## 2023-03-26 NOTE — Progress Notes (Signed)
Subjective:    Patient ID: Nicole Suarez, female    DOB: 1941/03/21, 82 y.o.   MRN: [id number]  HPI  Wt Readings from Last 3 Encounters:  03/26/23 183 lb 2 oz (83.1 kg)  02/19/23 178 lb 4 oz (80.9 kg)  01/13/23 180 lb 6.4 oz (81.8 kg)   30.95 kg/m  Vitals:   03/26/23 1053  BP: 134/74  Pulse: 68  Resp: 16  Temp: 97.9 F (36.6 C)  SpO2: 98%    Pt presents with c/o urinary symptoms   Is leaving Friday for [place]   Symptoms started a week ago  Frequency and urgency  No blood in urine  Bladder was sensitive and uncomfortable  No dysuria  Azo helped temporarily   Symptoms returned And got worse  No nausea  A little flank pain - that may be baseline  No fever  No confusion  Some fatigue     Saw Dr Ashley Royalty for incontinence in past Wanted to do a stimulator    Left urinalysis yesterday Lab Results  Component Value Date   COLORU Yellow 03/25/2023   CLARITYU Hazy 03/25/2023   GLUCOSEUR Negative 03/25/2023   BILIRUBINUR Negative 03/25/2023   KETONESU Negative 03/25/2023   SPECGRAV 1.015 03/25/2023   RBCUR 50 Ery/uL (A) 03/25/2023   PHUR 7.0 03/25/2023   PROTEINUR Negative 03/25/2023   UROBILINOGEN 0.2 03/25/2023   LEUKOCYTESUR Trace (A) 03/25/2023   Culture is pending  Started keflex   3 utis in last year  One in [place]         Patient Active Problem List   Diagnosis Date Noted   Nocturnal leg cramps 02/19/2023   Hypokalemia 02/19/2023   Mixed incontinence 02/19/2023   Right shoulder pain 01/13/2023   Arm paresthesia, right 11/18/2022   Impaired exercise tolerance 11/17/2022   Dizziness 10/28/2022   Grief reaction 10/28/2022   Current use of proton pump inhibitor 05/24/2021   Low back pain 09/13/2020   Estrogen deficiency 04/26/2020   Elevated glucose level 04/26/2020   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   Acute cystitis 11/02/2019   GERD (gastroesophageal reflux disease) 07/26/2019   Essential hypertension  07/26/2019   Hyperlipidemia 07/26/2019   Allergic rhinitis 07/26/2019   Urge incontinence 07/26/2019   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   RLS (restless legs syndrome) 07/26/2019   Colon polyp 07/26/2019   Osteopenia 07/26/2019   History of Mohs micrographic surgery for skin cancer 07/26/2019   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    History of urinary incontinence    History of UTI    Hyperlipidemia    Hypertension    Neuromuscular disorder (HCC)    restless legs    PONV (postoperative nausea and vomiting)    Past Surgical History:  Procedure Laterality Date   APPENDECTOMY  1963   CATARACT EXTRACTION, BILATERAL     08/2019, 09/2019   CESAREAN SECTION  1964   MICRODISCECTOMY LUMBAR     L3L4   RECTOCELE REPAIR  2009   REPLACEMENT TOTAL KNEE  2010   REVERSE  SHOULDER ARTHROPLASTY Left 01/31/2022   Procedure: REVERSE SHOULDER ARTHROPLASTY;  Surgeon: Francena Hanly, MD;  Location: [facility];  Service: Orthopedics;  Laterality: Left;   ROTATOR CUFF REPAIR  2000   urterine suspension  1971   Social History   Tobacco Use   Smoking status: Never   Smokeless tobacco: Never  Vaping Use   Vaping status: Never Used  Substance Use Topics   Alcohol use: Not Currently    Comment: rare   Drug use: Never   Family History  Problem Relation Age of Onset   Early death Mother    Cancer Father    COPD Father    Lung cancer Father    Pancreatic cancer Sister    Alcohol abuse Brother    Heart disease Brother    Hypertension Brother    COPD Maternal Grandmother    Colon cancer Neg Hx    Esophageal  cancer Neg Hx    Stomach cancer Neg Hx    Rectal cancer Neg Hx    Colon polyps Neg Hx    Allergies  Allergen Reactions   Ace Inhibitors Other (See Comments)    THROAT SWELLING   Amlodipine     Fatigue    Requip [Ropinirole]     Vision change    Shrimp [Shellfish Allergy] Nausea And Vomiting   Aleve [Naproxen] Rash   Current Outpatient Medications on File Prior to Visit  Medication Sig Dispense Refill   bromocriptine (PARLODEL) 2.5 MG tablet TAKE 1 TABLET DAILY 90 tablet 3   Calcium Carbonate-Vit D-Min (CALCIUM 1200 PO) Take 2 capsules by mouth daily.     carvedilol (COREG) 12.5 MG tablet TAKE 1 TABLET TWICE A DAY WITH MEALS 180 tablet 1   cephALEXin (KEFLEX) 500 MG capsule Take 1 capsule (500 mg total) by mouth 2 (two) times daily. 10 capsule 0   cephALEXin (KEFLEX) 500 MG capsule Take 1 capsule (500 mg total) by mouth 2 (two) times daily. 14 capsule 0   chlorthalidone (HYGROTON) 25 MG tablet TAKE ONE-HALF (1/2) TABLET DAILY 45 tablet 1   cholecalciferol (VITAMIN D3) 25 MCG (1000 UNIT) tablet Take 1,000 Units by mouth daily.     CRANBERRY-VITAMIN C-D MANNOSE PO Take 2 capsules by mouth daily.     estradiol (ESTRACE VAGINAL) 0.1 MG/GM vaginal cream Pea sized amount to urethra/vulvar area twice weekly 42.5 g 1   fluticasone (FLONASE) 50 MCG/ACT nasal spray Place 2 sprays into both nostrils daily. 48 g 1   ibuprofen (ADVIL) 400 MG tablet Take 400 mg by mouth every 8 (eight) hours as needed.     levocetirizine (XYZAL) 5 MG tablet Take 5 mg by mouth every evening.     Multiple Vitamin (MULTIVITAMIN) capsule Take 1 capsule by mouth daily.     Multiple Vitamins-Minerals (PRESERVISION AREDS 2 PO) Take 2 capsules by mouth daily.     OVER THE COUNTER MEDICATION Turmeric 1000mg  once daily     OVER THE COUNTER MEDICATION D-mannose- 1000 mg daily for bladder health     pantoprazole (PROTONIX) 40 MG tablet TAKE 1 TABLET DAILY 90 tablet 1   Polyethyl Glycol-Propyl Glycol (SYSTANE OP) Place 1 drop  into both eyes daily as needed (dry eyes).     polyethylene glycol powder (GLYCOLAX/MIRALAX) 17 GM/SCOOP powder Take 17 g by mouth daily as needed for moderate constipation.     rosuvastatin (CRESTOR) 10 MG tablet TAKE 1 TABLET AT BEDTIME 90 tablet 1   senna (SENOKOT) 8.6 MG TABS tablet Take 1-2  tablets by mouth at bedtime as needed for mild constipation.     No current facility-administered medications on file prior to visit.    Review of Systems  Constitutional:  Positive for fatigue. Negative for activity change, appetite change and fever.  HENT:  Negative for congestion and sore throat.   Eyes:  Negative for itching and visual disturbance.  Respiratory:  Negative for cough and shortness of breath.   Cardiovascular:  Negative for leg swelling.  Gastrointestinal:  Negative for abdominal distention, abdominal pain, constipation, diarrhea and nausea.  Endocrine: Negative for cold intolerance and polydipsia.  Genitourinary:  Positive for frequency and urgency. Negative for difficulty urinating, dysuria, flank pain and hematuria.       Some trouble emptying   Musculoskeletal:  Negative for myalgias.  Skin:  Negative for rash.  Allergic/Immunologic: Negative for immunocompromised state.  Neurological:  Negative for dizziness and weakness.  Hematological:  Negative for adenopathy.       Objective:   Physical Exam Constitutional:      General: She is not in acute distress.    Appearance: Normal appearance. She is well-developed. She is obese. She is not ill-appearing or diaphoretic.  HENT:     Head: Normocephalic and atraumatic.  Eyes:     Conjunctiva/sclera: Conjunctivae normal.     Pupils: Pupils are equal, round, and reactive to light.  Neck:     Thyroid: No thyromegaly.     Vascular: No carotid bruit or JVD.  Cardiovascular:     Rate and Rhythm: Normal rate and regular rhythm.     Heart sounds: Normal heart sounds.     No gallop.  Pulmonary:     Effort: Pulmonary effort is  normal. No respiratory distress.     Breath sounds: Normal breath sounds. No wheezing or rales.  Abdominal:     General: Abdomen is protuberant. There is no distension or abdominal bruit.     Palpations: Abdomen is soft. There is no hepatomegaly, splenomegaly or pulsatile mass.     Tenderness: There is no right CVA tenderness or left CVA tenderness.     Comments: Slight suprapubic tenderness -only to deep palpation (sensation of needing to urinate)  No obvious bladder distension   No new cva tenderness  Musculoskeletal:     Cervical back: Normal range of motion and neck supple.     Right lower leg: No edema.     Left lower leg: No edema.  Lymphadenopathy:     Cervical: No cervical adenopathy.  Skin:    General: Skin is warm and dry.     Coloration: Skin is not pale.     Findings: No rash.  Neurological:     Mental Status: She is alert.     Coordination: Coordination normal.     Deep Tendon Reflexes: Reflexes are normal and symmetric. Reflexes normal.  Psychiatric:        Mood and Affect: Mood normal.           Assessment & Plan:   Problem List Items Addressed This Visit       Genitourinary   Acute cystitis - Primary    In the setting (per pt) of frequent utis -has seen urology in past  Today voiding symptoms but no fever or blood in urine  No new flank pain  Exam is reassuring  Reviewed urinalysis from yesterday-culture is in process Taking keflex  Instructed to call if symptoms worsen before result  Leaving sat for [place] then out of country  Call back and Er precautions noted in detail today    Of note no record of recent uti here but per pt has had 4 (seen in [facility] and once in [place]) Low threshold to return to urology for this if it continues to occur   Reviewed last urology note from 2023 -mentioned urinary retention and incontinence issues   (at that time trying to avoid therapies that cause urinary retention)

## 2023-03-28 LAB — URINE CULTURE
MICRO NUMBER:: 15445966
SPECIMEN QUALITY:: ADEQUATE

## 2023-04-20 IMAGING — MG MM DIGITAL SCREENING BILAT W/ TOMO AND CAD
6 of 10 series · 6 of 30 positions shown · non-contrast
Comparison: Previous exam(s).

CLINICAL DATA: Screening.

EXAM:
DIGITAL SCREENING BILATERAL MAMMOGRAM WITH TOMOSYNTHESIS AND CAD
TECHNIQUE: Bilateral screening digital craniocaudal and mediolateral oblique
mammograms were obtained. Bilateral screening digital breast
tomosynthesis was performed. The images were evaluated with
computer-aided detection.

[R MLO synth-2D]
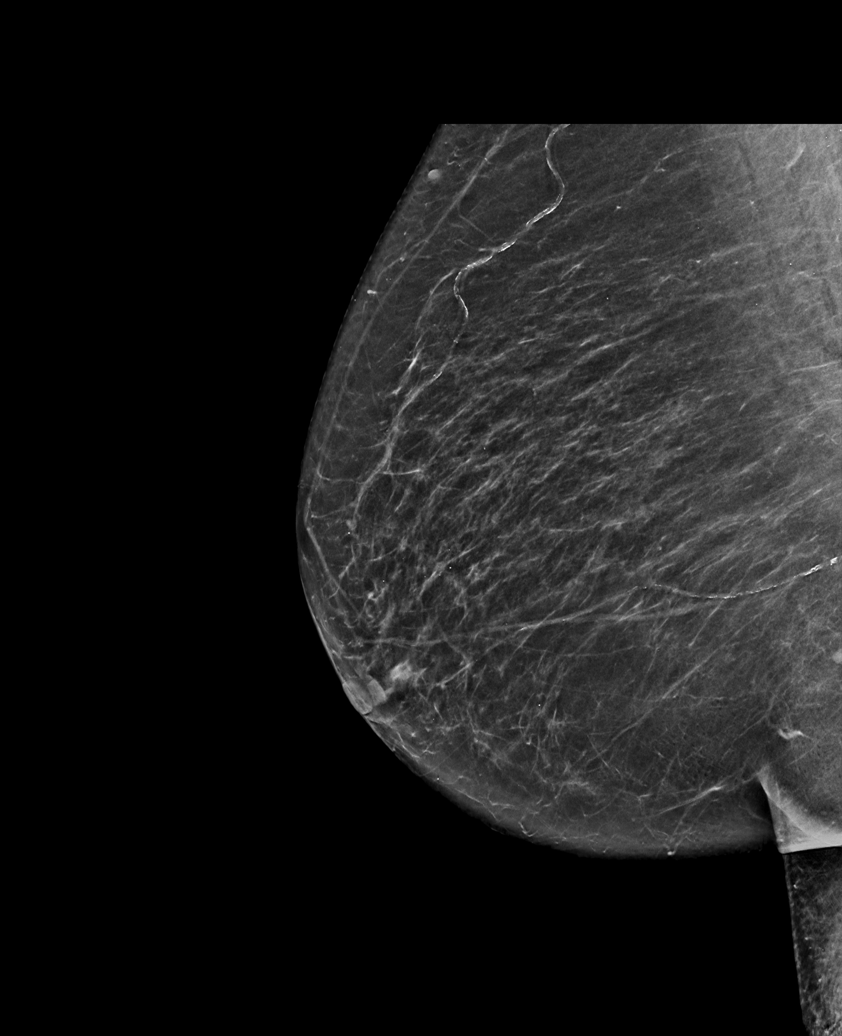

[L CC synth-2D (1 of 2)]
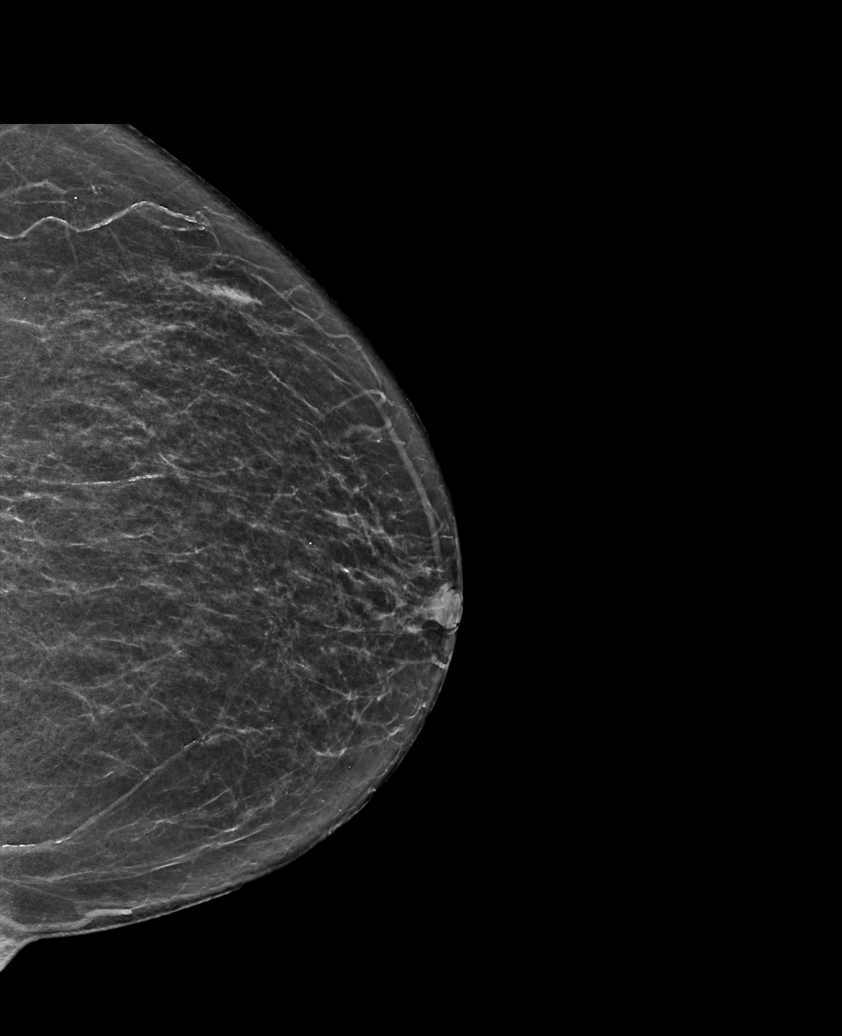

[R CC synth-2D]
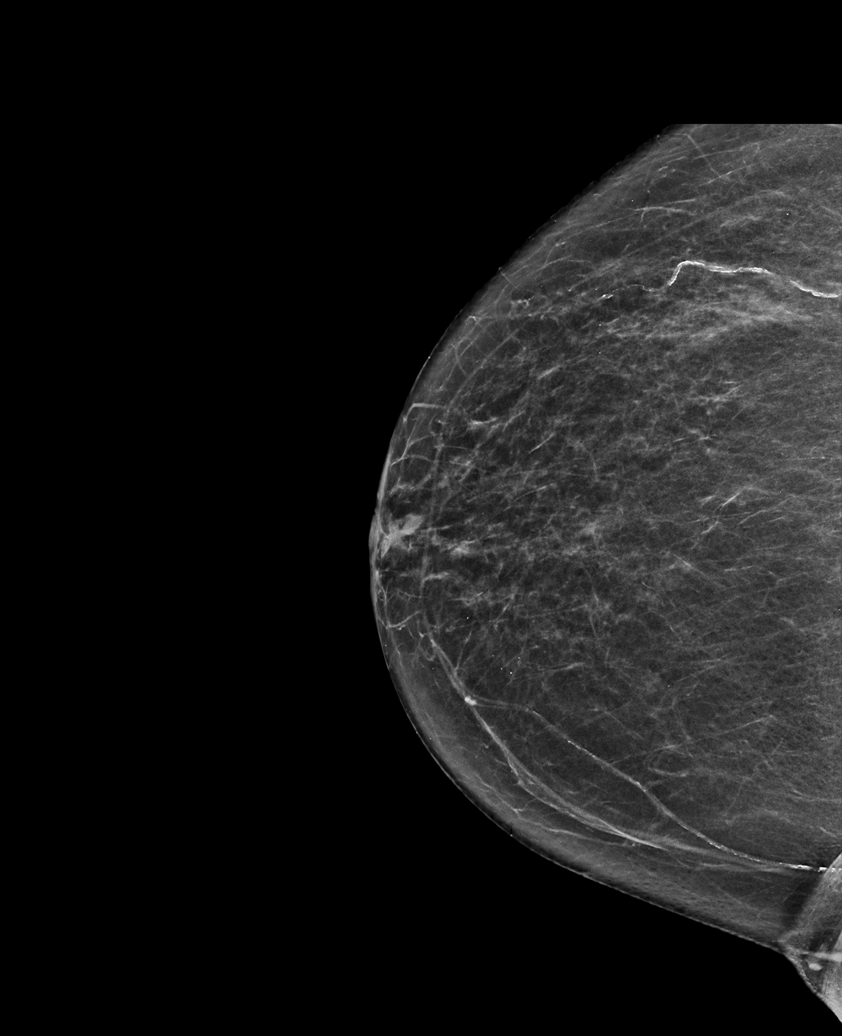

[L CC synth-2D (2 of 2)]
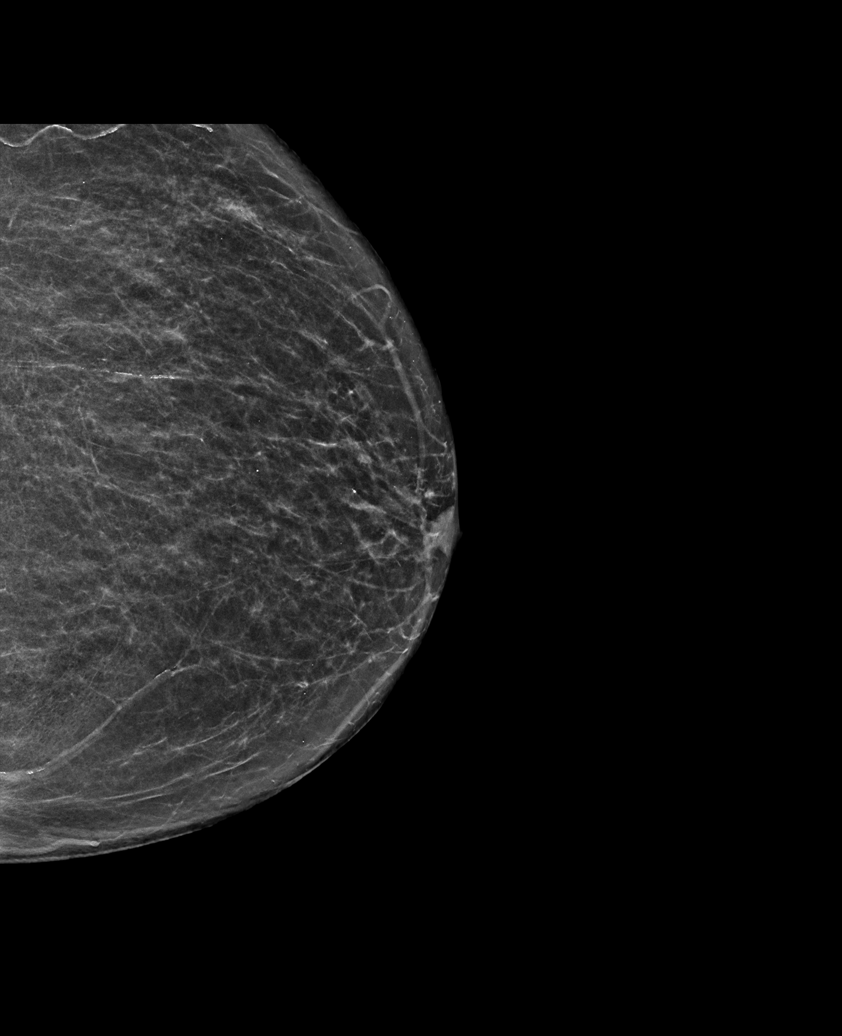

[L MLO synth-2D]
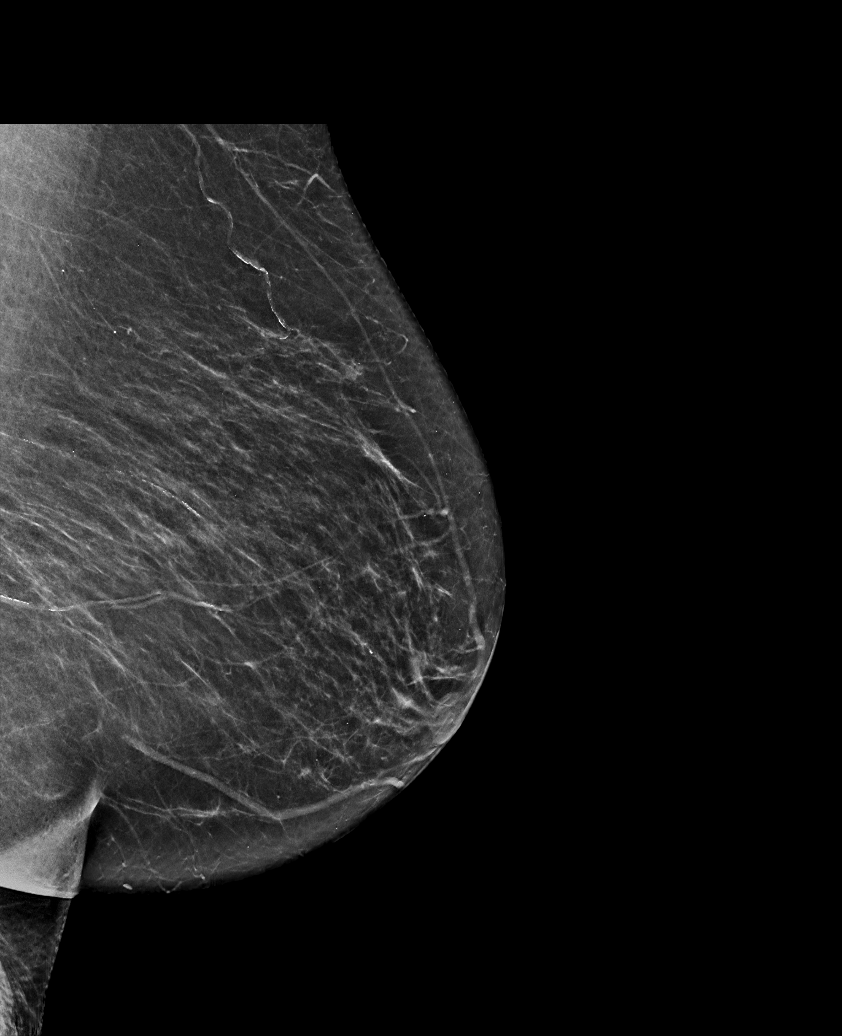

[L MLO tomo · tomo slice 33/65.0]
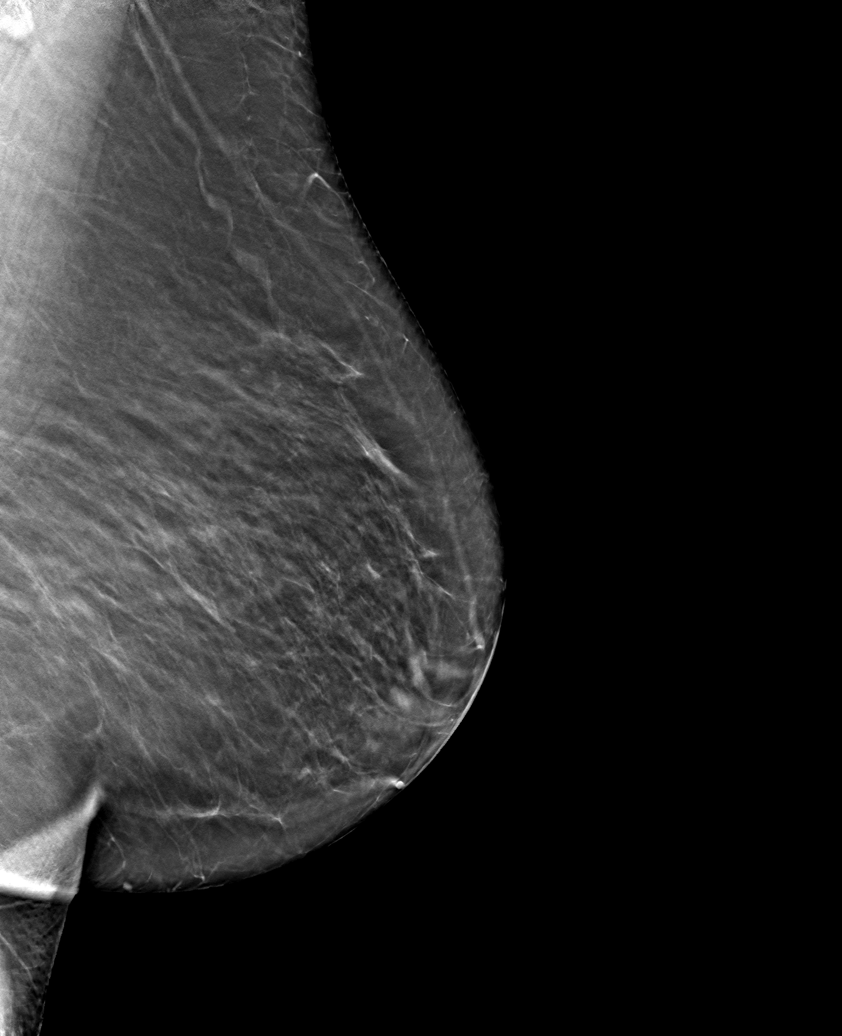

[6 of 30 positions shown; findings below may reference images not displayed]

ACR Breast Density Category b: There are scattered areas of
fibroglandular density.
FINDINGS: There are no findings suspicious for malignancy.
IMPRESSION: No mammographic evidence of malignancy. A result letter of this
screening mammogram will be mailed directly to the patient.

RECOMMENDATION:
Screening mammogram in one year. (Code:51-O-LD2)

BI-RADS CATEGORY  1: Negative.

## 2023-05-06 ENCOUNTER — Other Ambulatory Visit: Payer: Self-pay | Admitting: Family Medicine

## 2023-05-07 ENCOUNTER — Encounter: Payer: Self-pay | Admitting: Family Medicine

## 2023-05-13 ENCOUNTER — Encounter: Payer: Self-pay | Admitting: Family Medicine

## 2023-05-13 ENCOUNTER — Ambulatory Visit (INDEPENDENT_AMBULATORY_CARE_PROVIDER_SITE_OTHER): Payer: Medicare HMO | Admitting: Family Medicine

## 2023-05-13 ENCOUNTER — Ambulatory Visit (INDEPENDENT_AMBULATORY_CARE_PROVIDER_SITE_OTHER)
Admission: RE | Admit: 2023-05-13 | Discharge: 2023-05-13 | Disposition: A | Discharge status: Home / Self Care | Payer: Medicare HMO | Source: Ambulatory Visit | Attending: Family Medicine | Admitting: Family Medicine

## 2023-05-13 VITALS — BP 136/86 | HR 64 | Temp 98.3°F | Ht 64.5 in | Wt 186.2 lb

## 2023-05-13 DIAGNOSIS — R5382 Chronic fatigue, unspecified: Secondary | ICD-10-CM

## 2023-05-13 DIAGNOSIS — R6889 Other general symptoms and signs: Secondary | ICD-10-CM | POA: Diagnosis not present

## 2023-05-13 DIAGNOSIS — R062 Wheezing: Secondary | ICD-10-CM | POA: Insufficient documentation

## 2023-05-13 DIAGNOSIS — R29898 Other symptoms and signs involving the musculoskeletal system: Secondary | ICD-10-CM | POA: Insufficient documentation

## 2023-05-13 DIAGNOSIS — R519 Headache, unspecified: Secondary | ICD-10-CM | POA: Diagnosis not present

## 2023-05-13 DIAGNOSIS — I10 Essential (primary) hypertension: Secondary | ICD-10-CM

## 2023-05-13 DIAGNOSIS — Z79899 Other long term (current) drug therapy: Secondary | ICD-10-CM

## 2023-05-13 LAB — CK: Total CK: 127 U/L (ref 7–177)

## 2023-05-13 LAB — CBC WITH DIFFERENTIAL/PLATELET
Basophils Absolute: 0 10*3/uL (ref 0.0–0.1)
Basophils Relative: 0.3 % (ref 0.0–3.0)
Eosinophils Absolute: 0.1 10*3/uL (ref 0.0–0.7)
Eosinophils Relative: 1.4 % (ref 0.0–5.0)
HCT: 43.3 % (ref 36.0–46.0)
Hemoglobin: 14.1 g/dL (ref 12.0–15.0)
Lymphocytes Relative: 24.6 % (ref 12.0–46.0)
Lymphs Abs: 1.6 10*3/uL (ref 0.7–4.0)
MCHC: 32.5 g/dL (ref 30.0–36.0)
MCV: 92.1 fL (ref 78.0–100.0)
Monocytes Absolute: 0.6 10*3/uL (ref 0.1–1.0)
Monocytes Relative: 8.9 % (ref 3.0–12.0)
Neutro Abs: 4.2 10*3/uL (ref 1.4–7.7)
Neutrophils Relative %: 64.8 % (ref 43.0–77.0)
Platelets: 244 10*3/uL (ref 150.0–400.0)
RBC: 4.71 Mil/uL (ref 3.87–5.11)
RDW: 13.7 % (ref 11.5–15.5)
WBC: 6.5 10*3/uL (ref 4.0–10.5)

## 2023-05-13 LAB — TSH: TSH: 1.23 u[IU]/mL (ref 0.35–5.50)

## 2023-05-13 LAB — IRON: Iron: 93 ug/dL (ref 42–145)

## 2023-05-13 LAB — COMPREHENSIVE METABOLIC PANEL
ALT: 18 U/L (ref 0–35)
AST: 21 U/L (ref 0–37)
Albumin: 4.3 g/dL (ref 3.5–5.2)
Alkaline Phosphatase: 62 U/L (ref 39–117)
BUN: 16 mg/dL (ref 6–23)
CO2: 30 meq/L (ref 19–32)
Calcium: 9.9 mg/dL (ref 8.4–10.5)
Chloride: 101 meq/L (ref 96–112)
Creatinine, Ser: 0.79 mg/dL (ref 0.40–1.20)
GFR: 70.81 mL/min (ref >60.00)
Glucose, Bld: 97 mg/dL (ref 70–99)
Potassium: 4.6 meq/L (ref 3.5–5.1)
Sodium: 140 meq/L (ref 135–145)
Total Bilirubin: 0.7 mg/dL (ref 0.2–1.2)
Total Protein: 6.6 g/dL (ref 6.0–8.3)

## 2023-05-13 LAB — VITAMIN B12: Vitamin B-12: 463 pg/mL (ref 211–911)

## 2023-05-13 NOTE — Assessment & Plan Note (Signed)
Reviewed cardiology notes, echo, carotid results and labs  Good periph pulses and vitals and exam today   Some wheezing reported (none on exam) Cxr ordered   May be conditioned  Discussed self care and opt for strength training at the gym

## 2023-05-13 NOTE — Assessment & Plan Note (Signed)
Reassuring exam May be deconditioning  Reviewed last CT and MRI brain   [facility]  Labs ordered incl CK today  On a statin but no pain at this time

## 2023-05-13 NOTE — Progress Notes (Signed)
Subjective:    Patient ID: Nicole Suarez, female    DOB: 05-04-1941, 82 y.o.   MRN: [id number]  HPI  Wt Readings from Last 3 Encounters:  05/13/23 186 lb 4 oz (84.5 kg)  03/26/23 183 lb 2 oz (83.1 kg)  02/19/23 178 lb 4 oz (80.9 kg)   31.48 kg/m  Vitals:   05/13/23 1021  BP: 136/86  Pulse: 64  Temp: 98.3 F (36.8 C)  SpO2: 99%     Pt presents for c/o  Leg weakness Dizziness   Traveled out of the country  Big family reunion- 30 people / had a birthday party for her  Stayed in [place] and helped sister with her chemo   Weakness in legs  Used a cane in [place] due to uneven ground  Walked quite a bit  Some shortness of breath  Little wheezing  Some ankle swelling / may have had more sodium in diet then  Now that is better   In past 2 weeks -has episodic headache on left side and some faint feeling  Did not faint / but felt like she could   Did have wheezing when she was really exerting herself   Lot of stress this year  Lost husband Then traveled a lot  Car issues  Brain is on overdrive   No leg pain  Just leg fatigue    She did see cardiology in may for exertional dyspnea, near syncope and HTN Had reassuring carotid dopplers and echocardiogram   CT head w/o contrast was normal 01/2023   MRI head 11/2022   IMPRESSION: 1. On dynamic and postcontrast imaging, there is a somewhat hypoenhancing area in the posterior left greater than right aspect of the sella, which is only seen on the sagittal images. 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 No evidence of mass effect on surrounding structures or deviation of the infundibulum. 2. No acute intracranial process.   Lab Results  Component Value Date   NA 138 02/19/2023   K 3.6 02/19/2023   CO2 30 02/19/2023   GLUCOSE 91 02/19/2023   BUN 22 02/19/2023   CREATININE 0.75 02/19/2023   CALCIUM 9.5 02/19/2023   GFR 75.49 02/19/2023   GFRNONAA >60  01/22/2022   Lab Results  Component Value Date   ALT 17 11/18/2022   AST 21 11/18/2022   ALKPHOS 70 11/18/2022   BILITOT 0.5 11/18/2022   Lab Results  Component Value Date   WBC 5.9 11/18/2022   HGB 14.1 11/18/2022   HCT 41.4 11/18/2022   MCV 87.9 11/18/2022   PLT 261.0 11/18/2022   Lab Results  Component Value Date   TSH 1.36 11/18/2022   Lab Results  Component Value Date   VITAMINB12 634 11/18/2022   Last vitamin D Lab Results  Component Value Date   VD25OH 38.35 11/18/2022   Last prolactin 11/2022   Eye exam was in [place]    Was going [facility] the [facility] for [facility]  Using machine for leg strength   Drinking more water now  Not as much when traveling      Patient Active Problem List   Diagnosis Date Noted   Leg weakness, bilateral 05/13/2023   Left-sided headache 05/13/2023   Wheezing 05/13/2023   Nocturnal leg cramps 02/19/2023   Hypokalemia 02/19/2023   Mixed incontinence 02/19/2023   Right shoulder pain 01/13/2023   Arm paresthesia, right 11/18/2022   Fatigue 11/17/2022   Impaired exercise tolerance  11/17/2022   Dizziness 10/28/2022   Grief reaction 10/28/2022   Current use of proton pump inhibitor 05/24/2021   Low back pain 09/13/2020   Estrogen deficiency 04/26/2020   Elevated glucose level 04/26/2020   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   GERD (gastroesophageal reflux disease) 07/26/2019   Essential hypertension 07/26/2019   Hyperlipidemia 07/26/2019   Allergic rhinitis 07/26/2019   Urge incontinence 07/26/2019   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   RLS (restless legs syndrome) 07/26/2019   Colon polyp 07/26/2019   Osteopenia 07/26/2019   History of Mohs micrographic surgery for skin cancer 07/26/2019   Past Medical History:  Diagnosis Date   Arthritis    Cataract    bilateral lens implants   GERD (gastroesophageal reflux disease)    History of allergy    History of basal cell carcinoma (BCC)  2015   right temple Mohs   History of blood transfusion    History of SCC (squamous cell carcinoma) of skin 2017   right upper arm excised   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    History of urinary incontinence    History of UTI    Hyperlipidemia    Hypertension    Neuromuscular disorder (HCC)    restless legs    PONV (postoperative nausea and vomiting)    Past Surgical History:  Procedure Laterality Date   APPENDECTOMY  1963   CATARACT EXTRACTION, BILATERAL     08/2019, 09/2019   CESAREAN SECTION  1964   MICRODISCECTOMY LUMBAR     L3L4   RECTOCELE REPAIR  2009   REPLACEMENT TOTAL KNEE  2010   REVERSE SHOULDER ARTHROPLASTY Left 01/31/2022   Procedure: REVERSE SHOULDER ARTHROPLASTY;  Surgeon: Francena Hanly, MD;  Location: [facility];  Service: Orthopedics;  Laterality: Left;   ROTATOR CUFF REPAIR  2000   urterine suspension  1971   Social History   Tobacco Use   Smoking status: Never   Smokeless tobacco: Never  Vaping Use   Vaping status: Never Used  Substance Use Topics   Alcohol use: Not Currently    Comment: rare   Drug use: Never   Family History  Problem Relation Age of Onset   Early death Mother    Cancer Father    COPD Father    Lung cancer Father    Pancreatic cancer Sister    Alcohol abuse Brother    Heart disease Brother    Hypertension Brother    COPD Maternal Grandmother    Colon cancer Neg Hx    Esophageal cancer Neg Hx    Stomach cancer Neg Hx    Rectal cancer Neg Hx    Colon polyps Neg Hx    Allergies  Allergen Reactions   Ace Inhibitors Other (See Comments)    THROAT SWELLING   Amlodipine     Fatigue    Requip [Ropinirole]     Vision change    Shrimp [Shellfish Allergy] Nausea And Vomiting   Aleve [Naproxen] Rash   Current Outpatient Medications on File Prior to Visit  Medication Sig Dispense Refill   bromocriptine (PARLODEL) 2.5 MG tablet TAKE 1 TABLET DAILY 90 tablet 3   Calcium Carbonate-Vit  D-Min (CALCIUM 1200 PO) Take 2 capsules by mouth daily.     carvedilol (COREG) 12.5 MG tablet TAKE 1 TABLET TWICE A DAY WITH MEALS 180 tablet 1   chlorthalidone (HYGROTON) 25 MG tablet TAKE ONE-HALF (1/2) TABLET DAILY 45 tablet 1   cholecalciferol (VITAMIN  D3) 25 MCG (1000 UNIT) tablet Take 1,000 Units by mouth daily.     CRANBERRY-VITAMIN C-D MANNOSE PO Take 2 capsules by mouth daily.     estradiol (ESTRACE VAGINAL) 0.1 MG/GM vaginal cream Pea sized amount to urethra/vulvar area twice weekly 42.5 g 1   fluticasone (FLONASE) 50 MCG/ACT nasal spray Place 2 sprays into both nostrils daily. 48 g 1   ibuprofen (ADVIL) 400 MG tablet Take 400 mg by mouth every 8 (eight) hours as needed.     levocetirizine (XYZAL) 5 MG tablet Take 5 mg by mouth every evening.     Multiple Vitamin (MULTIVITAMIN) capsule Take 1 capsule by mouth daily.     Multiple Vitamins-Minerals (PRESERVISION AREDS 2 PO) Take 2 capsules by mouth daily.     OVER THE COUNTER MEDICATION Turmeric 1000mg  once daily     OVER THE COUNTER MEDICATION D-mannose- 1000 mg daily for bladder health     pantoprazole (PROTONIX) 40 MG tablet TAKE 1 TABLET DAILY 90 tablet 1   Polyethyl Glycol-Propyl Glycol (SYSTANE OP) Place 1 drop into both eyes daily as needed (dry eyes).     polyethylene glycol powder (GLYCOLAX/MIRALAX) 17 GM/SCOOP powder Take 17 g by mouth daily as needed for moderate constipation.     rosuvastatin (CRESTOR) 10 MG tablet TAKE 1 TABLET AT BEDTIME 90 tablet 1   senna (SENOKOT) 8.6 MG TABS tablet Take 1-2 tablets by mouth at bedtime as needed for mild constipation.     No current facility-administered medications on file prior to visit.    Review of Systems  Constitutional:  Positive for fatigue. Negative for activity change, appetite change, fever and unexpected weight change.  HENT:  Negative for congestion, ear pain, rhinorrhea, sinus pressure and sore throat.   Eyes:  Positive for discharge. Negative for pain, redness and  visual disturbance.  Respiratory:  Positive for shortness of breath and wheezing. Negative for apnea, cough, choking, chest tightness and stridor.        Wheezing occational on exertion   Cardiovascular:  Negative for chest pain and palpitations.  Gastrointestinal:  Negative for abdominal pain, blood in stool, constipation and diarrhea.  Endocrine: Negative for polydipsia and polyuria.  Genitourinary:  Negative for dysuria, frequency and urgency.  Musculoskeletal:  Negative for arthralgias, back pain and myalgias.  Skin:  Negative for pallor and rash.  Allergic/Immunologic: Negative for environmental allergies.  Neurological:  Positive for weakness, light-headedness and headaches. Negative for dizziness, tremors, seizures, syncope, facial asymmetry, speech difficulty and numbness.  Hematological:  Negative for adenopathy. Does not bruise/bleed easily.  Psychiatric/Behavioral:  Negative for decreased concentration and dysphoric mood. The patient is not nervous/anxious.        Objective:   Physical Exam Constitutional:      General: She is not in acute distress.    Appearance: Normal appearance. She is well-developed. She is obese. She is not ill-appearing or diaphoretic.  HENT:     Head: Normocephalic and atraumatic.     Right Ear: External ear normal.     Left Ear: External ear normal.     Nose: Nose normal. No congestion or rhinorrhea.     Mouth/Throat:     Pharynx: No oropharyngeal exudate.  Eyes:     General: No scleral icterus.       Right eye: No discharge.        Left eye: No discharge.     Conjunctiva/sclera: Conjunctivae normal.     Pupils: Pupils are equal, round, and reactive to light.  Comments: No nystagmus  Neck:     Thyroid: No thyromegaly.     Vascular: No carotid bruit or JVD.     Trachea: No tracheal deviation.  Cardiovascular:     Rate and Rhythm: Normal rate and regular rhythm.     Heart sounds: Normal heart sounds. No murmur heard. Pulmonary:      Effort: Pulmonary effort is normal. No respiratory distress.     Breath sounds: Normal breath sounds. No stridor. No wheezing, rhonchi or rales.     Comments: No wheeze even on forced exp Abdominal:     General: Bowel sounds are normal. There is no distension.     Palpations: Abdomen is soft. There is no mass.     Tenderness: There is no abdominal tenderness.  Musculoskeletal:        General: No tenderness.     Cervical back: Full passive range of motion without pain, normal range of motion and neck supple.  Lymphadenopathy:     Cervical: No cervical adenopathy.  Skin:    General: Skin is warm and dry.     Coloration: Skin is not pale.     Findings: No rash.  Neurological:     Mental Status: She is alert and oriented to person, place, and time.     Cranial Nerves: No cranial nerve deficit, dysarthria or facial asymmetry.     Sensory: Sensation is intact. No sensory deficit.     Motor: No weakness, tremor, atrophy, abnormal muscle tone, seizure activity or pronator drift.     Coordination: Romberg sign negative. Coordination normal. Finger-Nose-Finger Test normal.     Gait: Gait normal.     Deep Tendon Reflexes: Reflexes are normal and symmetric. Reflexes normal.     Comments: No focal cerebellar signs   Psychiatric:        Behavior: Behavior normal.        Thought Content: Thought content normal.           Assessment & Plan:   Problem List Items Addressed This Visit       Cardiovascular and Mediastinum   Essential hypertension    bp in fair control at this time  BP Readings from Last 1 Encounters:  05/13/23 136/86  Pulse of 64  No changes needed Most recent labs reviewed  Disc lifstyle change with low sodium diet and exercise  Doing fine w/o amlodipine  Continues Coreg 12.5 mg bid Chlorthalidone 12.5 mg daily  Lab today      Relevant Orders   CBC with Differential/Platelet   TSH   Comprehensive metabolic panel     Other   Current use of proton pump  inhibitor    Some fatigue and weakness B12 level today       Relevant Orders   Iron   Vitamin B12   Fatigue    May be multifactorial Traveling/ grief/ caregiving for sister   Lab today  Reassuring cardiology work up  Does have exercise intolerance   Vitals and exam reassuring      Relevant Orders   Iron   CBC with Differential/Platelet   Vitamin B12   TSH   Comprehensive metabolic panel   Impaired exercise tolerance    Reviewed cardiology notes, echo, carotid results and labs  Good periph pulses and vitals and exam today   Some wheezing reported (none on exam) Cxr ordered   May be conditioned  Discussed self care and opt for strength training at the gym  Relevant Orders   DG Chest 2 View   CK   Iron   Left-sided headache    Intermittent Sometimes associated with light headed feeling  Reviewed CT and MRI brain from May, reassuring  Normal exam today  No falls   Referral made to neurology for further eval /treatment       Relevant Orders   Ambulatory referral to Neurology   Leg weakness, bilateral - Primary    Reassuring exam May be deconditioning  Reviewed last CT and MRI brain   [facility]  Labs ordered incl CK today  On a statin but no pain at this time      Relevant Orders   CK   Ambulatory referral to Neurology   Wheezing    Pt notes this has occurred only with exertion  Normal exam today  Cxr ordered       Relevant Orders   DG Chest 2 View

## 2023-05-13 NOTE — Assessment & Plan Note (Signed)
Some fatigue and weakness B12 level today

## 2023-05-13 NOTE — Assessment & Plan Note (Signed)
Intermittent Sometimes associated with light headed feeling  Reviewed CT and MRI brain from May, reassuring  Normal exam today  No falls   Referral made to neurology for further eval /treatment

## 2023-05-13 NOTE — Assessment & Plan Note (Signed)
bp in fair control at this time  BP Readings from Last 1 Encounters:  05/13/23 136/86  Pulse of 64  No changes needed Most recent labs reviewed  Disc lifstyle change with low sodium diet and exercise  Doing fine w/o amlodipine  Continues Coreg 12.5 mg bid Chlorthalidone 12.5 mg daily  Lab today

## 2023-05-13 NOTE — Assessment & Plan Note (Signed)
Pt notes this has occurred only with exertion  Normal exam today  Cxr ordered

## 2023-05-13 NOTE — Patient Instructions (Addendum)
[facility]  Maybe hire a trainer for a session to walk you through machines and strength training   Add some strength training to your routine, this is important for bone and brain health and can reduce your risk of falls and help your body use insulin properly and regulate weight  Light weights, exercise bands , and internet videos are a good way to start  Yoga (chair or regular), machines , floor exercises or a gym with machines are also good options    Diet wise Try to get most of your carbohydrates from produce (with the exception of white potatoes) and whole grains Eat less bread/pasta/rice/snack foods/cereals/sweets and other items from the middle of the grocery store (processed carbs)  Stay very well hydrated   Labs today   Chest xray today   I put the referral in for neurology  Please let us know if you don't hear in 1-2 weeks

## 2023-05-13 NOTE — Assessment & Plan Note (Signed)
May be multifactorial Traveling/ grief/ caregiving for sister   Lab today  Reassuring cardiology work up  Does have exercise intolerance   Vitals and exam reassuring

## 2023-05-16 ENCOUNTER — Ambulatory Visit: Payer: Medicare HMO | Admitting: Family Medicine

## 2023-05-30 ENCOUNTER — Other Ambulatory Visit: Payer: Self-pay | Admitting: Family Medicine

## 2023-06-09 ENCOUNTER — Encounter: Payer: Self-pay | Admitting: Family Medicine

## 2023-06-09 ENCOUNTER — Ambulatory Visit (INDEPENDENT_AMBULATORY_CARE_PROVIDER_SITE_OTHER): Payer: Medicare HMO | Admitting: Family Medicine

## 2023-06-09 VITALS — BP 138/82 | HR 89 | Temp 98.2°F | Resp 16 | Ht 64.5 in | Wt 189.0 lb

## 2023-06-09 DIAGNOSIS — R350 Frequency of micturition: Secondary | ICD-10-CM

## 2023-06-09 DIAGNOSIS — R8290 Unspecified abnormal findings in urine: Secondary | ICD-10-CM

## 2023-06-09 NOTE — Patient Instructions (Signed)
Keep up the great fluid intake   Bring a urine sample in later today when you can   If symptoms suddenly worsen let us know   We will make a plan with the result

## 2023-06-09 NOTE — Assessment & Plan Note (Addendum)
Worse than usual since Friday  Also urgency  No dysuria Does have some generalized bladder discomfort   History of uti -last in sept   e coli pan sensitive (keflex worked)   Pt could not give sample today so given container to take home and bring back  Will analyze this and get culture if needed , treat if positive  Encouraged continued good water intake  Call back and Er precautions noted in detail today   Addendum Urinalysis with rbc and nitrite  Sent keflex Pending culture

## 2023-06-09 NOTE — Progress Notes (Signed)
Subjective:    Patient ID: Nicole Suarez, female    DOB: [birth year], 82 y.o.   MRN: [id number]  HPI  Wt Readings from Last 3 Encounters:  06/09/23 189 lb (85.7 kg)  05/13/23 186 lb 4 oz (84.5 kg)  03/26/23 183 lb 2 oz (83.1 kg)   31.94 kg/m  Vitals:   06/09/23 1357  BP: 138/82  Pulse: 89  Resp: 16  Temp: 98.2 F (36.8 C)  SpO2: 98%     Pt presents with urinary symptoms  Started Friday  Frequency and urgeny  Last night in the bathroom every hour  Took azo  Last dose at 7 am  More tired than usual  A little achey in bladder area  Not burning to urinate   No nausea  No fever  No blood in urine      Had a uti in sept E coli pan sensitive Was treatment with keflex  Lab Results  Component Value Date   NA 140 05/13/2023   K 4.6 05/13/2023   CO2 30 05/13/2023   GLUCOSE 97 05/13/2023   BUN 16 05/13/2023   CREATININE 0.79 05/13/2023   CALCIUM 9.9 05/13/2023   GFR 70.81 05/13/2023   GFRNONAA >60 01/22/2022    Has been going [facility] [employer] [employer]    Patient Active Problem List   Diagnosis Date Noted   Urinary frequency 06/09/2023   Leg weakness, bilateral 05/13/2023   Left-sided headache 05/13/2023   Wheezing 05/13/2023   Nocturnal leg cramps 02/19/2023   Hypokalemia 02/19/2023   Mixed incontinence 02/19/2023   Right shoulder pain 01/13/2023   Arm paresthesia, right 11/18/2022   Fatigue 11/17/2022   Impaired exercise tolerance 11/17/2022   Dizziness 10/28/2022   Grief reaction 10/28/2022   Current use of proton pump inhibitor 05/24/2021   Low back pain 09/13/2020   Estrogen deficiency 04/26/2020   Elevated glucose level 04/26/2020   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   GERD (gastroesophageal reflux disease) 07/26/2019   Essential hypertension 07/26/2019   Hyperlipidemia 07/26/2019   Allergic rhinitis 07/26/2019   Urge incontinence 07/26/2019   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   RLS (restless legs  syndrome) 07/26/2019   Colon polyp 07/26/2019   Osteopenia 07/26/2019   History of Mohs micrographic surgery for skin cancer 07/26/2019   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    History of urinary incontinence    History of UTI    Hyperlipidemia    Hypertension    Neuromuscular disorder (HCC)    restless legs    PONV (postoperative nausea and vomiting)    Past Surgical History:  Procedure Laterality Date   APPENDECTOMY  1963   CATARACT EXTRACTION, BILATERAL     08/2019, 09/2019   CESAREAN SECTION  1964   MICRODISCECTOMY LUMBAR     L3L4   RECTOCELE REPAIR  2009   REPLACEMENT TOTAL KNEE  2010   REVERSE SHOULDER ARTHROPLASTY Left 01/31/2022   Procedure: REVERSE SHOULDER ARTHROPLASTY;  Surgeon: Francena Hanly, MD;  Location: [facility];  Service: Orthopedics;  Laterality: Left;  ROTATOR CUFF REPAIR  2000   urterine suspension  1971   Social History   Tobacco Use   Smoking status: Never   Smokeless tobacco: Never  Vaping Use   Vaping status: Never Used  Substance Use Topics   Alcohol use: Not Currently    Comment: rare   Drug use: Never   Family History  Problem Relation Age of Onset   Early death Mother    Cancer Father    COPD Father    Lung cancer Father    Pancreatic cancer Sister    Alcohol abuse Brother    Heart disease Brother    Hypertension Brother    COPD Maternal Grandmother    Colon cancer Neg Hx    Esophageal cancer Neg Hx    Stomach cancer Neg Hx    Rectal cancer Neg Hx    Colon polyps Neg Hx    Allergies  Allergen Reactions   Ace Inhibitors Other (See Comments)    THROAT  SWELLING   Amlodipine     Fatigue    Requip [Ropinirole]     Vision change    Shrimp [Shellfish Allergy] Nausea And Vomiting   Aleve [Naproxen] Rash   Current Outpatient Medications on File Prior to Visit  Medication Sig Dispense Refill   bromocriptine (PARLODEL) 2.5 MG tablet TAKE 1 TABLET DAILY 90 tablet 3   Calcium Carbonate-Vit D-Min (CALCIUM 1200 PO) Take 2 capsules by mouth daily.     carvedilol (COREG) 12.5 MG tablet TAKE 1 TABLET TWICE A DAY WITH MEALS 180 tablet 1   chlorthalidone (HYGROTON) 25 MG tablet TAKE ONE-HALF (1/2) TABLET DAILY 45 tablet 3   cholecalciferol (VITAMIN D3) 25 MCG (1000 UNIT) tablet Take 1,000 Units by mouth daily.     CRANBERRY-VITAMIN C-D MANNOSE PO Take 2 capsules by mouth daily.     estradiol (ESTRACE VAGINAL) 0.1 MG/GM vaginal cream Pea sized amount to urethra/vulvar area twice weekly 42.5 g 1   fluticasone (FLONASE) 50 MCG/ACT nasal spray Place 2 sprays into both nostrils daily. 48 g 1   ibuprofen (ADVIL) 400 MG tablet Take 400 mg by mouth every 8 (eight) hours as needed.     levocetirizine (XYZAL) 5 MG tablet Take 5 mg by mouth every evening.     Multiple Vitamin (MULTIVITAMIN) capsule Take 1 capsule by mouth daily.     Multiple Vitamins-Minerals (PRESERVISION AREDS 2 PO) Take 2 capsules by mouth daily.     OVER THE COUNTER MEDICATION Turmeric 1000mg  once daily     OVER THE COUNTER MEDICATION D-mannose- 1000 mg daily for bladder health     pantoprazole (PROTONIX) 40 MG tablet TAKE 1 TABLET DAILY 90 tablet 1   Polyethyl Glycol-Propyl Glycol (SYSTANE OP) Place 1 drop into both eyes daily as needed (dry eyes).     polyethylene glycol powder (GLYCOLAX/MIRALAX) 17 GM/SCOOP powder Take 17 g by mouth daily as needed for moderate constipation.     rosuvastatin (CRESTOR) 10 MG tablet TAKE 1 TABLET AT BEDTIME 90 tablet 1   senna (SENOKOT) 8.6 MG TABS tablet Take 1-2 tablets by mouth at bedtime as needed for mild constipation.     No current  facility-administered medications on file prior to visit.    Review of Systems  Constitutional:  Negative for activity change, appetite change, fatigue, fever and unexpected weight change.  HENT:  Negative for congestion, ear pain, rhinorrhea, sinus pressure and sore throat.   Eyes:  Negative for pain, redness and visual disturbance.  Respiratory:  Negative for cough, shortness of breath and wheezing.   Cardiovascular:  Negative for chest pain and palpitations.  Gastrointestinal:  Negative for abdominal pain, blood in stool, constipation and diarrhea.  Endocrine: Negative for polydipsia and polyuria.  Genitourinary:  Positive for frequency and urgency. Negative for decreased urine volume, dysuria, pelvic pain, vaginal bleeding and vaginal discharge.       Some bladder discomfort    Musculoskeletal:  Negative for arthralgias, back pain and myalgias.  Skin:  Negative for pallor and rash.  Allergic/Immunologic: Negative for environmental allergies.  Neurological:  Negative for dizziness, syncope and headaches.  Hematological:  Negative for adenopathy. Does not bruise/bleed easily.  Psychiatric/Behavioral:  Negative for decreased concentration and dysphoric mood. The patient is not nervous/anxious.        Objective:   Physical Exam Constitutional:      General: She is not in acute distress.    Appearance: Normal appearance. She is well-developed. She is obese. She is not ill-appearing or diaphoretic.  HENT:     Head: Normocephalic and atraumatic.  Eyes:     Conjunctiva/sclera: Conjunctivae normal.     Pupils: Pupils are equal, round, and reactive to light.  Neck:     Thyroid: No thyromegaly.     Vascular: No carotid bruit or JVD.  Cardiovascular:     Rate and Rhythm: Normal rate and regular rhythm.     Heart sounds: Normal heart sounds.     No gallop.  Pulmonary:     Effort: Pulmonary effort is normal. No respiratory distress.     Breath sounds: Normal breath sounds. No stridor.  No wheezing, rhonchi or rales.  Abdominal:     General: There is no distension or abdominal bruit.     Palpations: Abdomen is soft. There is no mass.     Tenderness: There is no abdominal tenderness. There is no right CVA tenderness, left CVA tenderness or guarding.     Comments: .No suprapubic tenderness or fullness    Musculoskeletal:     Cervical back: Normal range of motion and neck supple.     Right lower leg: No edema.     Left lower leg: No edema.  Lymphadenopathy:     Cervical: No cervical adenopathy.  Skin:    General: Skin is warm and dry.     Coloration: Skin is not pale.     Findings: No rash.  Neurological:     Mental Status: She is alert.     Coordination: Coordination normal.     Deep Tendon Reflexes: Reflexes are normal and symmetric. Reflexes normal.  Psychiatric:        Mood and Affect: Mood normal.           Assessment & Plan:   Problem List Items Addressed This Visit       Other   Urinary frequency - Primary    Worse than usual since Friday  Also urgency  No dysuria Does have some generalized bladder discomfort   History of uti -last in sept   e coli pan sensitive (keflex worked)   Pt could not give sample today so given container to take home and bring back  Will analyze this and get culture if needed , treat if positive  Encouraged continued good water intake  Call back and Er precautions noted in detail today         Relevant Orders   POCT Urinalysis Dipstick (Automated)

## 2023-06-10 ENCOUNTER — Other Ambulatory Visit: Payer: Self-pay | Admitting: Family Medicine

## 2023-06-10 LAB — POC URINALSYSI DIPSTICK (AUTOMATED)
Bilirubin, UA: NEGATIVE
Blood, UA: 50 — AB
Glucose, UA: NEGATIVE
Ketones, UA: NEGATIVE
Leukocytes, UA: NEGATIVE
Nitrite, UA: POSITIVE — AB
Protein, UA: POSITIVE — AB
Spec Grav, UA: 1.02 (ref 1.010–1.025)
Urobilinogen, UA: 0.2 U/dL
pH, UA: 6 (ref 5.0–8.0)

## 2023-06-10 MED ORDER — CEPHALEXIN 500 MG PO CAPS: 500.0000 mg | ORAL_CAPSULE | Freq: Two times a day (BID) | ORAL | 0 refills | Status: DC

## 2023-06-10 NOTE — Addendum Note (Signed)
Addended by: Shon Millet on: 06/10/2023 12:56 PM   Modules accepted: Orders

## 2023-06-10 NOTE — Addendum Note (Signed)
Addended by: Roxy Manns A on: 06/10/2023 01:55 PM   Modules accepted: Orders

## 2023-06-12 LAB — URINE CULTURE
MICRO NUMBER:: 15782006
Result:: NO GROWTH
SPECIMEN QUALITY:: ADEQUATE

## 2023-06-16 ENCOUNTER — Encounter: Payer: Self-pay | Admitting: Family Medicine

## 2023-07-01 ENCOUNTER — Telehealth: Payer: Self-pay | Admitting: *Deleted

## 2023-07-01 ENCOUNTER — Telehealth: Payer: Self-pay | Admitting: Cardiovascular Disease

## 2023-07-01 NOTE — Telephone Encounter (Signed)
   Pre-operative Risk Assessment  Last visit: 11/22/2022  Next visit: none Patient Name: Nicole Suarez  DOB: [birth year] MRN: [id number]      Request for Surgical Clearance    Procedure:   right reverse shoulder arthroplasty  Date of Surgery:  Clearance TBD                                 Surgeon:  Dr. Francena Hanly Surgeon's Group or Practice Name:  [facility] Phone number:  [phone] Fax number:  [phone]   Type of Clearance Requested:   - Medical    Type of Anesthesia:  Not Indicated   Additional requests/questions:    Sharen Hones   07/01/2023, 10:50 AM

## 2023-07-01 NOTE — Telephone Encounter (Signed)
Pt states her surgery wont be until the 1st week of March 2025, though surgeon will not schedule until her has clearance. Med rec and consent are done. Tele preop appt  08/15/23.

## 2023-07-01 NOTE — Telephone Encounter (Signed)
Pt states her surgery wont be until the 1st week of March 2025, though surgeon will not schedule until her has clearance. Med rec and consent are done. Tele preop appt  08/15/23.     Patient Consent for Virtual Visit        Adriene Mensing has provided verbal consent on 07/01/2023 for a virtual visit (video or telephone).   CONSENT FOR VIRTUAL VISIT FOR:  Nicole Suarez  By participating in this virtual visit I agree to the following:  I hereby voluntarily request, consent and authorize [facility] and its employed or contracted physicians, physician assistants, nurse practitioners or other licensed health care professionals (the Practitioner), to provide me with telemedicine health care services (the "Services") as deemed necessary by the treating Practitioner. I acknowledge and consent to receive the Services by the Practitioner via telemedicine. I understand that the telemedicine visit will involve communicating with the Practitioner through live audiovisual communication technology and the disclosure of certain medical information by electronic transmission. I acknowledge that I have been given the opportunity to request an in-person assessment or other available alternative prior to the telemedicine visit and am voluntarily participating in the telemedicine visit.  I understand that I have the right to withhold or withdraw my consent to the use of telemedicine in the course of my care at any time, without affecting my right to future care or treatment, and that the Practitioner or I may terminate the telemedicine visit at any time. I understand that I have the right to inspect all information obtained and/or recorded in the course of the telemedicine visit and may receive copies of available information for a reasonable fee.  I understand that some of the potential risks of receiving the Services via telemedicine include:  Delay or interruption in medical evaluation due to technological  equipment failure or disruption; Information transmitted may not be sufficient (e.g. poor resolution of images) to allow for appropriate medical decision making by the Practitioner; and/or  In rare instances, security protocols could fail, causing a breach of personal health information.  Furthermore, I acknowledge that it is my responsibility to provide information about my medical history, conditions and care that is complete and accurate to the best of my ability. I acknowledge that Practitioner's advice, recommendations, and/or decision may be based on factors not within their control, such as incomplete or inaccurate data provided by me or distortions of diagnostic images or specimens that may result from electronic transmissions. I understand that the practice of medicine is not an exact science and that Practitioner makes no warranties or guarantees regarding treatment outcomes. I acknowledge that a copy of this consent can be made available to me via my patient portal [facility] MyChart), or I can request a printed copy by calling the office of [facility].    I understand that my insurance will be billed for this visit.   I have read or had this consent read to me. I understand the contents of this consent, which adequately explains the benefits and risks of the Services being provided via telemedicine.  I have been provided ample opportunity to ask questions regarding this consent and the Services and have had my questions answered to my satisfaction. I give my informed consent for the services to be provided through the use of telemedicine in my medical care

## 2023-07-01 NOTE — Telephone Encounter (Signed)
   Name: Nicole Suarez  DOB: 04-Sep-1940  MRN: [id number]  Primary Cardiologist: None   Preoperative team, please contact this patient and set up a phone call appointment for further preoperative risk assessment. Please obtain consent and complete medication review. Thank you for your help.  I confirm that guidance regarding antiplatelet and oral anticoagulation therapy has been completed and, if necessary, noted below (none requested).   I also confirmed the patient resides in the state of [place]. As per [facility] Medical Board telemedicine laws, the patient must reside in the state in which the provider is licensed.   Joylene Grapes, NP 07/01/2023, 4:59 PM [facility]

## 2023-07-08 ENCOUNTER — Telehealth: Payer: Self-pay | Admitting: *Deleted

## 2023-07-08 NOTE — Telephone Encounter (Signed)
Dr. Milinda Antis received surgical clearance forms from [facility]. Per Dr. Milinda Antis pt needs a Surgical Clearance appt. Please schedule when able. thanks

## 2023-07-10 NOTE — Telephone Encounter (Signed)
Spoke to pt, scheduled appt for 07/29/23

## 2023-07-29 ENCOUNTER — Ambulatory Visit: Payer: Medicare HMO | Admitting: Family Medicine

## 2023-07-29 ENCOUNTER — Encounter: Payer: Self-pay | Admitting: Family Medicine

## 2023-07-29 VITALS — BP 132/74 | HR 75 | Temp 98.1°F | Ht 64.5 in | Wt 187.1 lb

## 2023-07-29 DIAGNOSIS — E7800 Pure hypercholesterolemia, unspecified: Secondary | ICD-10-CM

## 2023-07-29 DIAGNOSIS — I10 Essential (primary) hypertension: Secondary | ICD-10-CM

## 2023-07-29 DIAGNOSIS — Z01818 Encounter for other preprocedural examination: Secondary | ICD-10-CM | POA: Diagnosis not present

## 2023-07-29 DIAGNOSIS — R6889 Other general symptoms and signs: Secondary | ICD-10-CM

## 2023-07-29 DIAGNOSIS — R062 Wheezing: Secondary | ICD-10-CM

## 2023-07-29 DIAGNOSIS — R7303 Prediabetes: Secondary | ICD-10-CM | POA: Diagnosis not present

## 2023-07-29 DIAGNOSIS — R0609 Other forms of dyspnea: Secondary | ICD-10-CM | POA: Insufficient documentation

## 2023-07-29 LAB — POCT GLYCOSYLATED HEMOGLOBIN (HGB A1C): Hemoglobin A1C: 5.9 % — AB (ref 4.0–5.6)

## 2023-07-29 MED ORDER — BROMOCRIPTINE MESYLATE 2.5 MG PO TABS: 2.5000 mg | ORAL_TABLET | Freq: Every day | ORAL | 3 refills | Status: DC

## 2023-07-29 MED ORDER — FLUTICASONE PROPIONATE 50 MCG/ACT NA SUSP: 2.0000 | Freq: Every day | NASAL | 3 refills | Status: AC

## 2023-07-29 NOTE — Patient Instructions (Signed)
 Take care of yourself   I put the referral in for a pulmonary evaluation (for shortness of breath on exertion)  Please let us  know if you don't hear in 1-2 weeks to set that up   Do your cardiology virtual visit as planned   Start back to the gym when you can   Lab for A1c today   Try to get most of your carbohydrates from produce (with the exception of white potatoes) and whole grains Eat less bread/pasta/rice/snack foods/cereals/sweets and other items from the middle of the grocery store (processed carbs)

## 2023-07-29 NOTE — Assessment & Plan Note (Signed)
 Shortness of breath on exertion  Neg cardiac work up  Last cxr reviewed   Ref to pulmonary

## 2023-07-29 NOTE — Assessment & Plan Note (Signed)
 This only occurs when sick/had recent uri  Better now   Still c/o shortness of breath on exertion  Neg cardiac work up  Reassuring exam  Will ref to pulmonary for this   Needs clearance for upcoming shoulder surgery in march

## 2023-07-29 NOTE — Assessment & Plan Note (Signed)
 bp in fair control at this time  BP Readings from Last 1 Encounters:  07/29/23 132/74  Pulse of 64  No changes needed Most recent labs reviewed  Disc lifstyle change with low sodium diet and exercise  Doing fine w/o amlodipine   Continues Coreg  12.5 mg bid Chlorthalidone  12.5 mg daily  Lab today

## 2023-07-29 NOTE — Assessment & Plan Note (Signed)
 Lab Results  Component Value Date   HGBA1C 5.9 (A) 07/29/2023   HGBA1C 6.1 11/18/2022   HGBA1C 5.7 12/19/2021   Stable disc imp of low glycemic diet and wt loss to prevent DM2

## 2023-07-29 NOTE — Assessment & Plan Note (Signed)
 Due for lipid panel Disc goals for lipids and reasons to control them Rev last labs with pt Rev low sat fat diet in detail  Plan to continue crestor 10 mg daily  Last LDL of 67  at goal

## 2023-07-29 NOTE — Assessment & Plan Note (Addendum)
 Refer to pulonary for this  Cardiac work up unrevealing Pt does not think this is from deconditioning  Thinks she can walk about a block before resting due to shortness of breath and leg fatigue   Will need clearance before surgery in march for shoulder

## 2023-07-29 NOTE — Assessment & Plan Note (Signed)
 Pending cardiology clearance (normal echo and carotid US) Needs eval from pulmonary for shortness of breath with exertion that pt does not think is from deconditioning   Referral done  Reassuring exam

## 2023-07-29 NOTE — Progress Notes (Signed)
 Subjective:    Patient ID: Nicole Suarez, female    DOB: 1940/08/28, 83 y.o.   MRN: [id number]  HPI  Wt Readings from Last 3 Encounters:  07/29/23 187 lb 2 oz (84.9 kg)  06/09/23 189 lb (85.7 kg)  05/13/23 186 lb 4 oz (84.5 kg)   31.62 kg/m  Vitals:   07/29/23 1050  BP: 132/74  Pulse: 75  Temp: 98.1 F (36.7 C)  SpO2: 99%   Pt presents for medical clearance for surgery  Planning right reverse shoulder arthroplasty with general anesthesia  Planning in march  Has had left side done in the past (did great with it overall)  Dr Franky Pointer   Had a cold/no fever after the holidays  Better now  Just flew back last night  Only wheezes when she is sick  Never had asthma   Sees cardiology Had a visit for exertional dyspnea and near syncopd in may 2024 They reviewed studies from 2019 with no structrual heart abn and no evidence of ischemia  Planned to check carotid us  and echo Normal ech US  carotid some plaque but no stenosis   Per pt shortness of breath with exertion is improved  Pulse ox is 99%  Now goes up and down the stairs normally  Going to the [employer] she is still short of breath enough for something to be wrong more than just being out of shape  Has never seen pulmo doctor before  No wheezing when not sick   Will have appointment with Dr Wonda -virtual   Has neurology appointment in feb  Light headed / ? Dizzy at times  No pain /no headache   Has urology appointment for urinary incontinence with Dr Alvia for overactive bladder   Non smoker miguel  No history of asthma but does wheeze when she gets sick  No OSA  Drug allergies Anaphylaxis with ace inhibitors Rash with aleve     HTN bp is stable today  No cp or palpitations or headaches or edema  No side effects to medicines  BP Readings from Last 3 Encounters:  07/29/23 132/74  06/09/23 138/82  05/13/23 136/86    Coreg  12.5 mg bid  Chlorthalidone  12.5 mg daily   Lab Results   Component Value Date   NA 140 05/13/2023   K 4.6 05/13/2023   CO2 30 05/13/2023   GLUCOSE 97 05/13/2023   BUN 16 05/13/2023   CREATININE 0.79 05/13/2023   CALCIUM  9.9 05/13/2023   GFR 70.81 05/13/2023   GFRNONAA >60 01/22/2022   Lab Results  Component Value Date   ALT 18 05/13/2023   AST 21 05/13/2023   ALKPHOS 62 05/13/2023   BILITOT 0.7 05/13/2023   Lab Results  Component Value Date   WBC 6.5 05/13/2023   HGB 14.1 05/13/2023   HCT 43.3 05/13/2023   MCV 92.1 05/13/2023   PLT 244.0 05/13/2023     Hyperlipidemia Lab Results  Component Value Date   CHOL 147 11/18/2022   HDL 53.80 11/18/2022   LDLCALC 67 11/18/2022   TRIG 132.0 11/18/2022   CHOLHDL 3 11/18/2022   Crestor  10 mg daily  prediabetes Lab Results  Component Value Date   HGBA1C 5.9 (A) 07/29/2023       Patient Active Problem List   Diagnosis Date Noted   DOE (dyspnea on exertion) 07/29/2023   Urinary frequency 06/09/2023   Leg weakness, bilateral 05/13/2023   Left-sided headache 05/13/2023   Wheezing 05/13/2023   Nocturnal  leg cramps 02/19/2023   Hypokalemia 02/19/2023   Mixed incontinence 02/19/2023   Right shoulder pain 01/13/2023   Arm paresthesia, right 11/18/2022   Fatigue 11/17/2022   Impaired exercise tolerance 11/17/2022   Dizziness 10/28/2022   Grief reaction 10/28/2022   Pre-operative clearance 12/19/2021   Current use of proton pump inhibitor 05/24/2021   Low back pain 09/13/2020   Estrogen deficiency 04/26/2020   Prediabetes 04/26/2020   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   GERD (gastroesophageal reflux disease) 07/26/2019   Essential hypertension 07/26/2019   Hyperlipidemia 07/26/2019   Allergic rhinitis 07/26/2019   Urge incontinence 07/26/2019   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   RLS (restless legs syndrome) 07/26/2019   Colon polyp 07/26/2019   Osteopenia 07/26/2019   History of Mohs micrographic surgery for skin cancer  07/26/2019   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    History of urinary incontinence    History of UTI    Hyperlipidemia    Hypertension    Neuromuscular disorder (HCC)    restless legs    PONV (postoperative nausea and vomiting)    Past Surgical History:  Procedure Laterality Date   APPENDECTOMY  1963   CATARACT EXTRACTION, BILATERAL     08/2019, 09/2019   CESAREAN SECTION  1964   MICRODISCECTOMY LUMBAR     L3L4   RECTOCELE REPAIR  2009   REPLACEMENT TOTAL KNEE  2010   REVERSE SHOULDER ARTHROPLASTY Left 01/31/2022   Procedure: REVERSE SHOULDER ARTHROPLASTY;  Surgeon: Melita Drivers, MD;  Location: [facility];  Service: Orthopedics;  Laterality: Left;   ROTATOR CUFF REPAIR  2000   urterine suspension  1971   Social History   Tobacco Use   Smoking status: Never   Smokeless tobacco: Never  Vaping Use   Vaping status: Never Used  Substance Use Topics   Alcohol use: Not Currently    Comment: rare   Drug use: Never   Family History  Problem Relation Age of Onset   Early death Mother    Cancer Father    COPD Father    Lung cancer Father    Pancreatic cancer Sister    Alcohol abuse Brother    Heart disease Brother    Hypertension Brother    COPD Maternal Grandmother    Colon cancer Neg Hx    Esophageal cancer Neg Hx    Stomach cancer Neg Hx    Rectal cancer Neg Hx    Colon polyps Neg Hx    Allergies  Allergen Reactions   Ace Inhibitors Other (See Comments)    THROAT SWELLING   Amlodipine      Fatigue    Requip [Ropinirole]     Vision change    Shrimp [Shellfish Allergy] Nausea And Vomiting    Aleve [Naproxen] Rash   Current Outpatient Medications on File Prior to Visit  Medication Sig Dispense Refill   Calcium  Carbonate-Vit D-Min (CALCIUM  1200 PO) Take 2 capsules by mouth daily.     carvedilol  (COREG ) 12.5 MG tablet TAKE 1 TABLET TWICE A DAY WITH MEALS  180 tablet 3   chlorthalidone  (HYGROTON ) 25 MG tablet TAKE ONE-HALF (1/2) TABLET DAILY 45 tablet 3   cholecalciferol (VITAMIN D3) 25 MCG (1000 UNIT) tablet Take 1,000 Units by mouth daily.     CRANBERRY-VITAMIN C-D MANNOSE PO Take 2 capsules by mouth daily.     estradiol  (ESTRACE  VAGINAL) 0.1 MG/GM vaginal cream Pea sized amount to urethra/vulvar area twice weekly 42.5 g 1   ibuprofen  (ADVIL ) 400 MG tablet Take 400 mg by mouth every 8 (eight) hours as needed.     levocetirizine (XYZAL) 5 MG tablet Take 5 mg by mouth every evening.     Multiple Vitamin (MULTIVITAMIN) capsule Take 1 capsule by mouth daily.     Multiple Vitamins-Minerals (PRESERVISION AREDS 2 PO) Take 2 capsules by mouth daily.     OVER THE COUNTER MEDICATION Turmeric 1000mg  once daily     OVER THE COUNTER MEDICATION D-mannose- 1000 mg daily for bladder health     pantoprazole  (PROTONIX ) 40 MG tablet TAKE 1 TABLET DAILY 90 tablet 1   Polyethyl Glycol-Propyl Glycol (SYSTANE OP) Place 1 drop into both eyes daily as needed (dry eyes).     polyethylene glycol powder (GLYCOLAX /MIRALAX ) 17 GM/SCOOP powder Take 17 g by mouth daily as needed for moderate constipation.     rosuvastatin  (CRESTOR ) 10 MG tablet TAKE 1 TABLET AT BEDTIME 90 tablet 3   senna (SENOKOT) 8.6 MG TABS tablet Take 1-2 tablets by mouth at bedtime as needed for mild constipation.     No current facility-administered medications on file prior to visit.    Review of Systems  Constitutional:  Positive for fatigue. Negative for activity change, appetite change, fever and unexpected weight change.  HENT:  Negative for congestion, ear pain, rhinorrhea, sinus pressure and sore throat.   Eyes:  Negative for  pain, redness and visual disturbance.  Respiratory:  Positive for shortness of breath. Negative for apnea, cough, choking, chest tightness, wheezing and stridor.        Had wheezing with a cold  Resolved now    Cardiovascular:  Negative for chest pain and palpitations.  Gastrointestinal:  Negative for abdominal pain, blood in stool, constipation and diarrhea.  Endocrine: Negative for polydipsia and polyuria.  Genitourinary:  Negative for dysuria, frequency and urgency.  Musculoskeletal:  Positive for arthralgias. Negative for back pain and myalgias.  Skin:  Negative for pallor and rash.  Allergic/Immunologic: Negative for environmental allergies.  Neurological:  Negative for dizziness, syncope and headaches.  Hematological:  Negative for adenopathy. Does not bruise/bleed easily.  Psychiatric/Behavioral:  Negative for decreased concentration and dysphoric mood. The patient is not nervous/anxious.        Objective:   Physical Exam Constitutional:      General: She is not in acute distress.    Appearance: Normal appearance. She is well-developed. She is obese. She is not ill-appearing or diaphoretic.  HENT:     Head: Normocephalic and atraumatic.  Eyes:     Conjunctiva/sclera: Conjunctivae normal.     Pupils: Pupils are equal, round, and reactive to light.  Neck:     Thyroid : No thyromegaly.     Vascular: No carotid bruit or JVD.  Cardiovascular:     Rate and Rhythm: Normal rate and regular rhythm.     Heart sounds: Normal heart sounds.     No gallop.  Pulmonary:     Effort: Pulmonary effort is normal. No respiratory distress.     Breath sounds: Normal breath sounds. No stridor. No wheezing, rhonchi  or rales.     Comments: No shortness of breath with speech  Abdominal:     General: Bowel sounds are normal. There is no distension or abdominal bruit.     Palpations: Abdomen is soft. There is no shifting dullness, hepatomegaly, splenomegaly, mass or pulsatile mass.     Tenderness:  There is no abdominal tenderness. There is no guarding or rebound.     Hernia: No hernia is present.  Musculoskeletal:     Cervical back: Normal range of motion and neck supple.     Right lower leg: No edema.     Left lower leg: No edema.     Comments: Limited rom shoulders   Lymphadenopathy:     Cervical: No cervical adenopathy.  Skin:    General: Skin is warm and dry.     Coloration: Skin is not pale.     Findings: No rash.  Neurological:     Mental Status: She is alert.     Coordination: Coordination normal.     Deep Tendon Reflexes: Reflexes are normal and symmetric. Reflexes normal.  Psychiatric:        Mood and Affect: Mood normal.           Assessment & Plan:   Problem List Items Addressed This Visit       Cardiovascular and Mediastinum   Essential hypertension - Primary   bp in fair control at this time  BP Readings from Last 1 Encounters:  07/29/23 132/74  Pulse of 64  No changes needed Most recent labs reviewed  Disc lifstyle change with low sodium diet and exercise  Doing fine w/o amlodipine   Continues Coreg  12.5 mg bid Chlorthalidone  12.5 mg daily  Lab today        Other   Wheezing   This only occurs when sick/had recent uri  Better now   Still c/o shortness of breath on exertion  Neg cardiac work up  Reassuring exam  Will ref to pulmonary for this   Needs clearance for upcoming shoulder surgery in march      Prediabetes   Lab Results  Component Value Date   HGBA1C 5.9 (A) 07/29/2023   HGBA1C 6.1 11/18/2022   HGBA1C 5.7 12/19/2021   Stable disc imp of low glycemic diet and wt loss to prevent DM2       Relevant Orders   POCT glycosylated hemoglobin (Hb A1C) (Completed)   Pre-operative clearance   Pending cardiology clearance (normal echo and carotid us ) Needs eval from pulmonary for shortness of breath with exertion that pt does not think is from deconditioning   Referral done  Reassuring exam       Relevant Orders    Ambulatory referral to Pulmonology   Impaired exercise tolerance   Shortness of breath on exertion  Neg cardiac work up  Last cxr reviewed   Ref to pulmonary       Relevant Orders   Ambulatory referral to Pulmonology   Hyperlipidemia   Due for lipid panel Disc goals for lipids and reasons to control them Rev last labs with pt Rev low sat fat diet in detail  Plan to continue crestor  10 mg daily  Last LDL of 67  at goal       DOE (dyspnea on exertion)   Refer to pulonary for this  Cardiac work up unrevealing Pt does not think this is from deconditioning  Thinks she can walk about a block before resting due to shortness of breath and  leg fatigue   Will need clearance before surgery in march for shoulder        Relevant Orders   Ambulatory referral to Pulmonology

## 2023-08-11 ENCOUNTER — Encounter: Payer: Self-pay | Admitting: Student in an Organized Health Care Education/Training Program

## 2023-08-11 ENCOUNTER — Ambulatory Visit: Payer: Medicare HMO | Admitting: Student in an Organized Health Care Education/Training Program

## 2023-08-11 VITALS — BP 136/76 | HR 78 | Temp 97.3°F | Ht 64.5 in | Wt 183.4 lb

## 2023-08-11 DIAGNOSIS — Z01818 Encounter for other preprocedural examination: Secondary | ICD-10-CM

## 2023-08-11 NOTE — Progress Notes (Signed)
Assessment & Plan:   1. Preoperative Risk Assessment  Presenting for preoperative risk assessment prior to shoulder replacement surgery.  Oxygen saturation is normal today, and lungs are clear on exam and history is essentially unremarkable outside of an upper respiratory tract infection around the holidays.  She scores only 6 points and is class I/very low risk on the [place] respiratory failure index (this is extrapolated from adult males).  Similarly, she scored 16 points (by virtue of age) on the ARISCAT index again putting her at low risk of pulmonary complications.  Patient is overall optimized from a pulmonary perspective and no further testing is necessary prior to her planned surgical intervention.   Return if symptoms worsen or fail to improve.  I spent 46 minutes caring for this patient today, including preparing to see the patient, obtaining a medical history , reviewing a separately obtained history, performing a medically appropriate examination and/or evaluation, counseling and educating the patient/family/caregiver, documenting clinical information in the electronic health record, and independently interpreting results (not separately reported/billed) and communicating results to the patient/family/caregiver  Raechel Chute, MD [facility] 08/11/2023 12:16 PM    End of visit medications:  No orders of the defined types were placed in this encounter.    Current Outpatient Medications:    bromocriptine (PARLODEL) 2.5 MG tablet, Take 1 tablet (2.5 mg total) by mouth daily., Disp: 90 tablet, Rfl: 3   Calcium Carbonate-Vit D-Min (CALCIUM 1200 PO), Take 2 capsules by mouth daily., Disp: , Rfl:    carvedilol (COREG) 12.5 MG tablet, TAKE 1 TABLET TWICE A DAY WITH MEALS, Disp: 180 tablet, Rfl: 3   chlorthalidone (HYGROTON) 25 MG tablet, TAKE ONE-HALF (1/2) TABLET DAILY, Disp: 45 tablet, Rfl: 3   cholecalciferol (VITAMIN D3) 25 MCG (1000 UNIT) tablet, Take  1,000 Units by mouth daily., Disp: , Rfl:    CRANBERRY-VITAMIN C-D MANNOSE PO, Take 2 capsules by mouth daily., Disp: , Rfl:    estradiol (ESTRACE VAGINAL) 0.1 MG/GM vaginal cream, Pea sized amount to urethra/vulvar area twice weekly, Disp: 42.5 g, Rfl: 1   fluticasone (FLONASE) 50 MCG/ACT nasal spray, Place 2 sprays into both nostrils daily., Disp: 48 g, Rfl: 3   ibuprofen (ADVIL) 400 MG tablet, Take 400 mg by mouth every 8 (eight) hours as needed., Disp: , Rfl:    levocetirizine (XYZAL) 5 MG tablet, Take 5 mg by mouth every evening., Disp: , Rfl:    Multiple Vitamin (MULTIVITAMIN) capsule, Take 1 capsule by mouth daily., Disp: , Rfl:    Multiple Vitamins-Minerals (PRESERVISION AREDS 2 PO), Take 2 capsules by mouth daily., Disp: , Rfl:    OVER THE COUNTER MEDICATION, Turmeric 1000mg  once daily, Disp: , Rfl:    OVER THE COUNTER MEDICATION, D-mannose- 1000 mg daily for bladder health, Disp: , Rfl:    pantoprazole (PROTONIX) 40 MG tablet, TAKE 1 TABLET DAILY, Disp: 90 tablet, Rfl: 1   Polyethyl Glycol-Propyl Glycol (SYSTANE OP), Place 1 drop into both eyes daily as needed (dry eyes)., Disp: , Rfl:    polyethylene glycol powder (GLYCOLAX/MIRALAX) 17 GM/SCOOP powder, Take 17 g by mouth daily as needed for moderate constipation., Disp: , Rfl:    rosuvastatin (CRESTOR) 10 MG tablet, TAKE 1 TABLET AT BEDTIME, Disp: 90 tablet, Rfl: 3   senna (SENOKOT) 8.6 MG TABS tablet, Take 1-2 tablets by mouth at bedtime as needed for mild constipation., Disp: , Rfl:    Subjective:   PATIENT ID: Nicole Suarez GENDER: female DOB: Apr 27, 1941, MRN:  [id number]  Chief Complaint  Patient presents with   Consult    Shortness of breath on exertion. She reports breathing is better. Is scheduled for right shoulder surgery in March.     HPI  Patient is a pleasant 83 year old female with history of hypertension presenting to clinic for preoperative risk assessment prior to shoulder replacement surgery.  Patient reports  being in her usual state of health and denies any respiratory symptoms at this point.  She feels limited in her ability to walk long distances secondary to knee pain.  She does not have any shortness of breath except with significant exertion.  She currently denies any cough, wheezing, chest pain, chest tightness, sputum production, fevers, chills, or night sweats.  Patient did visit her family in [place] for the holidays and reports having had an upper respiratory tract infection at that time with associated cough that self resolved.  She tells me that her daughter thought she might have had a wheeze but the patient did not think that was the case.  Outside of that, she has never had any respiratory illnesses.  She specifically denies any history of asthma.  Patient [place] 89.  She denies any smoking history but does report some secondhand smoke exposure as a child.  She previously worked as a Designer, jewellery.  Ancillary information including prior medications, full medical/surgical/family/social histories, and PFTs (when available) are listed below and have been reviewed.   Review of Systems  Constitutional:  Negative for chills, fever, malaise/fatigue and weight loss.  Respiratory:  Negative for cough, hemoptysis, sputum production, shortness of breath and wheezing.   Cardiovascular:  Negative for chest pain, palpitations, orthopnea and claudication.     Objective:   Vitals:   08/11/23 1123  BP: 136/76  Pulse: 78  Temp: (!) 97.3 F (36.3 C)  TempSrc: Temporal  SpO2: 97%  Weight: 183 lb 6.4 oz (83.2 kg)  Height: 5' 4.5" (1.638 m)   97% on RA BMI Readings from Last 3 Encounters:  08/11/23 30.99 kg/m  07/29/23 31.62 kg/m  06/09/23 31.94 kg/m   Wt Readings from Last 3 Encounters:  08/11/23 183 lb 6.4 oz (83.2 kg)  07/29/23 187 lb 2 oz (84.9 kg)  06/09/23 189 lb (85.7 kg)    Physical Exam Constitutional:       Appearance: Normal appearance. She is obese.  Cardiovascular:     Rate and Rhythm: Normal rate and regular rhythm.     Pulses: Normal pulses.     Heart sounds: Normal heart sounds.  Pulmonary:     Effort: Pulmonary effort is normal.     Breath sounds: Normal breath sounds. No wheezing, rhonchi or rales.  Neurological:     General: No focal deficit present.     Mental Status: She is alert and oriented to person, place, and time. Mental status is at baseline.       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    History of  urinary incontinence    History of UTI    Hyperlipidemia    Hypertension    Neuromuscular disorder (HCC)    restless legs    PONV (postoperative nausea and vomiting)      Family History  Problem Relation Age of Onset   Early death Mother    Cancer Father    COPD Father    Lung cancer Father    Pancreatic cancer Sister    Alcohol abuse Brother    Heart disease Brother    Hypertension Brother    COPD Maternal Grandmother    Colon cancer Neg Hx    Esophageal cancer Neg Hx    Stomach cancer Neg Hx    Rectal cancer Neg Hx    Colon polyps Neg Hx      Past Surgical History:  Procedure Laterality Date   APPENDECTOMY  1963   CATARACT EXTRACTION, BILATERAL     08/2019, 09/2019   CESAREAN SECTION  1964   MICRODISCECTOMY LUMBAR     L3L4   RECTOCELE REPAIR  2009   REPLACEMENT TOTAL KNEE  2010   REVERSE SHOULDER ARTHROPLASTY Left 01/31/2022   Procedure: REVERSE SHOULDER ARTHROPLASTY;  Surgeon: Francena Hanly, MD;  Location: [facility];  Service: Orthopedics;  Laterality:  Left;   ROTATOR CUFF REPAIR  2000   urterine suspension  1971    Social History   Socioeconomic History   Marital status: Widowed    Spouse name: Not on file   Number of children: Not on file   Years of education: Not on file   Highest education level: Associate degree: occupational, Scientist, product/process development, or vocational program  Occupational History   Occupation: retired Charity fundraiser  Tobacco Use   Smoking status: Never   Smokeless tobacco: Never  Vaping Use   Vaping status: Never Used  Substance and Sexual Activity   Alcohol use: Not Currently    Comment: rare   Drug use: Never   Sexual activity: Not on file  Other Topics Concern   Not on file  Social History Narrative   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    Moved her from [place] in 2020   Cares for disabled husband   Lives in [place]    Social Drivers of Health   Financial Resource Strain: Low Risk  (07/28/2023)   Overall Financial Resource Strain (CARDIA)    Difficulty of Paying Living Expenses: Not very hard  Food Insecurity: Low Risk  (07/31/2023)   Received from [facility]   Hunger Vital Sign    Worried About Running Out of Food in the Last Year: Never true    Ran Out of Food in the Last Year: Never true  Transportation Needs: No Transportation Needs (07/31/2023)   Received from [facility]    In the past 12 months, has lack of reliable transportation kept you from medical appointments, meetings, work or from getting things needed for daily living? : No  Physical Activity: Inactive (07/28/2023)   Exercise Vital Sign    Days of Exercise per Week: 0 days    Minutes of Exercise per Session: 10 min  Stress: No Stress Concern Present (07/28/2023)   [employer] of Occupational Health - Occupational Stress Questionnaire    Feeling of Stress : Not at all  Social Connections: Moderately Integrated (07/28/2023)   Social Connection and Isolation Panel [NHANES]    Frequency of Communication with Friends and Family:  Three times a week    Frequency of  Social Gatherings with Friends and Family: Twice a week    Attends Religious Services: More than 4 times per year    Active Member of [employer] or Organizations: Yes    Attends Banker Meetings: More than 4 times per year    Marital Status: Widowed  Intimate Partner Violence: Not At Risk (05/30/2021)   Humiliation, Afraid, Rape, and Kick questionnaire    Fear of Current or Ex-Partner: No    Emotionally Abused: No    Physically Abused: No    Sexually Abused: No     Allergies  Allergen Reactions   Ace Inhibitors Other (See Comments)    THROAT SWELLING   Amlodipine     Fatigue    Requip [Ropinirole]     Vision change    Shrimp [Shellfish Allergy] Nausea And Vomiting   Aleve [Naproxen] Rash     CBC    Component Value Date/Time   WBC 6.5 05/13/2023 1107   RBC 4.71 05/13/2023 1107   HGB 14.1 05/13/2023 1107   HCT 43.3 05/13/2023 1107   PLT 244.0 05/13/2023 1107   MCV 92.1 05/13/2023 1107   MCH 30.5 01/22/2022 1128   MCHC 32.5 05/13/2023 1107   RDW 13.7 05/13/2023 1107   LYMPHSABS 1.6 05/13/2023 1107   MONOABS 0.6 05/13/2023 1107   EOSABS 0.1 05/13/2023 1107   BASOSABS 0.0 05/13/2023 1107    Pulmonary Functions Testing Results:     No data to display          Outpatient Medications Prior to Visit  Medication Sig Dispense Refill   bromocriptine (PARLODEL) 2.5 MG tablet Take 1 tablet (2.5 mg total) by mouth daily. 90 tablet 3   Calcium Carbonate-Vit D-Min (CALCIUM 1200 PO) Take 2 capsules by mouth daily.     carvedilol (COREG) 12.5 MG tablet TAKE 1 TABLET TWICE A DAY WITH MEALS 180 tablet 3   chlorthalidone (HYGROTON) 25 MG tablet TAKE ONE-HALF (1/2) TABLET DAILY 45 tablet 3   cholecalciferol (VITAMIN D3) 25 MCG (1000 UNIT) tablet Take 1,000 Units by mouth daily.     CRANBERRY-VITAMIN C-D MANNOSE PO Take 2 capsules by mouth daily.     estradiol (ESTRACE VAGINAL) 0.1 MG/GM vaginal cream Pea sized amount to urethra/vulvar  area twice weekly 42.5 g 1   fluticasone (FLONASE) 50 MCG/ACT nasal spray Place 2 sprays into both nostrils daily. 48 g 3   ibuprofen (ADVIL) 400 MG tablet Take 400 mg by mouth every 8 (eight) hours as needed.     levocetirizine (XYZAL) 5 MG tablet Take 5 mg by mouth every evening.     Multiple Vitamin (MULTIVITAMIN) capsule Take 1 capsule by mouth daily.     Multiple Vitamins-Minerals (PRESERVISION AREDS 2 PO) Take 2 capsules by mouth daily.     OVER THE COUNTER MEDICATION Turmeric 1000mg  once daily     OVER THE COUNTER MEDICATION D-mannose- 1000 mg daily for bladder health     pantoprazole (PROTONIX) 40 MG tablet TAKE 1 TABLET DAILY 90 tablet 1   Polyethyl Glycol-Propyl Glycol (SYSTANE OP) Place 1 drop into both eyes daily as needed (dry eyes).     polyethylene glycol powder (GLYCOLAX/MIRALAX) 17 GM/SCOOP powder Take 17 g by mouth daily as needed for moderate constipation.     rosuvastatin (CRESTOR) 10 MG tablet TAKE 1 TABLET AT BEDTIME 90 tablet 3   senna (SENOKOT) 8.6 MG TABS tablet Take 1-2 tablets by mouth at bedtime as needed for mild constipation.     No facility-administered medications  prior to visit.

## 2023-08-15 ENCOUNTER — Ambulatory Visit: Payer: Medicare HMO | Attending: Cardiology

## 2023-08-15 DIAGNOSIS — Z01810 Encounter for preprocedural cardiovascular examination: Secondary | ICD-10-CM

## 2023-08-15 NOTE — Progress Notes (Signed)
Virtual Visit via Telephone Note   Because of Nicole Suarez's co-morbid illnesses, she is at least at moderate risk for complications without adequate follow up.  This format is felt to be most appropriate for this patient at this time.  The patient did not have access to video technology/had technical difficulties with video requiring transitioning to audio format only (telephone).  All issues noted in this document were discussed and addressed.  No physical exam could be performed with this format.  Please refer to the patient's chart for her consent to telehealth for [facility].  Evaluation Performed:  Preoperative cardiovascular risk assessment _____________   Date:  08/15/2023   Patient ID:  Nicole Suarez, Nicole Suarez Apr 05, 1941, MRN [id number] Patient Location:  Home Provider location:   Office  Primary Care Provider:  Judy Pimple, MD Primary Cardiologist:  None  Chief Complaint / Patient Profile   83 y.o. y/o female with a h/o hyperlipidemia, hypertension, near syncope, exertional dyspnea who is pending right reverse shoulder arthroplasty and presents today for telephonic preoperative cardiovascular risk assessment.  History of Present Illness    Nicole Suarez is a 83 y.o. female who presents via audio/video conferencing for a telehealth visit today.  Pt was last seen in cardiology clinic on 11/22/2022 by Dr. Excell Seltzer.  At that time Jernee Murtaugh was doing well .  The patient is now pending procedure as outlined above. Since her last visit, she remained stable from a cardiac standpoint.  Today she denies chest pain, shortness of breath, lower extremity edema, fatigue, palpitations, melena, hematuria, hemoptysis, diaphoresis, weakness, presyncope, syncope, orthopnea, and PND.   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    History of urinary incontinence    History of UTI    Hyperlipidemia    Hypertension    Neuromuscular disorder (HCC)    restless legs    PONV (postoperative nausea and vomiting)    Past Surgical History:  Procedure Laterality Date   APPENDECTOMY  1963   CATARACT EXTRACTION, BILATERAL     08/2019, 09/2019   CESAREAN SECTION  1964   MICRODISCECTOMY LUMBAR     L3L4   RECTOCELE REPAIR  2009   REPLACEMENT TOTAL KNEE  2010   REVERSE SHOULDER ARTHROPLASTY Left 01/31/2022   Procedure: REVERSE SHOULDER ARTHROPLASTY;  Surgeon: Francena Hanly, MD;  Location: [facility];  Service: Orthopedics;  Laterality: Left;   ROTATOR CUFF REPAIR  2000   urterine suspension  1971    Allergies  Allergies  Allergen Reactions   Ace Inhibitors Other (See Comments)    THROAT SWELLING   Amlodipine     Fatigue    Requip [Ropinirole]     Vision change    Shrimp [Shellfish Allergy] Nausea And Vomiting   Aleve [Naproxen] Rash    Home Medications    Prior to Admission medications   Medication Sig Start Date End Date Taking? Authorizing Provider  bromocriptine (PARLODEL) 2.5 MG tablet Take 1 tablet (2.5 mg total) by mouth daily. 07/29/23   Tower, Audrie Gallus, MD  Calcium Carbonate-Vit D-Min (CALCIUM 1200 PO) Take 2 capsules by mouth daily.    [provider]  carvedilol (COREG) 12.5 MG tablet TAKE 1 TABLET TWICE A DAY WITH MEALS 06/10/23   Tower, Audrie Gallus, MD  chlorthalidone (HYGROTON) 25 MG tablet TAKE ONE-HALF (1/2) TABLET DAILY 05/30/23   Tower, Audrie Gallus, MD  cholecalciferol (VITAMIN D3) 25 MCG (1000 UNIT) tablet Take 1,000 Units by mouth daily.    [provider]  CRANBERRY-VITAMIN C-D MANNOSE PO Take 2 capsules by mouth daily.    [provider]   estradiol (ESTRACE VAGINAL) 0.1 MG/GM vaginal cream Pea sized amount to urethra/vulvar area twice weekly 02/19/23   Tower, Audrie Gallus, MD  fluticasone [facility]) 50 MCG/ACT nasal spray Place 2 sprays into both nostrils daily. 07/29/23   Tower, Audrie Gallus, MD  ibuprofen (ADVIL) 400 MG tablet Take 400 mg by mouth every 8 (eight) hours as needed.    [provider]  levocetirizine (XYZAL) 5 MG tablet Take 5 mg by mouth every evening.    [provider]  Multiple Vitamin (MULTIVITAMIN) capsule Take 1 capsule by mouth daily.    [provider]  Multiple Vitamins-Minerals (PRESERVISION AREDS 2 PO) Take 2 capsules by mouth daily.    [provider]  OVER THE COUNTER MEDICATION Turmeric 1000mg  once daily    [provider]  OVER THE COUNTER MEDICATION D-mannose- 1000 mg daily for bladder health    [provider]  pantoprazole (PROTONIX) 40 MG tablet TAKE 1 TABLET DAILY 05/06/23   Tower, Audrie Gallus, MD  Polyethyl Glycol-Propyl Glycol (SYSTANE OP) Place 1 drop into both eyes daily as needed (dry eyes).    [provider]  polyethylene glycol powder (GLYCOLAX/MIRALAX) 17 GM/SCOOP powder Take 17 g by mouth daily as needed for moderate constipation.    [provider]  rosuvastatin (CRESTOR) 10 MG tablet TAKE 1 TABLET AT BEDTIME 06/10/23   Tower, Audrie Gallus, MD  senna (SENOKOT) 8.6 MG TABS tablet Take 1-2 tablets by mouth at bedtime as needed for mild constipation.    [provider]    Physical Exam    Vital Signs:  Quinteria Corcino does not have vital signs available for review today.  Given telephonic nature of communication, physical exam is limited. AAOx3. NAD. Normal affect.  Speech and respirations are unlabored.  Accessory Clinical Findings    None  Assessment & Plan    1.  Preoperative Cardiovascular Risk Assessment:right reverse shoulder arthroplasty   Date of Surgery:  Clearance TBD                                 Surgeon:   Dr. Francena Hanly Surgeon's Group or Practice Name:  [facility] Fax number:  [phone]      Primary Cardiologist: None  Chart reviewed as part of pre-operative protocol coverage. Given past medical history and time since last visit, based on ACC/AHA guidelines, Jasneet Garis would be at acceptable risk for the planned procedure without further cardiovascular testing.   Her RCRI is very low risk, 0.4% risk of major cardiac event.  She is able to complete greater than 4 METS of physical activity.  Patient was advised that if she develops new symptoms prior to surgery to contact our office to arrange a follow-up appointment.  He verbalized understanding.  I will route this recommendation to the requesting party via Epic fax function and remove from pre-op pool.  Time:   Today, I have spent 7 minutes with the patient with telehealth technology discussing medical history, symptoms, and management plan.     Ronney Asters, NP  08/15/2023, 7:10 AM

## 2023-08-19 ENCOUNTER — Telehealth: Payer: Self-pay | Admitting: Cardiovascular Disease

## 2023-08-19 NOTE — Telephone Encounter (Signed)
I will fax notes to Dr.Supple and  his surgery scheduler.

## 2023-08-19 NOTE — Telephone Encounter (Signed)
Will forward to pre op APP to review if the pt has been cleared.

## 2023-08-19 NOTE — Telephone Encounter (Signed)
Patient called to follow-up on the status of her clearance.  Patient stated the clearance had not yet been sent to her doctor and they will need the clearance before they schedule her surgery.

## 2023-08-19 NOTE — Telephone Encounter (Signed)
 Preoperative team, please resend my note to requesting office.  Note was directly sent to Dr. Franky Pointer previously.  Patient had preoperative telephone visit on 08/15/2023.  Thank you for your help.  Nicole Suarez. Gwenith Tschida NP-C     08/19/2023, 4:22 PM [facility] [address] Office [phone] Fax [phone]

## 2023-08-25 ENCOUNTER — Encounter: Payer: Medicare HMO | Admitting: Dermatology

## 2023-08-27 ENCOUNTER — Encounter: Payer: Medicare HMO | Admitting: Dermatology

## 2023-08-28 ENCOUNTER — Ambulatory Visit: Payer: Medicare HMO | Admitting: Neurology

## 2023-08-28 ENCOUNTER — Encounter: Payer: Self-pay | Admitting: Neurology

## 2023-08-28 VITALS — BP 144/84 | HR 75 | Ht 64.5 in | Wt 188.0 lb

## 2023-08-28 DIAGNOSIS — R519 Headache, unspecified: Secondary | ICD-10-CM

## 2023-08-28 NOTE — Progress Notes (Signed)
[facility]  PATIENT: Nicole Suarez DOB: 10-Apr-1941  REQUESTING CLINICIAN: Tower, Audrie Gallus, MD HISTORY FROM: Patient  REASON FOR VISIT: Headaches    HISTORICAL  CHIEF COMPLAINT:  Chief Complaint  Patient presents with   New Patient (Initial Visit)    Pt in 12, here alone Pt is referred by Dr Roxy Manns for left sided headaches and leg weakness. Pt states both legs feel weak, more so on the left.     HISTORY OF PRESENT ILLNESS:  This is a 83 year old woman past medical history hypertension, hyperlipidemia, history of back surgery, who is presenting with complaints of headache.  Patient reports back in the summer, she fell off the back of a golf cart and hit her head, on the left side.  She did have a head CT at that time which was negative for any acute abnormality.  She reports experiencing left sided headache but these headaches have improved.  Right now she will have on occasion maybe once a month or once every 2 weeks a sharp shooting pain on left side of her head lasting less than 5-minutes.  It is not associated with any other symptoms, no blurry vision, no double vision, and it is in the ophthalmic distribution on the left side. She denies any rash, denies any history of chicken pox or shingles. She has not been taking any medication for this type of headache.  Overall she tells me that her headache frequency and severity has decreased since the fall in the summer.   OTHER MEDICAL CONDITIONS: Hypertension, Hyperlipidemia, History of back surgery,    REVIEW OF SYSTEMS: Full 14 system review of systems performed and negative with exception of: As noted in the HPI   ALLERGIES: Allergies  Allergen Reactions   Ace Inhibitors Other (See Comments)    THROAT SWELLING   Amlodipine     Fatigue    Requip [Ropinirole]     Vision change    Shrimp [Shellfish Allergy] Nausea And Vomiting   Aleve [Naproxen] Rash    HOME MEDICATIONS: Outpatient Medications Prior  to Visit  Medication Sig Dispense Refill   bromocriptine (PARLODEL) 2.5 MG tablet Take 1 tablet (2.5 mg total) by mouth daily. 90 tablet 3   Calcium Carbonate-Vit D-Min (CALCIUM 1200 PO) Take 2 capsules by mouth daily.     carvedilol (COREG) 12.5 MG tablet TAKE 1 TABLET TWICE A DAY WITH MEALS 180 tablet 3   chlorthalidone (HYGROTON) 25 MG tablet TAKE ONE-HALF (1/2) TABLET DAILY 45 tablet 3   cholecalciferol (VITAMIN D3) 25 MCG (1000 UNIT) tablet Take 1,000 Units by mouth daily.     CRANBERRY-VITAMIN C-D MANNOSE PO Take 2 capsules by mouth daily.     estradiol (ESTRACE VAGINAL) 0.1 MG/GM vaginal cream Pea sized amount to urethra/vulvar area twice weekly 42.5 g 1   fluticasone (FLONASE) 50 MCG/ACT nasal spray Place 2 sprays into both nostrils daily. 48 g 3   ibuprofen (ADVIL) 400 MG tablet Take 400 mg by mouth every 8 (eight) hours as needed.     levocetirizine (XYZAL) 5 MG tablet Take 5 mg by mouth every evening.     Magnesium 250 MG CAPS Take by mouth.     Multiple Vitamin (MULTIVITAMIN) capsule Take 1 capsule by mouth daily.     Multiple Vitamins-Minerals (PRESERVISION AREDS 2 PO) Take 2 capsules by mouth daily.     OVER THE COUNTER MEDICATION Turmeric 1000mg  once daily     OVER THE COUNTER MEDICATION D-mannose- 1000 mg daily  for bladder health     pantoprazole (PROTONIX) 40 MG tablet TAKE 1 TABLET DAILY 90 tablet 1   Polyethyl Glycol-Propyl Glycol (SYSTANE OP) Place 1 drop into both eyes daily as needed (dry eyes).     polyethylene glycol powder (GLYCOLAX/MIRALAX) 17 GM/SCOOP powder Take 17 g by mouth daily as needed for moderate constipation.     rosuvastatin (CRESTOR) 10 MG tablet TAKE 1 TABLET AT BEDTIME 90 tablet 3   senna (SENOKOT) 8.6 MG TABS tablet Take 1-2 tablets by mouth at bedtime as needed for mild constipation.     No facility-administered medications prior to visit.    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    History of urinary incontinence    History of UTI    Hyperlipidemia    Hypertension    Neuromuscular disorder (HCC)    restless legs    PONV (postoperative nausea and vomiting)     PAST SURGICAL HISTORY: Past Surgical History:  Procedure Laterality Date   APPENDECTOMY  1963   CATARACT EXTRACTION, BILATERAL     08/2019, 09/2019   CESAREAN SECTION  1964   MICRODISCECTOMY LUMBAR     L3L4   RECTOCELE REPAIR  2009   REPLACEMENT TOTAL KNEE  2010   REVERSE SHOULDER ARTHROPLASTY Left 01/31/2022   Procedure: REVERSE SHOULDER ARTHROPLASTY;  Surgeon: Francena Hanly, MD;  Location: [facility];  Service: Orthopedics;  Laterality: Left;   ROTATOR CUFF REPAIR  2000   urterine suspension  1971    FAMILY HISTORY: Family History  Problem Relation Age of Onset   Early death Mother    Cancer Father    COPD Father    Lung cancer Father    Pancreatic cancer Sister    Alcohol abuse Brother    Heart disease Brother    Hypertension Brother    COPD Maternal Grandmother    Colon cancer Neg Hx    Esophageal cancer Neg Hx    Stomach cancer Neg Hx    Rectal cancer Neg Hx    Colon polyps Neg Hx     SOCIAL HISTORY: Social History   Socioeconomic History   Marital status: Widowed    Spouse name: Not on file   Number of children: Not on file   Years of education: Not on file   Highest education level: Associate degree: occupational, Scientist, product/process development, or vocational program  Occupational History   Occupation: retired Charity fundraiser  Tobacco Use   Smoking status: Never   Smokeless tobacco: Never  Vaping Use   Vaping status: Never Used  Substance and Sexual Activity   Alcohol use: Not  Currently    Comment: rare   Drug use: Never   Sexual activity: Not on file  Other Topics Concern   Not on file  Social History Narrative   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    Moved her from [place] in 2020   Cares for disabled husband   Lives in [place]    Social Drivers of Health   Financial Resource Strain: Low Risk  (  07/28/2023)   Overall Financial Resource Strain (CARDIA)    Difficulty of Paying Living Expenses: Not very hard  Food Insecurity: Low Risk  (07/31/2023)   Received from [facility]   Hunger Vital Sign    Worried About Running Out of Food in the Last Year: Never true    Ran Out of Food in the Last Year: Never true  Transportation Needs: No Transportation Needs (07/31/2023)   Received from [facility]    In the past 12 months, has lack of reliable transportation kept you from medical appointments, meetings, work or from getting things needed for daily living? : No  Physical Activity: Inactive (07/28/2023)   Exercise Vital Sign    Days of Exercise per Week: 0 days    Minutes of Exercise per Session: 10 min  Stress: No Stress Concern Present (07/28/2023)   [employer] of Occupational Health - Occupational Stress Questionnaire    Feeling of Stress : Not at all  Social Connections: Moderately Integrated (07/28/2023)   Social Connection and Isolation Panel [NHANES]    Frequency of Communication with Friends and Family: Three times a week    Frequency of Social Gatherings with Friends and Family: Twice a week    Attends Religious Services: More than 4 times per year    Active Member of [employer] or Organizations: Yes    Attends Banker Meetings: More than 4 times per year    Marital Status: Widowed  Intimate Partner Violence: Not At Risk (05/30/2021)   Humiliation, Afraid, Rape, and Kick questionnaire    Fear of Current or Ex-Partner: No    Emotionally Abused: No    Physically Abused: No    Sexually Abused: No    PHYSICAL  EXAM  GENERAL EXAM/CONSTITUTIONAL: Vitals:  Vitals:   08/28/23 1426  BP: (!) 144/84  Pulse: 75  Weight: 188 lb (85.3 kg)  Height: 5' 4.5" (1.638 m)   Body mass index is 31.77 kg/m. Wt Readings from Last 3 Encounters:  08/28/23 188 lb (85.3 kg)  08/11/23 183 lb 6.4 oz (83.2 kg)  07/29/23 187 lb 2 oz (84.9 kg)   Patient is in no distress; well developed, nourished and groomed; neck is supple  MUSCULOSKELETAL: Gait, strength, tone, movements noted in Neurologic exam below  NEUROLOGIC: MENTAL STATUS:     05/26/2020    9:54 AM  MMSE - Mini Mental State Exam  Orientation to time 5  Orientation to Place 5  Registration 3  Attention/ Calculation 5  Recall 3  Language- repeat 1   awake, alert, oriented to person, place and time recent and remote memory intact normal attention and concentration language fluent, comprehension intact, naming intact fund of knowledge appropriate  CRANIAL NERVE:  2nd, 3rd, 4th, 6th - Visual fields full to confrontation, extraocular muscles intact, no nystagmus 5th - facial sensation symmetric 7th - facial strength symmetric 8th - hearing intact 9th - palate elevates symmetrically, uvula midline 11th - shoulder shrug symmetric 12th - tongue protrusion midline  MOTOR:  normal bulk and tone, full strength in the BUE, BLE  SENSORY:  normal and symmetric to light touch  COORDINATION:  finger-nose-finger, fine finger movements normal  GAIT/STATION:  normal   DIAGNOSTIC DATA (LABS, IMAGING, TESTING) - I reviewed patient records, labs, notes, testing and imaging myself where available.  Lab Results  Component Value Date   WBC 6.5 05/13/2023   HGB 14.1 05/13/2023   HCT 43.3 05/13/2023   MCV 92.1 05/13/2023  PLT 244.0 05/13/2023      Component Value Date/Time   NA 140 05/13/2023 1107   K 4.6 05/13/2023 1107   CL 101 05/13/2023 1107   CO2 30 05/13/2023 1107   GLUCOSE 97 05/13/2023 1107   BUN 16 05/13/2023 1107   CREATININE  0.79 05/13/2023 1107   CALCIUM 9.9 05/13/2023 1107   PROT 6.6 05/13/2023 1107   ALBUMIN 4.3 05/13/2023 1107   AST 21 05/13/2023 1107   ALT 18 05/13/2023 1107   ALKPHOS 62 05/13/2023 1107   BILITOT 0.7 05/13/2023 1107   GFRNONAA >60 01/22/2022 1128   Lab Results  Component Value Date   CHOL 147 11/18/2022   HDL 53.80 11/18/2022   LDLCALC 67 11/18/2022   TRIG 132.0 11/18/2022   CHOLHDL 3 11/18/2022   Lab Results  Component Value Date   HGBA1C 5.9 (A) 07/29/2023   Lab Results  Component Value Date   VITAMINB12 463 05/13/2023   Lab Results  Component Value Date   TSH 1.23 05/13/2023    MRI Brain 11/26/2022 1. On dynamic and postcontrast imaging, there is a somewhat hypoenhancing area in the posterior left greater than right aspect of the sella, which is only seen on the sagittal images. 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 No evidence of mass effect on surrounding structures or deviation of the infundibulum. 2. No acute intracranial process.   Head CT 01/13/2023 No evidence of acute intracranial abnormality.    ASSESSMENT AND PLAN  83 y.o. year old female with history of hypertension, hyperlipidemia, pituitary adenoma, back surgery who is presenting with complaint of headaches since her fall off a golf cart in the summer.  She tells me that her headache frequency and severity has improved, currently she is having sharp left-sided headache lasting less than  5-minutes once every 2 weeks to once a month.  Based on the duration and frequency of these headaches, I do not think that she needs to be on the medication.  Advised her to continue her current medication, continue with physical therapy, advised patient to work on her balance and gait and to return as needed.  She voiced understanding.   1. Left-sided headache      Patient Instructions  Continue current medications  Based on duration and frequency of these  headaches, will not recommend medications  Continue with physical therapy  Return as needed   No orders of the defined types were placed in this encounter.   No orders of the defined types were placed in this encounter.   Return if symptoms worsen or fail to improve.    Windell Norfolk, MD 08/28/2023, 4:53 PM  [facility] [address], [address] [place], [place] [address] [phone]

## 2023-08-28 NOTE — Patient Instructions (Signed)
Continue current medications  Based on duration and frequency of these headaches, will not recommend medications  Continue with physical therapy  Return as needed

## 2023-09-02 ENCOUNTER — Ambulatory Visit: Payer: Medicare HMO | Admitting: Diagnostic Neuroimaging

## 2023-09-03 ENCOUNTER — Encounter: Payer: Medicare HMO | Admitting: Dermatology

## 2023-09-09 NOTE — Progress Notes (Addendum)
 Anesthesia Review:  PCP: Roxy Manns- LOV 07/29/23 preop  Cardiologist : Excell Seltzer tele clearance- 08/19/23  DR Excell Seltzer LOV 11/22/22  Edd Fabian telephone visit - 08/15/23  Pulmonology- Dgayli LOV 1/08/22/23  Neurology- Camara LOV 08/28/23   PPM/ ICD: Device Orders: Rep Notified:  Chest x-ray : 05/03/23- 2 view  EKG : 11/18/22  Echo : 12/19/22  Stress test: 2019  Cardiac Cath :   Activity level: can do a flight of stairs without difficutly  Sleep Study/ CPAP : none  Fasting Blood Sugar :      / Checks Blood Sugar -- times a day:     Hgba1c- 07/29/23- 5.9   Blood Thinner/ Instructions /Last Dose: ASA / Instructions/ Last Dose :    PT states at preop on 09/15/23 she currently has UTI.  Placed on Bactrim on 09/12/23.  Informed pt to let DR Supple office be aware.  Pt was given office phone number.  PT to be on antibiotic for 30 days per pt.  PT voiced understanding.  To call offce.     PT does not like Ensure PreSurgery drink.  Did not give to pt.  PT promises to drink fluids listed on preop instructions on am of surgery.

## 2023-09-09 NOTE — Patient Instructions (Signed)
 SURGICAL WAITING ROOM VISITATION  Patients having surgery or a procedure may have no more than 2 support people in the waiting area - these visitors may rotate.    Children under the age of 55 must have an adult with them who is not the patient.  Due to an increase in RSV and influenza rates and associated hospitalizations, children ages 58 and under may not visit patients in [facility] hospitals.  Visitors with respiratory illnesses are discouraged from visiting and should remain at home.  If the patient needs to stay at the hospital during part of their recovery, the visitor guidelines for inpatient rooms apply. Pre-op nurse will coordinate an appropriate time for 1 support person to accompany patient in pre-op.  This support person may not rotate.    Please refer to the [facility] website for the visitor guidelines for Inpatients (after your surgery is over and you are in a regular room).       Your procedure is scheduled on:  09/25/2023    Report to [facility] Main Entrance    Report to admitting at  0700 AM   Call this number if you have problems the morning of surgery [phone]   Do not eat food :After Midnight.   After Midnight you may have the following liquids until __ 0630____ AM  DAY OF SURGERY  Water Non-Citrus Juices (without pulp, NO RED-Apple, White grape, White cranberry) Black Coffee (NO MILK/CREAM OR CREAMERS, sugar ok)  Clear Tea (NO MILK/CREAM OR CREAMERS, sugar ok) regular and decaf                             Plain Jell-O (NO RED)                                           Fruit ices (not with fruit pulp, NO RED)                                     Popsicles (NO RED)                                                               Sports drinks like Gatorade (NO RED)                     The day of surgery:  Drink ONE (1) Pre-Surgery Clear Ensure or G2 at 0630 AM ( have completed by )  the morning of surgery. Drink in one sitting. Do not sip.   This drink was given to you during your hospital  pre-op appointment visit. Nothing else to drink after completing the  Pre-Surgery Clear Ensure or G2.          If you have questions, please contact your surgeon's office.       Oral Hygiene is also important to reduce your risk of infection.  Remember - BRUSH YOUR TEETH THE MORNING OF SURGERY WITH YOUR REGULAR TOOTHPASTE  DENTURES WILL BE REMOVED PRIOR TO SURGERY PLEASE DO NOT APPLY "Poly grip" OR ADHESIVES!!!   Do NOT smoke after Midnight   Stop all vitamins and herbal supplements 7 days before surgery.   Take these medicines the morning of surgery with A SIP OF WATER:  coreg, flonase, protonix   DO NOT TAKE ANY ORAL DIABETIC MEDICATIONS DAY OF YOUR SURGERY  Bring CPAP mask and tubing day of surgery.                              You may not have any metal on your body including hair pins, jewelry, and body piercing             Do not wear make-up, lotions, powders, perfumes/cologne, or deodorant  Do not wear nail polish including gel and S&S, artificial/acrylic nails, or any other type of covering on natural nails including finger and toenails. If you have artificial nails, gel coating, etc. that needs to be removed by a nail salon please have this removed prior to surgery or surgery may need to be canceled/ delayed if the surgeon/ anesthesia feels like they are unable to be safely monitored.   Do not shave  48 hours prior to surgery.               Men may shave face and neck.   Do not bring valuables to the hospital. [place] [facility] IS NOT             RESPONSIBLE   FOR VALUABLES.   Contacts, glasses, dentures or bridgework may not be worn into surgery.   Bring small overnight bag day of surgery.   DO NOT BRING YOUR HOME MEDICATIONS TO THE HOSPITAL. PHARMACY WILL DISPENSE MEDICATIONS LISTED ON YOUR MEDICATION LIST TO YOU DURING YOUR ADMISSION IN THE HOSPITAL!    Patients discharged on the  day of surgery will not be allowed to drive home.  Someone NEEDS to stay with you for the first 24 hours after anesthesia.   Special Instructions: Bring a copy of your healthcare power of attorney and living will documents the day of surgery if you haven't scanned them before.              Please read over the following fact sheets you were given: IF YOU HAVE QUESTIONS ABOUT YOUR PRE-OP INSTRUCTIONS PLEASE CALL [phone]   If you received a COVID test during your pre-op visit  it is requested that you wear a mask when out in public, stay away from anyone that may not be feeling well and notify your surgeon if you develop symptoms. If you test positive for Covid or have been in contact with anyone that has tested positive in the last 10 days please notify you surgeon.      Pre-operative 5 CHG Bath Instructions   You can play a key role in reducing the risk of infection after surgery. Your skin needs to be as free of germs as possible. You can reduce the number of germs on your skin by washing with CHG (chlorhexidine gluconate) soap before surgery. CHG is an antiseptic soap that kills germs and continues to kill germs even after washing.   DO NOT use if you have an allergy to chlorhexidine/CHG or antibacterial soaps. If your skin becomes reddened or irritated, stop using the CHG and notify one of our  RNs at [phone].   Please shower with the CHG soap starting 4 days before surgery using the following schedule:     Please keep in mind the following:  DO NOT shave, including legs and underarms, starting the day of your first shower.   You may shave your face at any point before/day of surgery.  Place clean sheets on your bed the day you start using CHG soap. Use a clean washcloth (not used since being washed) for each shower. DO NOT sleep with pets once you start using the CHG.   CHG Shower Instructions:  If you choose to wash your hair and private area, wash first with your normal  shampoo/soap.  After you use shampoo/soap, rinse your hair and body thoroughly to remove shampoo/soap residue.  Turn the water OFF and apply about 3 tablespoons (45 ml) of CHG soap to a CLEAN washcloth.  Apply CHG soap ONLY FROM YOUR NECK DOWN TO YOUR TOES (washing for 3-5 minutes)  DO NOT use CHG soap on face, private areas, open wounds, or sores.  Pay special attention to the area where your surgery is being performed.  If you are having back surgery, having someone wash your back for you may be helpful. Wait 2 minutes after CHG soap is applied, then you may rinse off the CHG soap.  Pat dry with a clean towel  Put on clean clothes/pajamas   If you choose to wear lotion, please use ONLY the CHG-compatible lotions on the back of this paper.     Additional instructions for the day of surgery: DO NOT APPLY any lotions, deodorants, cologne, or perfumes.   Put on clean/comfortable clothes.  Brush your teeth.  Ask your nurse before applying any prescription medications to the skin.      CHG Compatible Lotions   Aveeno Moisturizing lotion  Cetaphil Moisturizing Cream  Cetaphil Moisturizing Lotion  Clairol Herbal Essence Moisturizing Lotion, Dry Skin  Clairol Herbal Essence Moisturizing Lotion, Extra Dry Skin  Clairol Herbal Essence Moisturizing Lotion, Normal Skin  Curel Age Defying Therapeutic Moisturizing Lotion with Alpha Hydroxy  Curel Extreme Care Body Lotion  Curel Soothing Hands Moisturizing Hand Lotion  Curel Therapeutic Moisturizing Cream, Fragrance-Free  Curel Therapeutic Moisturizing Lotion, Fragrance-Free  Curel Therapeutic Moisturizing Lotion, Original Formula  Eucerin Daily Replenishing Lotion  Eucerin Dry Skin Therapy Plus Alpha Hydroxy Crme  Eucerin Dry Skin Therapy Plus Alpha Hydroxy Lotion  Eucerin Original Crme  Eucerin Original Lotion  Eucerin Plus Crme Eucerin Plus Lotion  Eucerin TriLipid Replenishing Lotion  Keri Anti-Bacterial Hand Lotion  Keri Deep  Conditioning Original Lotion Dry Skin Formula Softly Scented  Keri Deep Conditioning Original Lotion, Fragrance Free Sensitive Skin Formula  Keri Lotion Fast Absorbing Fragrance Free Sensitive Skin Formula  Keri Lotion Fast Absorbing Softly Scented Dry Skin Formula  Keri Original Lotion  Keri Skin Renewal Lotion Keri Silky Smooth Lotion  Keri Silky Smooth Sensitive Skin Lotion  Nivea Body Creamy Conditioning Oil  Nivea Body Extra Enriched Lotion  Nivea Body Original Lotion  Nivea Body Sheer Moisturizing Lotion Nivea Crme  Nivea Skin Firming Lotion  NutraDerm 30 Skin Lotion  NutraDerm Skin Lotion  NutraDerm Therapeutic Skin Cream  NutraDerm Therapeutic Skin Lotion  ProShield Protective Hand Cream  Provon moisturizing lotion   [facility]- Preparing for Total Shoulder Arthroplasty    Before surgery, you can play an important role. Because skin is not sterile, your skin needs to be as free of germs as possible. You can reduce the number  of germs on your skin by using the following products. Benzoyl Peroxide Gel Reduces the number of germs present on the skin Applied twice a day to shoulder area starting two days before surgery    ==================================================================  Please follow these instructions carefully:  BENZOYL PEROXIDE 5% GEL  Please do not use if you have an allergy to benzoyl peroxide.   If your skin becomes reddened/irritated stop using the benzoyl peroxide.  Starting two days before surgery, apply as follows: Apply benzoyl peroxide in the morning and at night. Apply after taking a shower. If you are not taking a shower clean entire shoulder front, back, and side along with the armpit with a clean wet washcloth.  Place a quarter-sized dollop on your shoulder and rub in thoroughly, making sure to cover the front, back, and side of your shoulder, along with the armpit.   2 days before ____ AM   ____ PM              1 day before ____ AM    ____ PM                         Do this twice a day for two days.  (Last application is the night before surgery, AFTER using the CHG soap as described below).  Do NOT apply benzoyl peroxide gel on the day of surgery.

## 2023-09-11 LAB — HM MAMMOGRAPHY

## 2023-09-15 ENCOUNTER — Encounter (HOSPITAL_COMMUNITY)
Admission: RE | Admit: 2023-09-15 | Discharge: 2023-09-15 | Disposition: A | Discharge status: Home / Self Care | Payer: Medicare HMO | Source: Ambulatory Visit | Attending: Orthopedic Surgery | Admitting: Orthopedic Surgery

## 2023-09-15 ENCOUNTER — Other Ambulatory Visit: Payer: Self-pay

## 2023-09-15 ENCOUNTER — Encounter (HOSPITAL_COMMUNITY): Payer: Self-pay

## 2023-09-15 VITALS — BP 137/79 | HR 71 | Temp 98.0°F | Resp 16 | Ht 64.5 in | Wt 180.0 lb

## 2023-09-15 DIAGNOSIS — Z01812 Encounter for preprocedural laboratory examination: Secondary | ICD-10-CM | POA: Diagnosis not present

## 2023-09-15 DIAGNOSIS — Z01818 Encounter for other preprocedural examination: Secondary | ICD-10-CM

## 2023-09-15 LAB — CBC
HCT: 43.8 % (ref 36.0–46.0)
Hemoglobin: 14 g/dL (ref 12.0–15.0)
MCH: 29.9 pg (ref 26.0–34.0)
MCHC: 32 g/dL (ref 30.0–36.0)
MCV: 93.6 fL (ref 80.0–100.0)
Platelets: 281 10*3/uL (ref 150–400)
RBC: 4.68 MIL/uL (ref 3.87–5.11)
RDW: 13.4 % (ref 11.5–15.5)
WBC: 7.8 10*3/uL (ref 4.0–10.5)
nRBC: 0 % (ref 0.0–0.2)

## 2023-09-15 LAB — BASIC METABOLIC PANEL
Anion gap: 11 (ref 5–15)
BUN: 18 mg/dL (ref 8–23)
CO2: 24 mmol/L (ref 22–32)
Calcium: 9.4 mg/dL (ref 8.9–10.3)
Chloride: 102 mmol/L (ref 98–111)
Creatinine, Ser: 1.08 mg/dL — ABNORMAL HIGH (ref 0.44–1.00)
GFR, Estimated: 52 mL/min — ABNORMAL LOW (ref >60)
Glucose, Bld: 104 mg/dL — ABNORMAL HIGH (ref 70–99)
Potassium: 4.1 mmol/L (ref 3.5–5.1)
Sodium: 137 mmol/L (ref 135–145)

## 2023-09-15 LAB — SURGICAL PCR SCREEN
MRSA, PCR: NEGATIVE
Staphylococcus aureus: NEGATIVE

## 2023-09-16 ENCOUNTER — Encounter: Payer: Self-pay | Admitting: Family Medicine

## 2023-09-25 ENCOUNTER — Encounter (HOSPITAL_COMMUNITY): Admission: RE | Payer: Self-pay | Source: Home / Self Care

## 2023-09-25 ENCOUNTER — Ambulatory Visit (HOSPITAL_COMMUNITY): Admission: RE | Admit: 2023-09-25 | Payer: Medicare HMO | Source: Home / Self Care | Admitting: Orthopedic Surgery

## 2023-09-25 SURGERY — ARTHROPLASTY, SHOULDER, TOTAL, REVERSE
Anesthesia: General | Site: Shoulder | Laterality: Right

## 2023-10-10 ENCOUNTER — Ambulatory Visit: Admitting: Family Medicine

## 2023-10-10 ENCOUNTER — Encounter: Payer: Self-pay | Admitting: Family Medicine

## 2023-10-10 VITALS — BP 155/80 | HR 66 | Temp 97.6°F | Ht 64.5 in | Wt 185.1 lb

## 2023-10-10 DIAGNOSIS — N390 Urinary tract infection, site not specified: Secondary | ICD-10-CM | POA: Diagnosis not present

## 2023-10-10 DIAGNOSIS — I10 Essential (primary) hypertension: Secondary | ICD-10-CM

## 2023-10-10 DIAGNOSIS — F4321 Adjustment disorder with depressed mood: Secondary | ICD-10-CM | POA: Diagnosis not present

## 2023-10-10 MED ORDER — AMLODIPINE BESYLATE 5 MG PO TABS: 5.0000 mg | ORAL_TABLET | Freq: Every day | ORAL | 1 refills | Status: DC

## 2023-10-10 NOTE — Assessment & Plan Note (Signed)
 Less busy lately /more health issues and more time to think about it Occational tearfulness Feels overall she is dealing well   More of her friends live in [place] =this is hard on her

## 2023-10-10 NOTE — Assessment & Plan Note (Addendum)
 Worsened in last month  bp is not in control currently BP Readings from Last 1 Encounters:  10/10/23 (!) 155/80   Coreg 12.5 mg bid  Chlorthalidone 12.5 mg daily  Most recent labs reviewed  Disc lifstyle change with low sodium diet and exercise   Discussed options for addnl treatment Is open to trying amlodipine again at low dose and taking at night Amlodipine 5 mg sent to pharmacy  Follow up approx 2 wk   Needs better blood pressure for upcoming shoulder surgery on 5/1  Will continue to work on lifestyle habits  GFR of 52 early this mo in setting of frequent uti

## 2023-10-10 NOTE — Assessment & Plan Note (Addendum)
 Pt has prolapse and trouble emptying  Reviewed notes and plan from uro gyn today Has done pelvic PT  Tried pessary -intol  Reviewed uro gyn notes/ just finished bactrm and plan is to start methenamine 1 g bid   Good water intake  Will continue to follow

## 2023-10-10 NOTE — Patient Instructions (Addendum)
 Continue current medicines  Add amlodipine 5 mg daily - in the evening  If intolerable side effects let me know   Follow up in 2 weeks for blood pressure visit   Watch diet for sodium and processed foods   Stay as physically active as you can safely be

## 2023-10-10 NOTE — Progress Notes (Signed)
 Subjective:    Patient ID: Nicole Suarez, female    DOB: [birth year], 83 y.o.   MRN: [id number]  HPI  Wt Readings from Last 3 Encounters:  10/10/23 185 lb 2 oz (84 kg)  09/15/23 180 lb (81.6 kg)  08/28/23 188 lb (85.3 kg)   31.29 kg/m  Vitals:   10/10/23 1112 10/10/23 1148  BP: (!) 146/92 (!) 155/80  Pulse: 66   Temp: 97.6 F (36.4 C)   SpO2: 100%     Pt presents for HTN visit pre operatively / has 2nd shoulder replacement planned in early may     Some stress Got financially scammed and it was a lot of work to get back on track  Feels tense  Also a bit lonely   Was really working on weight loss slow going   Breathing is the same    Just finished course of bactrim for uti from her uro gyn Feels better  Will be starting methenamine soon  Tried pessary and did not tolerate it (has prolapse)  Did pelvic floor therapy   Also PT for legs  But having hip pain     Had her mammogram    Lab Results  Component Value Date   NA 137 09/15/2023   K 4.1 09/15/2023   CO2 24 09/15/2023   GLUCOSE 104 (H) 09/15/2023   BUN 18 09/15/2023   CREATININE 1.08 (H) 09/15/2023   CALCIUM 9.4 09/15/2023   GFR 70.81 05/13/2023   GFRNONAA 52 (L) 09/15/2023    HTN Her blood pressure readings have been elevated at home  140s to 160 over 70s  No cp or palpitations or headaches or edema  No side effects to medicines  BP Readings from Last 3 Encounters:  10/10/23 (!) 155/80  09/15/23 137/79  08/28/23 (!) 144/84    Coreg 12.5 mg bid  Chlorthalidone 12.5 mg daily   Pulse Readings from Last 3 Encounters:  10/10/23 66  09/15/23 71  08/28/23 75    Did take amlodipine in past but it caused fatigued  Has throat swelling with ace     Sees cardiology and pulmonary    Patient Active Problem List   Diagnosis Date Noted   Frequent UTI 10/10/2023   DOE (dyspnea on exertion) 07/29/2023   Urinary frequency 06/09/2023   Leg weakness, bilateral 05/13/2023   Left-sided  headache 05/13/2023   Wheezing 05/13/2023   Nocturnal leg cramps 02/19/2023   Hypokalemia 02/19/2023   Mixed incontinence 02/19/2023   Right shoulder pain 01/13/2023   Arm paresthesia, right 11/18/2022   Impaired exercise tolerance 11/17/2022   Dizziness 10/28/2022   Grief reaction 10/28/2022   Pre-operative clearance 12/19/2021   Current use of proton pump inhibitor 05/24/2021   Low back pain 09/13/2020   Estrogen deficiency 04/26/2020   Prediabetes 04/26/2020   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   GERD (gastroesophageal reflux disease) 07/26/2019   Essential hypertension 07/26/2019   Hyperlipidemia 07/26/2019   Allergic rhinitis 07/26/2019   Urge incontinence 07/26/2019   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   RLS (restless legs syndrome) 07/26/2019   Colon polyp 07/26/2019   Osteopenia 07/26/2019   History of Mohs micrographic surgery for skin cancer 07/26/2019   Past Medical History:  Diagnosis Date   Arthritis    Cataract    bilateral lens implants   GERD (gastroesophageal reflux disease)    History of allergy    History of basal cell carcinoma (BCC) 2015   right temple Mohs  History of blood transfusion    History of SCC (squamous cell carcinoma) of skin 2017   right upper arm excised   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    History of urinary incontinence    History of UTI    Hyperlipidemia    Hypertension    Neuromuscular disorder (HCC)    restless legs    PONV (postoperative nausea and vomiting)    Past Surgical History:  Procedure Laterality Date   APPENDECTOMY  1963   CARPAL TUNNEL RELEASE     CATARACT EXTRACTION, BILATERAL     08/2019, 09/2019   CESAREAN SECTION  1964   MICRODISCECTOMY LUMBAR     L3L4   RECTOCELE REPAIR  2009   REMOVAL OF URINARY SLING     REPLACEMENT TOTAL KNEE  2010   REVERSE SHOULDER ARTHROPLASTY Left 01/31/2022   Procedure: REVERSE SHOULDER  ARTHROPLASTY;  Surgeon: Francena Hanly, MD;  Location: [facility];  Service: Orthopedics;  Laterality: Left;   ROTATOR CUFF REPAIR  2000   urterine suspension  1971   Social History   Tobacco Use   Smoking status: Never   Smokeless tobacco: Never  Vaping Use   Vaping status: Never Used  Substance Use Topics   Alcohol use: Never   Drug use: Never   Family History  Problem Relation Age of Onset   Early death Mother    Cancer Father    COPD Father    Lung cancer Father    Pancreatic cancer Sister    Alcohol abuse Brother    Heart disease Brother    Hypertension Brother    COPD Maternal Grandmother    Colon cancer Neg Hx    Esophageal cancer Neg Hx    Stomach cancer Neg Hx    Rectal cancer Neg Hx    Colon polyps Neg Hx    Allergies  Allergen Reactions   Ace Inhibitors Other (See Comments)    THROAT SWELLING   Requip [Ropinirole] Other (See Comments)    Vision change    Shrimp [Shellfish Allergy] Nausea And Vomiting   Aleve [Naproxen] Rash   Current Outpatient Medications on File Prior to Visit  Medication Sig Dispense Refill   amoxicillin (AMOXIL) 500 MG capsule Take 2,000 mg by mouth See admin instructions. Take 4 capsules (2000 mg) by mouth 1 hour prior to dental appointments     Ascorbic Acid (VITAMIN C PO) Take 1 tablet by mouth in the morning.     bromocriptine (PARLODEL) 2.5 MG tablet Take 1 tablet (2.5 mg total) by mouth daily. (Patient taking differently: Take 2.5 mg by mouth every evening.) 90 tablet 3   Calcium Carb-Cholecalciferol (CALCIUM + D3 PO) Take 1 tablet by mouth in the morning.     carvedilol (COREG) 12.5 MG tablet TAKE 1 TABLET TWICE A DAY WITH MEALS 180 tablet 3   chlorthalidone (HYGROTON) 25 MG tablet TAKE ONE-HALF (1/2) TABLET DAILY 45 tablet 3   cholecalciferol (VITAMIN D3) 25 MCG (1000 UNIT) tablet Take 1,000 Units by mouth in the morning.     Coenzyme Q10 (COQ10 PO) Take 1 capsule by mouth 2 (two) times a week.     CRANBERRY-VITAMIN C-D MANNOSE PO  Take 2 capsules by mouth in the morning and at bedtime.     estradiol (ESTRACE VAGINAL) 0.1 MG/GM vaginal cream Pea sized amount to urethra/vulvar area twice weekly 42.5 g 1   fluticasone (FLONASE) 50 MCG/ACT nasal spray Place 2 sprays into both nostrils daily. (Patient  taking differently: Place 2 sprays into both nostrils every evening.) 48 g 3   ibuprofen (ADVIL) 400 MG tablet Take 400 mg by mouth in the morning and at bedtime.     levocetirizine (XYZAL) 5 MG tablet Take 5 mg by mouth every evening.     Magnesium 250 MG CAPS Take 250 mg by mouth at bedtime.     Multiple Vitamin (MULTIVITAMIN WITH MINERALS) TABS tablet Take 1 tablet by mouth in the morning.     Multiple Vitamins-Minerals (PRESERVISION AREDS 2 PO) Take 2 capsules by mouth in the morning.     pantoprazole (PROTONIX) 40 MG tablet TAKE 1 TABLET DAILY (Patient taking differently: Take 40 mg by mouth every evening.) 90 tablet 1   Polyethyl Glycol-Propyl Glycol (SYSTANE OP) Place 1 drop into both eyes in the morning and at bedtime.     polyethylene glycol powder (GLYCOLAX/MIRALAX) 17 GM/SCOOP powder Take 8.5 g by mouth in the morning.     rosuvastatin (CRESTOR) 10 MG tablet TAKE 1 TABLET AT BEDTIME 90 tablet 3   senna (SENOKOT) 8.6 MG TABS tablet Take 1 tablet by mouth at bedtime.     TURMERIC PO Take 1 capsule by mouth 2 (two) times a week.     methenamine (HIPREX) 1 g tablet Take 1 g by mouth 2 (two) times daily. (Patient not taking: Reported on 10/10/2023)     No current facility-administered medications on file prior to visit.    Review of Systems  Constitutional:  Negative for activity change, appetite change, fatigue, fever and unexpected weight change.  HENT:  Negative for congestion, ear pain, rhinorrhea, sinus pressure and sore throat.   Eyes:  Negative for pain, redness and visual disturbance.  Respiratory:  Negative for cough, shortness of breath and wheezing.   Cardiovascular:  Negative for chest pain and palpitations.   Gastrointestinal:  Negative for abdominal pain, blood in stool, constipation and diarrhea.  Endocrine: Negative for polydipsia and polyuria.  Genitourinary:  Negative for dysuria, frequency and urgency.  Musculoskeletal:  Negative for arthralgias, back pain and myalgias.  Skin:  Negative for pallor and rash.  Allergic/Immunologic: Negative for environmental allergies.  Neurological:  Negative for dizziness, syncope and headaches.  Hematological:  Negative for adenopathy. Does not bruise/bleed easily.  Psychiatric/Behavioral:  Negative for decreased concentration and dysphoric mood. The patient is not nervous/anxious.        Grief Occational tearfulness  Feels lonely        Objective:   Physical Exam Constitutional:      General: She is not in acute distress.    Appearance: Normal appearance. She is well-developed. She is obese. She is not ill-appearing or diaphoretic.  HENT:     Head: Normocephalic and atraumatic.  Eyes:     Conjunctiva/sclera: Conjunctivae normal.     Pupils: Pupils are equal, round, and reactive to light.  Neck:     Thyroid: No thyromegaly.     Vascular: No carotid bruit or JVD.  Cardiovascular:     Rate and Rhythm: Normal rate and regular rhythm.     Heart sounds: Normal heart sounds.     No gallop.  Pulmonary:     Effort: Pulmonary effort is normal. No respiratory distress.     Breath sounds: Normal breath sounds. No wheezing or rales.  Abdominal:     General: There is no distension or abdominal bruit.     Palpations: Abdomen is soft.  Musculoskeletal:     Cervical back: Normal range of motion  and neck supple.     Right lower leg: No edema.     Left lower leg: No edema.  Lymphadenopathy:     Cervical: No cervical adenopathy.  Skin:    General: Skin is warm and dry.     Coloration: Skin is not pale.     Findings: No rash.  Neurological:     Mental Status: She is alert.     Coordination: Coordination normal.     Deep Tendon Reflexes: Reflexes  are normal and symmetric. Reflexes normal.  Psychiatric:        Mood and Affect: Mood normal.           Assessment & Plan:   Problem List Items Addressed This Visit       Cardiovascular and Mediastinum   Essential hypertension - Primary   Worsened in last month  bp is not in control currently BP Readings from Last 1 Encounters:  10/10/23 (!) 155/80   Coreg 12.5 mg bid  Chlorthalidone 12.5 mg daily  Most recent labs reviewed  Disc lifstyle change with low sodium diet and exercise   Discussed options for addnl treatment Is open to trying amlodipine again at low dose and taking at night Amlodipine 5 mg sent to pharmacy  Follow up approx 2 wk   Needs better blood pressure for upcoming shoulder surgery on 5/1  Will continue to work on lifestyle habits  GFR of 52 early this mo in setting of frequent uti       Relevant Medications   amLODipine (NORVASC) 5 MG tablet     Genitourinary   Frequent UTI   Pt has prolapse and trouble emptying  Reviewed notes and plan from uro gyn today Has done pelvic PT  Tried pessary -intol  Reviewed uro gyn notes/ just finished bactrm and plan is to start methenamine 1 g bid   Good water intake  Will continue to follow       Relevant Medications   methenamine (HIPREX) 1 g tablet     Other   Grief reaction   Less busy lately /more health issues and more time to think about it Occational tearfulness Feels overall she is dealing well   More of her friends live in [place] =this is hard on her

## 2023-10-14 ENCOUNTER — Other Ambulatory Visit: Payer: Self-pay | Admitting: Family Medicine

## 2023-10-24 ENCOUNTER — Encounter: Payer: Self-pay | Admitting: Family Medicine

## 2023-10-24 ENCOUNTER — Ambulatory Visit: Admitting: Family Medicine

## 2023-10-24 VITALS — BP 129/62 | HR 69 | Temp 97.6°F | Ht 64.5 in | Wt 186.4 lb

## 2023-10-24 DIAGNOSIS — R7989 Other specified abnormal findings of blood chemistry: Secondary | ICD-10-CM

## 2023-10-24 DIAGNOSIS — I10 Essential (primary) hypertension: Secondary | ICD-10-CM | POA: Diagnosis not present

## 2023-10-24 DIAGNOSIS — R600 Localized edema: Secondary | ICD-10-CM | POA: Insufficient documentation

## 2023-10-24 MED ORDER — BROMOCRIPTINE MESYLATE 2.5 MG PO TABS: 2.5000 mg | ORAL_TABLET | Freq: Every day | ORAL | 3 refills | Status: AC

## 2023-10-24 MED ORDER — AMLODIPINE BESYLATE 5 MG PO TABS: 5.0000 mg | ORAL_TABLET | Freq: Every day | ORAL | 3 refills | Status: DC

## 2023-10-24 NOTE — Assessment & Plan Note (Signed)
 Suspect this is from addition of amlodipine and worse at end of the day This is working well for bp Reassuring exam  Encouraged continued use of knee high support socks during the day   No change in medications

## 2023-10-24 NOTE — Patient Instructions (Addendum)
 When you are able to exercise regularly - get going with that  Add some strength training to your routine, this is important for bone and brain health and can reduce your risk of falls and help your body use insulin properly and regulate weight  Light weights, exercise bands , and internet videos are a good way to start  Yoga (chair or regular), machines , floor exercises or a gym with machines are also good options  Water exercise is wonderful!  Get to the [facility]   So glad the blood pressure is better   Try to get most of your carbohydrates from produce (with the exception of white potatoes) and whole grains Eat less bread/pasta/rice/snack foods/cereals/sweets and other items from the middle of the grocery store (processed carbs)  Avoid processed foods (they have more sodium)    Wear your knee high support hose during the day (take off to sleep) Especially for traveling  The amlodipine causes your swelling  Elevated feet when you can (when sitting)

## 2023-10-24 NOTE — Assessment & Plan Note (Signed)
 Refilled parlodel today 2.5 mg daily  No clinical changes

## 2023-10-24 NOTE — Progress Notes (Signed)
 Subjective:    Patient ID: Nicole Suarez, female    DOB: Nov 15, 1940, 83 y.o.   MRN: [id number]  HPI  Wt Readings from Last 3 Encounters:  10/24/23 186 lb 6 oz (84.5 kg)  10/10/23 185 lb 2 oz (84 kg)  09/15/23 180 lb (81.6 kg)   31.50 kg/m  Vitals:   10/24/23 1003 10/24/23 1020  BP: 130/70 129/62  Pulse: 69   Temp: 97.6 F (36.4 C)   SpO2: 100%      Pt presents for follow up of HTN  Also needs refill of palodel   bp is stable today  No cp or palpitations or headaches or edema  No side effects to medicines  BP Readings from Last 3 Encounters:  10/24/23 129/62  10/10/23 (!) 155/80  09/15/23 137/79    Pulse Readings from Last 3 Encounters:  10/24/23 69  10/10/23 66  09/15/23 71   Home blood pressure readings are down also  Limited exercise due to shoulder and chronic pain /arthritis   Carvedilol 12.5 mg bid  Chlorthalidone 12.5 mg daily  Last visit opted to try amlodipine again and take at night  5 mg daily   Has shoulder surgery upcoming 5/1  Having ankle edema  Got some support stockings   Lab Results  Component Value Date   NA 137 09/15/2023   K 4.1 09/15/2023   CO2 24 09/15/2023   GLUCOSE 104 (H) 09/15/2023   BUN 18 09/15/2023   CREATININE 1.08 (H) 09/15/2023   CALCIUM 9.4 09/15/2023   GFR 70.81 05/13/2023   GFRNONAA 52 (L) 09/15/2023   Has history of frequent utis Urology  Methenamine 1 g bid was started at last visit  Tolerating it  No problems      Patient Active Problem List   Diagnosis Date Noted   Pedal edema 10/24/2023   Frequent UTI 10/10/2023   DOE (dyspnea on exertion) 07/29/2023   Urinary frequency 06/09/2023   Leg weakness, bilateral 05/13/2023   Left-sided headache 05/13/2023   Wheezing 05/13/2023   Nocturnal leg cramps 02/19/2023   Hypokalemia 02/19/2023   Mixed incontinence 02/19/2023   Right shoulder pain 01/13/2023   Arm paresthesia, right 11/18/2022   Impaired exercise tolerance 11/17/2022   Dizziness  10/28/2022   Grief reaction 10/28/2022   Pre-operative clearance 12/19/2021   Current use of proton pump inhibitor 05/24/2021   Low back pain 09/13/2020   Estrogen deficiency 04/26/2020   Prediabetes 04/26/2020   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   GERD (gastroesophageal reflux disease) 07/26/2019   Essential hypertension 07/26/2019   Hyperlipidemia 07/26/2019   Allergic rhinitis 07/26/2019   Urge incontinence 07/26/2019   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   RLS (restless legs syndrome) 07/26/2019   Colon polyp 07/26/2019   Osteopenia 07/26/2019   History of Mohs micrographic surgery for skin cancer 07/26/2019   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    History of urinary incontinence    History of UTI    Hyperlipidemia    Hypertension    Neuromuscular disorder (HCC)  restless legs    PONV (postoperative nausea and vomiting)    Past Surgical History:  Procedure Laterality Date   APPENDECTOMY  1963   CARPAL TUNNEL RELEASE     CATARACT EXTRACTION, BILATERAL     08/2019, 09/2019   CESAREAN SECTION  1964   MICRODISCECTOMY LUMBAR     L3L4   RECTOCELE REPAIR  2009   REMOVAL OF URINARY SLING     REPLACEMENT TOTAL KNEE  2010   REVERSE SHOULDER ARTHROPLASTY Left 01/31/2022   Procedure: REVERSE SHOULDER ARTHROPLASTY;  Surgeon: Francena Hanly, MD;  Location: [facility];  Service: Orthopedics;  Laterality: Left;   ROTATOR CUFF REPAIR  2000   urterine suspension  1971   Social History   Tobacco Use   Smoking status: Never   Smokeless tobacco: Never  Vaping Use   Vaping  status: Never Used  Substance Use Topics   Alcohol use: Never   Drug use: Never   Family History  Problem Relation Age of Onset   Early death Mother    Cancer Father    COPD Father    Lung cancer Father    Pancreatic cancer Sister    Alcohol abuse Brother    Heart disease Brother    Hypertension Brother    COPD Maternal Grandmother    Colon cancer Neg Hx    Esophageal cancer Neg Hx    Stomach cancer Neg Hx    Rectal cancer Neg Hx    Colon polyps Neg Hx    Allergies  Allergen Reactions   Ace Inhibitors Other (See Comments)    THROAT SWELLING   Requip [Ropinirole] Other (See Comments)    Vision change    Shrimp [Shellfish Allergy] Nausea And Vomiting   Aleve [Naproxen] Rash   Current Outpatient Medications on File Prior to Visit  Medication Sig Dispense Refill   amoxicillin (AMOXIL) 500 MG capsule Take 2,000 mg by mouth See admin instructions. Take 4 capsules (2000 mg) by mouth 1 hour prior to dental appointments     Ascorbic Acid (VITAMIN C PO) Take 1 tablet by mouth in the morning.     Calcium Carb-Cholecalciferol (CALCIUM + D3 PO) Take 1 tablet by mouth in the morning.     carvedilol (COREG) 12.5 MG tablet TAKE 1 TABLET TWICE A DAY WITH MEALS 180 tablet 3   chlorthalidone (HYGROTON) 25 MG tablet TAKE ONE-HALF (1/2) TABLET DAILY 45 tablet 3   cholecalciferol (VITAMIN D3) 25 MCG (1000 UNIT) tablet Take 1,000 Units by mouth in the morning.     Coenzyme Q10 (COQ10 PO) Take 1 capsule by mouth 2 (two) times a week.     CRANBERRY-VITAMIN C-D MANNOSE PO Take 2 capsules by mouth in the morning and at bedtime.     estradiol (ESTRACE VAGINAL) 0.1 MG/GM vaginal cream Pea sized amount to urethra/vulvar area twice weekly 42.5 g 1   fluticasone (FLONASE) 50 MCG/ACT nasal spray Place 2 sprays into both nostrils daily. (Patient taking differently: Place 2 sprays into both nostrils every evening.) 48 g 3   ibuprofen (ADVIL) 400 MG tablet Take 400 mg by mouth in the morning and at bedtime.      levocetirizine (XYZAL) 5 MG tablet Take 5 mg by mouth every evening.     Magnesium 250 MG CAPS Take 250 mg by mouth at bedtime.     methenamine (HIPREX) 1 g tablet Take 1 g by mouth 2 (two) times daily.     Multiple Vitamin (MULTIVITAMIN WITH MINERALS) TABS tablet  Take 1 tablet by mouth in the morning.     Multiple Vitamins-Minerals (PRESERVISION AREDS 2 PO) Take 2 capsules by mouth in the morning.     pantoprazole (PROTONIX) 40 MG tablet TAKE 1 TABLET DAILY 90 tablet 1   Polyethyl Glycol-Propyl Glycol (SYSTANE OP) Place 1 drop into both eyes in the morning and at bedtime.     polyethylene glycol powder (GLYCOLAX/MIRALAX) 17 GM/SCOOP powder Take 8.5 g by mouth in the morning.     rosuvastatin (CRESTOR) 10 MG tablet TAKE 1 TABLET AT BEDTIME 90 tablet 3   senna (SENOKOT) 8.6 MG TABS tablet Take 1 tablet by mouth at bedtime.     TURMERIC PO Take 1 capsule by mouth 2 (two) times a week.     No current facility-administered medications on file prior to visit.    Review of Systems  Constitutional:  Negative for activity change, appetite change, fatigue, fever and unexpected weight change.  HENT:  Negative for congestion, ear pain, rhinorrhea, sinus pressure and sore throat.   Eyes:  Negative for pain, redness and visual disturbance.  Respiratory:  Negative for cough, shortness of breath and wheezing.   Cardiovascular:  Negative for chest pain and palpitations.  Gastrointestinal:  Negative for abdominal pain, blood in stool, constipation and diarrhea.  Endocrine: Negative for polydipsia and polyuria.  Genitourinary:  Negative for dysuria, frequency and urgency.  Musculoskeletal:  Positive for arthralgias. Negative for back pain and myalgias.  Skin:  Negative for pallor and rash.  Allergic/Immunologic: Negative for environmental allergies.  Neurological:  Negative for dizziness, syncope and headaches.  Hematological:  Negative for adenopathy. Does not bruise/bleed easily.   Psychiatric/Behavioral:  Negative for decreased concentration and dysphoric mood. The patient is not nervous/anxious.        Objective:   Physical Exam Constitutional:      General: She is not in acute distress.    Appearance: Normal appearance. She is well-developed. She is obese. She is not ill-appearing or diaphoretic.  HENT:     Head: Normocephalic and atraumatic.  Eyes:     Conjunctiva/sclera: Conjunctivae normal.     Pupils: Pupils are equal, round, and reactive to light.  Neck:     Thyroid: No thyromegaly.     Vascular: No carotid bruit or JVD.  Cardiovascular:     Rate and Rhythm: Normal rate and regular rhythm.     Heart sounds: Normal heart sounds.     No gallop.     Comments: Spider veins noted in LEs Pulmonary:     Effort: Pulmonary effort is normal. No respiratory distress.     Breath sounds: Normal breath sounds. No wheezing or rales.  Abdominal:     General: There is no distension or abdominal bruit.     Palpations: Abdomen is soft.  Musculoskeletal:     Cervical back: Normal range of motion and neck supple.     Right lower leg: Edema present.     Left lower leg: Edema present.     Comments: Mild /non pitting ankle edema noted (trace)  No tenderness   No skin changes No tenderness No palp cords   Lymphadenopathy:     Cervical: No cervical adenopathy.  Skin:    General: Skin is warm and dry.     Coloration: Skin is not pale.     Findings: No rash.  Neurological:     Mental Status: She is alert.     Coordination: Coordination normal.     Deep Tendon Reflexes:  Reflexes are normal and symmetric. Reflexes normal.  Psychiatric:        Mood and Affect: Mood normal.           Assessment & Plan:   Problem List Items Addressed This Visit       Cardiovascular and Mediastinum   Essential hypertension - Primary   bp in fair control at this time  BP Readings from Last 1 Encounters:  10/24/23 129/62   No changes needed-improved with addition of  amlodipine 5 mg (tolerating the pedal edema with supp hose) Most recent labs reviewed  Disc lifstyle change with low sodium diet and exercise  Plan to continue   Carvedilol 12.5 mg bid  Chlorthalidone 12.5 mg daily  Amlodipine 5 mg daily         Relevant Medications   amLODipine (NORVASC) 5 MG tablet     Other   Pedal edema   Suspect this is from addition of amlodipine and worse at end of the day This is working well for bp Reassuring exam  Encouraged continued use of knee high support socks during the day   No change in medications       Non-artifactual high serum prolactin   Refilled parlodel today 2.5 mg daily  No clinical changes

## 2023-10-24 NOTE — Assessment & Plan Note (Signed)
 bp in fair control at this time  BP Readings from Last 1 Encounters:  10/24/23 129/62   No changes needed-improved with addition of amlodipine 5 mg (tolerating the pedal edema with supp hose) Most recent labs reviewed  Disc lifstyle change with low sodium diet and exercise  Plan to continue   Carvedilol 12.5 mg bid  Chlorthalidone 12.5 mg daily  Amlodipine 5 mg daily

## 2023-10-28 ENCOUNTER — Encounter: Admitting: Dermatology

## 2023-11-03 ENCOUNTER — Encounter: Payer: Self-pay | Admitting: Dermatology

## 2023-11-03 ENCOUNTER — Ambulatory Visit: Admitting: Dermatology

## 2023-11-03 DIAGNOSIS — L570 Actinic keratosis: Secondary | ICD-10-CM | POA: Diagnosis not present

## 2023-11-03 DIAGNOSIS — Z1283 Encounter for screening for malignant neoplasm of skin: Secondary | ICD-10-CM

## 2023-11-03 DIAGNOSIS — Z85828 Personal history of other malignant neoplasm of skin: Secondary | ICD-10-CM

## 2023-11-03 DIAGNOSIS — D229 Melanocytic nevi, unspecified: Secondary | ICD-10-CM

## 2023-11-03 DIAGNOSIS — L814 Other melanin hyperpigmentation: Secondary | ICD-10-CM

## 2023-11-03 DIAGNOSIS — L821 Other seborrheic keratosis: Secondary | ICD-10-CM | POA: Diagnosis not present

## 2023-11-03 DIAGNOSIS — D1801 Hemangioma of skin and subcutaneous tissue: Secondary | ICD-10-CM

## 2023-11-03 DIAGNOSIS — W908XXA Exposure to other nonionizing radiation, initial encounter: Secondary | ICD-10-CM

## 2023-11-03 DIAGNOSIS — L578 Other skin changes due to chronic exposure to nonionizing radiation: Secondary | ICD-10-CM

## 2023-11-03 NOTE — Progress Notes (Signed)
 Follow-Up Visit   Subjective  Nicole Suarez is a 83 y.o. female who presents for the following: Skin Cancer Screening and Full Body Skin Exam. Hx of BCC, Hx of SCC. C/O dry spot on nose.   The patient presents for Total-Body Skin Exam (TBSE) for skin cancer screening and mole check. The patient has spots, moles and lesions to be evaluated, some may be new or changing and the patient may have concern these could be cancer.    The following portions of the chart were reviewed this encounter and updated as appropriate: medications, allergies, medical history  Review of Systems:  No other skin or systemic complaints except as noted in HPI or Assessment and Plan.  Objective  Well appearing patient in no apparent distress; mood and affect are within normal limits.  A full examination was performed including scalp, head, eyes, ears, nose, lips, neck, chest, axillae, abdomen, back, buttocks, bilateral upper extremities, bilateral lower extremities, hands, feet, fingers, toes, fingernails, and toenails. All findings within normal limits unless otherwise noted below.   Relevant physical exam findings are noted in the Assessment and Plan.  nasal dorsum Erythematous thin macule with gritty scale R nasal dorsum, forehead  Assessment & Plan   SKIN CANCER SCREENING PERFORMED TODAY.  HISTORY OF BASAL CELL CARCINOMA OF THE SKIN - No evidence of recurrence today - Recommend regular full body skin exams - Recommend daily broad spectrum sunscreen SPF 30+ to sun-exposed areas, reapply every 2 hours as needed.  - Call if any new or changing lesions are noted between office visits  HISTORY OF SQUAMOUS CELL CARCINOMA OF THE SKIN - No evidence of recurrence today - No lymphadenopathy - Recommend regular full body skin exams - Recommend daily broad spectrum sunscreen SPF 30+ to sun-exposed areas, reapply every 2 hours as needed.  - Call if any new or changing lesions are noted between office  visits     ACTINIC DAMAGE - Chronic condition, secondary to cumulative UV/sun exposure - diffuse scaly erythematous macules with underlying dyspigmentation - Recommend daily broad spectrum sunscreen SPF 30+ to sun-exposed areas, reapply every 2 hours as needed.  - Staying in the shade or wearing long sleeves, sun glasses (UVA+UVB protection) and wide brim hats (4-inch brim around the entire circumference of the hat) are also recommended for sun protection.  - Call for new or changing lesions.  LENTIGINES, SEBORRHEIC KERATOSES, HEMANGIOMAS - Benign normal skin lesions - Benign-appearing - Call for any changes  MELANOCYTIC NEVI - Tan-brown and/or pink-flesh-colored symmetric macules and papules - Benign appearing on exam today - Observation - Call clinic for new or changing moles - Recommend daily use of broad spectrum spf 30+ sunscreen to sun-exposed areas.   LENTIGO/pigmented SK Exam: tan patch at left lower eyelid Due to sun exposure Treatment Plan: Benign-appearing, observe. Recommend daily broad spectrum sunscreen SPF 30+ to sun-exposed areas, reapply every 2 hours as needed.  Call for any changes   AK (ACTINIC KERATOSIS) nasal dorsum Actinic keratoses are precancerous spots that appear secondary to cumulative UV radiation exposure/sun exposure over time. They are chronic with expected duration over 1 year. A portion of actinic keratoses will progress to squamous cell carcinoma of the skin. It is not possible to reliably predict which spots will progress to skin cancer and so treatment is recommended to prevent development of skin cancer.  Recommend daily broad spectrum sunscreen SPF 30+ to sun-exposed areas, reapply every 2 hours as needed.  Recommend staying in the shade or wearing  long sleeves, sun glasses (UVA+UVB protection) and wide brim hats (4-inch brim around the entire circumference of the hat). Call for new or changing lesions.  Patient deferred treatment at this  time. Prefers to watch for changes.   MULTIPLE BENIGN NEVI   SEBORRHEIC KERATOSES   LENTIGINES   ACTINIC ELASTOSIS   CHERRY ANGIOMA   Return in about 1 year (around 11/02/2024) for TBSE, HxBCC, HxSCC.  I, Jill Parcell, CMA, am acting as scribe for Harris Liming, MD.   Documentation: I have reviewed the above documentation for accuracy and completeness, and I agree with the above.  Harris Liming, MD

## 2023-11-03 NOTE — Patient Instructions (Signed)

## 2023-11-06 NOTE — Patient Instructions (Signed)
 SURGICAL WAITING ROOM VISITATION  Patients having surgery or a procedure may have no more than 2 support people in the waiting area - these visitors may rotate.    Children under the age of 61 must have an adult with them who is not the patient.  Due to an increase in RSV and influenza rates and associated hospitalizations, children ages 33 and under may not visit patients in [facility] hospitals.  Visitors with respiratory illnesses are discouraged from visiting and should remain at home.  If the patient needs to stay at the hospital during part of their recovery, the visitor guidelines for inpatient rooms apply. Pre-op nurse will coordinate an appropriate time for 1 support person to accompany patient in pre-op.  This support person may not rotate.    Please refer to the [facility] website for the visitor guidelines for Inpatients (after your surgery is over and you are in a regular room).    Your procedure is scheduled on: 11/13/23   Report to [facility] Main Entrance    Report to admitting at 5:15 AM   Call this number if you have problems the morning of surgery [phone]   Do not eat food :After Midnight.   After Midnight you may have the following liquids until 4:30 AM DAY OF SURGERY  Water  Non-Citrus Juices (without pulp, NO RED-Apple, White grape, White cranberry) Black Coffee (NO MILK/CREAM OR CREAMERS, sugar ok)  Clear Tea (NO MILK/CREAM OR CREAMERS, sugar ok) regular and decaf                             Plain Jell-O (NO RED)                                           Fruit ices (not with fruit pulp, NO RED)                                     Popsicles (NO RED)                                                               Sports drinks like Gatorade (NO RED)                 The day of surgery:  Drink ONE (1) Pre-Surgery Clear Ensure at 4:30 AM the morning of surgery. Drink in one sitting. Do not sip.  This drink was given to you during your hospital   pre-op appointment visit. Nothing else to drink after completing the  Pre-Surgery Clear Ensure          If you have questions, please contact your surgeon's office.   FOLLOW BOWEL PREP AND ANY ADDITIONAL PRE OP INSTRUCTIONS YOU RECEIVED FROM YOUR SURGEON'S OFFICE!!!     Oral Hygiene is also important to reduce your risk of infection.                                    Remember -  BRUSH YOUR TEETH THE MORNING OF SURGERY WITH YOUR REGULAR TOOTHPASTE  DENTURES WILL BE REMOVED PRIOR TO SURGERY PLEASE DO NOT APPLY "Poly grip" OR ADHESIVES!!!   Stop all vitamins and herbal supplements 7 days before surgery.   Take these medicines the morning of surgery with A SIP OF WATER : Amlodipine , Carvedilol , Pantoprazole , Rosuvastatin                                You may not have any metal on your body including hair pins, jewelry, and body piercing             Do not wear make-up, lotions, powders, perfumes, or deodorant  Do not wear nail polish including gel and S&S, artificial/acrylic nails, or any other type of covering on natural nails including finger and toenails. If you have artificial nails, gel coating, etc. that needs to be removed by a nail salon please have this removed prior to surgery or surgery may need to be canceled/ delayed if the surgeon/ anesthesia feels like they are unable to be safely monitored.   Do not shave  48 hours prior to surgery.    Do not bring valuables to the hospital. [facility] IS NOT             RESPONSIBLE   FOR VALUABLES.   Contacts, glasses, dentures or bridgework may not be worn into surgery.  DO NOT BRING YOUR HOME MEDICATIONS TO THE HOSPITAL. PHARMACY WILL DISPENSE MEDICATIONS LISTED ON YOUR MEDICATION LIST TO YOU DURING YOUR ADMISSION IN THE HOSPITAL!    Patients discharged on the day of surgery will not be allowed to drive home.  Someone NEEDS to stay with you for the first 24 hours after anesthesia.              Please read over the following fact  sheets you were given: IF YOU HAVE QUESTIONS ABOUT YOUR PRE-OP INSTRUCTIONS PLEASE CALL [phone] Party   If you received a COVID test during your pre-op visit  it is requested that you wear a mask when out in public, stay away from anyone that may not be feeling well and notify your surgeon if you develop symptoms. If you test positive for Covid or have been in contact with anyone that has tested positive in the last 10 days please notify you surgeon.     Pre-operative 5 CHG Bath Instructions   You can play a key role in reducing the risk of infection after surgery. Your skin needs to be as free of germs as possible. You can reduce the number of germs on your skin by washing with CHG (chlorhexidine  gluconate) soap before surgery. CHG is an antiseptic soap that kills germs and continues to kill germs even after washing.   DO NOT use if you have an allergy to chlorhexidine /CHG or antibacterial soaps. If your skin becomes reddened or irritated, stop using the CHG and notify one of our RNs at [phone].   Please shower with the CHG soap starting 4 days before surgery using the following schedule:     Please keep in mind the following:  DO NOT shave, including legs and underarms, starting the day of your first shower.   You may shave your face at any point before/day of surgery.  Place clean sheets on your bed the day you start using CHG soap. Use a clean washcloth (not used since being washed) for each shower. DO NOT  sleep with pets once you start using the CHG.   CHG Shower Instructions:  If you choose to wash your hair and private area, wash first with your normal shampoo/soap.  After you use shampoo/soap, rinse your hair and body thoroughly to remove shampoo/soap residue.  Turn the water  OFF and apply about 3 tablespoons (45 ml) of CHG soap to a CLEAN washcloth.  Apply CHG soap ONLY FROM YOUR NECK DOWN TO YOUR TOES (washing for 3-5 minutes)  DO NOT use CHG soap on face, private  areas, open wounds, or sores.  Pay special attention to the area where your surgery is being performed.  If you are having back surgery, having someone wash your back for you may be helpful. Wait 2 minutes after CHG soap is applied, then you may rinse off the CHG soap.  Pat dry with a clean towel  Put on clean clothes/pajamas   If you choose to wear lotion, please use ONLY the CHG-compatible lotions on the back of this paper.     Additional instructions for the day of surgery: DO NOT APPLY any lotions, deodorants, cologne, or perfumes.   Put on clean/comfortable clothes.  Brush your teeth.  Ask your nurse before applying any prescription medications to the skin.      CHG Compatible Lotions   Aveeno Moisturizing lotion  Cetaphil Moisturizing Cream  Cetaphil Moisturizing Lotion  Clairol Herbal Essence Moisturizing Lotion, Dry Skin  Clairol Herbal Essence Moisturizing Lotion, Extra Dry Skin  Clairol Herbal Essence Moisturizing Lotion, Normal Skin  Curel Age Defying Therapeutic Moisturizing Lotion with Alpha Hydroxy  Curel Extreme Care Body Lotion  Curel Soothing Hands Moisturizing Hand Lotion  Curel Therapeutic Moisturizing Cream, Fragrance-Free  Curel Therapeutic Moisturizing Lotion, Fragrance-Free  Curel Therapeutic Moisturizing Lotion, Original Formula  Eucerin Daily Replenishing Lotion  Eucerin Dry Skin Therapy Plus Alpha Hydroxy Crme  Eucerin Dry Skin Therapy Plus Alpha Hydroxy Lotion  Eucerin Original Crme  Eucerin Original Lotion  Eucerin Plus Crme Eucerin Plus Lotion  Eucerin TriLipid Replenishing Lotion  Keri Anti-Bacterial Hand Lotion  Keri Deep Conditioning Original Lotion Dry Skin Formula Softly Scented  Keri Deep Conditioning Original Lotion, Fragrance Free Sensitive Skin Formula  Keri Lotion Fast Absorbing Fragrance Free Sensitive Skin Formula  Keri Lotion Fast Absorbing Softly Scented Dry Skin Formula  Keri Original Lotion  Keri Skin Renewal Lotion Keri  Silky Smooth Lotion  Keri Silky Smooth Sensitive Skin Lotion  Nivea Body Creamy Conditioning Oil  Nivea Body Extra Enriched Lotion  Nivea Body Original Lotion  Nivea Body Sheer Moisturizing Lotion Nivea Crme  Nivea Skin Firming Lotion  NutraDerm 30 Skin Lotion  NutraDerm Skin Lotion  NutraDerm Therapeutic Skin Cream  NutraDerm Therapeutic Skin Lotion  ProShield Protective Hand Cream  Provon moisturizing lotion  ________________________________________________________________________  Incentive Spirometer  An incentive spirometer is a tool that can help keep your lungs clear and active. This tool measures how well you are filling your lungs with each breath. Taking long deep breaths may help reverse or decrease the chance of developing breathing (pulmonary) problems (especially infection) following: A long period of time when you are unable to move or be active. BEFORE THE PROCEDURE  If the spirometer includes an indicator to show your best effort, your nurse or respiratory therapist will set it to a desired goal. If possible, sit up straight or lean slightly forward. Try not to slouch. Hold the incentive spirometer in an upright position. INSTRUCTIONS FOR USE  Sit on the edge of your bed if  possible, or sit up as far as you can in bed or on a chair. Hold the incentive spirometer in an upright position. Breathe out normally. Place the mouthpiece in your mouth and seal your lips tightly around it. Breathe in slowly and as deeply as possible, raising the piston or the ball toward the top of the column. Hold your breath for 3-5 seconds or for as long as possible. Allow the piston or ball to fall to the bottom of the column. Remove the mouthpiece from your mouth and breathe out normally. Rest for a few seconds and repeat Steps 1 through 7 at least 10 times every 1-2 hours when you are awake. Take your time and take a few normal breaths between deep breaths. The spirometer may include an  indicator to show your best effort. Use the indicator as a goal to work toward during each repetition. After each set of 10 deep breaths, practice coughing to be sure your lungs are clear. If you have an incision (the cut made at the time of surgery), support your incision when coughing by placing a pillow or rolled up towels firmly against it. Once you are able to get out of bed, walk around indoors and cough well. You may stop using the incentive spirometer when instructed by your caregiver.  RISKS AND COMPLICATIONS Take your time so you do not get dizzy or light-headed. If you are in pain, you may need to take or ask for pain medication before doing incentive spirometry. It is harder to take a deep breath if you are having pain. AFTER USE Rest and breathe slowly and easily. It can be helpful to keep track of a log of your progress. Your caregiver can provide you with a simple table to help with this. If you are using the spirometer at home, follow these instructions: SEEK MEDICAL CARE IF:  You are having difficultly using the spirometer. You have trouble using the spirometer as often as instructed. Your pain medication is not giving enough relief while using the spirometer. You develop fever of 100.5 F (38.1 C) or higher. SEEK IMMEDIATE MEDICAL CARE IF:  You cough up bloody sputum that had not been present before. You develop fever of 102 F (38.9 C) or greater. You develop worsening pain at or near the incision site. MAKE SURE YOU:  Understand these instructions. Will watch your condition. Will get help right away if you are not doing well or get worse. Document Released: 11/11/2006 Document Revised: 09/23/2011 Document Reviewed: 01/12/2007 Exit[employer] Patient Information 2014 [employer].   ________________________________________________________________________  [facility]- Preparing for Total Shoulder Arthroplasty    Before surgery, you can play an important role. Because  skin is not sterile, your skin needs to be as free of germs as possible. You can reduce the number of germs on your skin by using the following products. Benzoyl Peroxide Gel Reduces the number of germs present on the skin Applied twice a day to shoulder area starting two days before surgery    ==================================================================  Please follow these instructions carefully:  BENZOYL PEROXIDE 5% GEL  Please do not use if you have an allergy to benzoyl peroxide.   If your skin becomes reddened/irritated stop using the benzoyl peroxide.  Starting two days before surgery, apply as follows: Apply benzoyl peroxide in the morning and at night. Apply after taking a shower. If you are not taking a shower clean entire shoulder front, back, and side along with the armpit with a clean wet  washcloth.  Place a quarter-sized dollop on your shoulder and rub in thoroughly, making sure to cover the front, back, and side of your shoulder, along with the armpit.   2 days before ____ AM   ____ PM              1 day before ____ AM   ____ PM                         Do this twice a day for two days.  (Last application is the night before surgery, AFTER using the CHG soap as described below).  Do NOT apply benzoyl peroxide gel on the day of surgery.

## 2023-11-06 NOTE — Progress Notes (Signed)
 Patient still had CHG and benzo peroxide from last PAT appointment. Did not give new supplies   COVID Vaccine Completed: yes  Date of COVID positive in last 90 days: no  PCP - Deri Fleet, MD Cardiologist - Arnoldo Lapping, MD LOV 11/22/22   Medical clearance by Dr. Malissa Se in media tab dated 08/19/23 Cardiac clearance by Lawana Pray, NP 08/15/23 in Epic   Chest x-ray - 05/13/23 Epic EKG - 11/18/22 Epic Stress Test - 10/17/17 Epic  ECHO - 12/19/22 Epic Cardiac Cath - n/a Pacemaker/ICD device last checked: n/a Spinal Cord Stimulator: n/a  Bowel Prep - no  Sleep Study - n/a CPAP -   Fasting Blood Sugar - n/a Checks Blood Sugar _____ times a day  Last dose of GLP1 agonist-  N/A GLP1 instructions:  Hold 7 days before surgery    Last dose of SGLT-2 inhibitors-  N/A SGLT-2 instructions:  Hold 3 days before surgery    Blood Thinner Instructions:  Last dose: n/a  Time: Aspirin Instructions: Last Dose:  Activity level: Can go up a flight of stairs and perform activities of daily living without stopping and without symptoms of chest pain or shortness of breath. Trouble walking for long distance due to arthritis   Anesthesia review:   Patient denies shortness of breath, fever, cough and chest pain at PAT appointment  Patient verbalized understanding of instructions that were given to them at the PAT appointment. Patient was also instructed that they will need to review over the PAT instructions again at home before surgery.

## 2023-11-07 ENCOUNTER — Encounter (HOSPITAL_COMMUNITY)
Admission: RE | Admit: 2023-11-07 | Discharge: 2023-11-07 | Disposition: A | Discharge status: Home / Self Care | Source: Ambulatory Visit | Attending: Orthopedic Surgery | Admitting: Orthopedic Surgery

## 2023-11-07 ENCOUNTER — Encounter (HOSPITAL_COMMUNITY): Payer: Self-pay

## 2023-11-07 ENCOUNTER — Other Ambulatory Visit: Payer: Self-pay

## 2023-11-07 VITALS — BP 141/90 | HR 71 | Temp 97.6°F | Resp 16 | Ht 64.5 in | Wt 180.0 lb

## 2023-11-07 DIAGNOSIS — I10 Essential (primary) hypertension: Secondary | ICD-10-CM | POA: Diagnosis not present

## 2023-11-07 DIAGNOSIS — Z01812 Encounter for preprocedural laboratory examination: Secondary | ICD-10-CM | POA: Insufficient documentation

## 2023-11-07 DIAGNOSIS — Z01818 Encounter for other preprocedural examination: Secondary | ICD-10-CM

## 2023-11-07 LAB — CBC
HCT: 41.7 % (ref 36.0–46.0)
Hemoglobin: 13.7 g/dL (ref 12.0–15.0)
MCH: 30.2 pg (ref 26.0–34.0)
MCHC: 32.9 g/dL (ref 30.0–36.0)
MCV: 92.1 fL (ref 80.0–100.0)
Platelets: 243 10*3/uL (ref 150–400)
RBC: 4.53 MIL/uL (ref 3.87–5.11)
RDW: 13.3 % (ref 11.5–15.5)
WBC: 5.9 10*3/uL (ref 4.0–10.5)
nRBC: 0 % (ref 0.0–0.2)

## 2023-11-07 LAB — BASIC METABOLIC PANEL WITH GFR
Anion gap: 11 (ref 5–15)
BUN: 15 mg/dL (ref 8–23)
CO2: 27 mmol/L (ref 22–32)
Calcium: 9.7 mg/dL (ref 8.9–10.3)
Chloride: 101 mmol/L (ref 98–111)
Creatinine, Ser: 0.65 mg/dL (ref 0.44–1.00)
GFR, Estimated: >60 mL/min (ref >60)
Glucose, Bld: 91 mg/dL (ref 70–99)
Potassium: 3.3 mmol/L — ABNORMAL LOW (ref 3.5–5.1)
Sodium: 139 mmol/L (ref 135–145)

## 2023-11-07 LAB — SURGICAL PCR SCREEN
MRSA, PCR: NEGATIVE
Staphylococcus aureus: NEGATIVE

## 2023-11-12 NOTE — Anesthesia Preprocedure Evaluation (Addendum)
 Anesthesia Evaluation  Patient identified by MRN, date of birth, ID band Patient awake    Reviewed: Allergy & Precautions, H&P , NPO status , Patient's Chart, lab work & pertinent test results  History of Anesthesia Complications (+) PONV and history of anesthetic complications  Airway Mallampati: III  TM Distance: >3 FB Neck ROM: Full    Dental no notable dental hx. (+) Teeth Intact, Dental Advisory Given   Pulmonary neg pulmonary ROS   Pulmonary exam normal breath sounds clear to auscultation       Cardiovascular Exercise Tolerance: Good hypertension, Pt. on medications + DOE   Rhythm:Regular Rate:Normal     Neuro/Psych  Headaches  negative psych ROS   GI/Hepatic Neg liver ROS,GERD  Medicated,,  Endo/Other  negative endocrine ROS    Renal/GU negative Renal ROS  negative genitourinary   Musculoskeletal  (+) Arthritis , Osteoarthritis,    Abdominal   Peds  Hematology negative hematology ROS (+)   Anesthesia Other Findings   Reproductive/Obstetrics negative OB ROS                             Anesthesia Physical Anesthesia Plan  ASA: 2  Anesthesia Plan: General   Post-op Pain Management: Regional block* and Tylenol  PO (pre-op)*   Induction: Intravenous  PONV Risk Score and Plan: 4 or greater and Ondansetron  and Dexamethasone   Airway Management Planned: Oral ETT  Additional Equipment:   Intra-op Plan:   Post-operative Plan: Extubation in OR  Informed Consent: I have reviewed the patients History and Physical, chart, labs and discussed the procedure including the risks, benefits and alternatives for the proposed anesthesia with the patient or authorized representative who has indicated his/her understanding and acceptance.     Dental advisory given  Plan Discussed with: CRNA  Anesthesia Plan Comments:        Anesthesia Quick Evaluation

## 2023-11-13 ENCOUNTER — Ambulatory Visit (HOSPITAL_COMMUNITY)
Admission: RE | Admit: 2023-11-13 | Discharge: 2023-11-13 | Disposition: A | Discharge status: Home / Self Care | Attending: Orthopedic Surgery | Admitting: Orthopedic Surgery

## 2023-11-13 ENCOUNTER — Ambulatory Visit (HOSPITAL_COMMUNITY): Payer: Self-pay | Admitting: Anesthesiology

## 2023-11-13 ENCOUNTER — Encounter (HOSPITAL_COMMUNITY)
Admission: RE | Disposition: A | Discharge status: Home / Self Care | Payer: Self-pay | Source: Home / Self Care | Attending: Orthopedic Surgery

## 2023-11-13 ENCOUNTER — Encounter (HOSPITAL_COMMUNITY): Payer: Self-pay | Admitting: Orthopedic Surgery

## 2023-11-13 ENCOUNTER — Other Ambulatory Visit: Payer: Self-pay

## 2023-11-13 DIAGNOSIS — M12811 Other specific arthropathies, not elsewhere classified, right shoulder: Secondary | ICD-10-CM

## 2023-11-13 DIAGNOSIS — I10 Essential (primary) hypertension: Secondary | ICD-10-CM | POA: Insufficient documentation

## 2023-11-13 DIAGNOSIS — Z79899 Other long term (current) drug therapy: Secondary | ICD-10-CM | POA: Insufficient documentation

## 2023-11-13 DIAGNOSIS — K219 Gastro-esophageal reflux disease without esophagitis: Secondary | ICD-10-CM | POA: Diagnosis not present

## 2023-11-13 DIAGNOSIS — Z01818 Encounter for other preprocedural examination: Secondary | ICD-10-CM

## 2023-11-13 DIAGNOSIS — M75101 Unspecified rotator cuff tear or rupture of right shoulder, not specified as traumatic: Secondary | ICD-10-CM | POA: Insufficient documentation

## 2023-11-13 HISTORY — PX: REVERSE SHOULDER ARTHROPLASTY: SHX5054

## 2023-11-13 SURGERY — ARTHROPLASTY, SHOULDER, TOTAL, REVERSE
Anesthesia: General | Site: Shoulder | Laterality: Right

## 2023-11-13 MED ORDER — LACTATED RINGERS IV SOLN: INTRAVENOUS | Status: DC

## 2023-11-13 MED ORDER — BUPIVACAINE LIPOSOME 1.3 % IJ SUSP
INTRAMUSCULAR | Status: DC | PRN
  Administered 2023-11-13: 10 mL via PERINEURAL

## 2023-11-13 MED ORDER — DEXAMETHASONE SODIUM PHOSPHATE 10 MG/ML IJ SOLN
INTRAMUSCULAR | Status: AC
  Filled 2023-11-13: qty 1

## 2023-11-13 MED ORDER — CYCLOBENZAPRINE HCL 10 MG PO TABS: 10.0000 mg | ORAL_TABLET | Freq: Three times a day (TID) | ORAL | 1 refills | Status: DC | PRN

## 2023-11-13 MED ORDER — FENTANYL CITRATE (PF) 100 MCG/2ML IJ SOLN
INTRAMUSCULAR | Status: DC | PRN
  Administered 2023-11-13: 50 ug via INTRAVENOUS

## 2023-11-13 MED ORDER — ONDANSETRON HCL 4 MG/2ML IJ SOLN
INTRAMUSCULAR | Status: DC | PRN
  Administered 2023-11-13: 4 mg via INTRAVENOUS

## 2023-11-13 MED ORDER — PROPOFOL 10 MG/ML IV BOLUS
INTRAVENOUS | Status: AC
  Filled 2023-11-13: qty 20

## 2023-11-13 MED ORDER — CHLORHEXIDINE GLUCONATE 0.12 % MT SOLN: 15.0000 mL | Freq: Once | OROMUCOSAL | Status: DC

## 2023-11-13 MED ORDER — CHLORHEXIDINE GLUCONATE 0.12 % MT SOLN
15.0000 mL | Freq: Once | OROMUCOSAL | Status: AC
  Administered 2023-11-13: 15 mL via OROMUCOSAL

## 2023-11-13 MED ORDER — DEXAMETHASONE SODIUM PHOSPHATE 10 MG/ML IJ SOLN
INTRAMUSCULAR | Status: DC | PRN
  Administered 2023-11-13: 8 mg via INTRAVENOUS

## 2023-11-13 MED ORDER — VANCOMYCIN HCL 1000 MG IV SOLR
INTRAVENOUS | Status: DC | PRN
  Administered 2023-11-13: 1000 mg via TOPICAL

## 2023-11-13 MED ORDER — PROPOFOL 10 MG/ML IV BOLUS
INTRAVENOUS | Status: DC | PRN
  Administered 2023-11-13: 120 mg via INTRAVENOUS

## 2023-11-13 MED ORDER — PHENYLEPHRINE HCL-NACL 20-0.9 MG/250ML-% IV SOLN
INTRAVENOUS | Status: AC
Start: 2023-11-13 — End: 2023-11-13
  Filled 2023-11-13: qty 250

## 2023-11-13 MED ORDER — ACETAMINOPHEN 500 MG PO TABS
1000.0000 mg | ORAL_TABLET | Freq: Once | ORAL | Status: AC
  Administered 2023-11-13: 1000 mg via ORAL
  Filled 2023-11-13: qty 2

## 2023-11-13 MED ORDER — TRANEXAMIC ACID 1000 MG/10ML IV SOLN
1000.0000 mg | INTRAVENOUS | Status: DC
Start: 2023-11-13 — End: 2023-11-13

## 2023-11-13 MED ORDER — ORAL CARE MOUTH RINSE: 15.0000 mL | Freq: Once | OROMUCOSAL | Status: DC

## 2023-11-13 MED ORDER — LIDOCAINE HCL (PF) 2 % IJ SOLN
INTRAMUSCULAR | Status: AC
  Filled 2023-11-13: qty 5

## 2023-11-13 MED ORDER — ORAL CARE MOUTH RINSE: 15.0000 mL | Freq: Once | OROMUCOSAL | Status: AC

## 2023-11-13 MED ORDER — TRANEXAMIC ACID-NACL 1000-0.7 MG/100ML-% IV SOLN
1000.0000 mg | INTRAVENOUS | Status: AC
  Administered 2023-11-13: 1000 mg via INTRAVENOUS
  Filled 2023-11-13: qty 100

## 2023-11-13 MED ORDER — ONDANSETRON HCL 4 MG/2ML IJ SOLN
INTRAMUSCULAR | Status: AC
  Filled 2023-11-13: qty 2

## 2023-11-13 MED ORDER — 0.9 % SODIUM CHLORIDE (POUR BTL) OPTIME
TOPICAL | Status: DC | PRN
  Administered 2023-11-13: 1000 mL

## 2023-11-13 MED ORDER — FENTANYL CITRATE PF 50 MCG/ML IJ SOSY: 25.0000 ug | PREFILLED_SYRINGE | INTRAMUSCULAR | Status: DC | PRN

## 2023-11-13 MED ORDER — STERILE WATER FOR IRRIGATION IR SOLN
Status: DC | PRN
  Administered 2023-11-13: 1000 mL

## 2023-11-13 MED ORDER — CEFAZOLIN SODIUM-DEXTROSE 2-4 GM/100ML-% IV SOLN
2.0000 g | INTRAVENOUS | Status: AC
  Administered 2023-11-13: 2 g via INTRAVENOUS
  Filled 2023-11-13: qty 100

## 2023-11-13 MED ORDER — ROCURONIUM BROMIDE 100 MG/10ML IV SOLN
INTRAVENOUS | Status: DC | PRN
  Administered 2023-11-13: 10 mg via INTRAVENOUS
  Administered 2023-11-13: 50 mg via INTRAVENOUS

## 2023-11-13 MED ORDER — ONDANSETRON HCL 4 MG PO TABS: 4.0000 mg | ORAL_TABLET | Freq: Three times a day (TID) | ORAL | 0 refills | Status: DC | PRN

## 2023-11-13 MED ORDER — LIDOCAINE HCL (CARDIAC) PF 100 MG/5ML IV SOSY
PREFILLED_SYRINGE | INTRAVENOUS | Status: DC | PRN
  Administered 2023-11-13: 40 mg via INTRAVENOUS

## 2023-11-13 MED ORDER — SUGAMMADEX SODIUM 200 MG/2ML IV SOLN
INTRAVENOUS | Status: DC | PRN
Start: 2023-11-13 — End: 2023-11-13
  Administered 2023-11-13: 200 mg via INTRAVENOUS

## 2023-11-13 MED ORDER — VANCOMYCIN HCL 1000 MG IV SOLR
INTRAVENOUS | Status: AC
  Filled 2023-11-13: qty 20

## 2023-11-13 MED ORDER — FENTANYL CITRATE (PF) 100 MCG/2ML IJ SOLN
INTRAMUSCULAR | Status: AC
  Filled 2023-11-13: qty 2

## 2023-11-13 MED ORDER — OXYCODONE-ACETAMINOPHEN 5-325 MG PO TABS: 1.0000 | ORAL_TABLET | ORAL | 0 refills | Status: DC | PRN

## 2023-11-13 MED ORDER — BUPIVACAINE-EPINEPHRINE (PF) 0.5% -1:200000 IJ SOLN
INTRAMUSCULAR | Status: DC | PRN
  Administered 2023-11-13: 15 mL via PERINEURAL

## 2023-11-13 MED ORDER — PHENYLEPHRINE HCL-NACL 20-0.9 MG/250ML-% IV SOLN
INTRAVENOUS | Status: DC | PRN
Start: 2023-11-13 — End: 2023-11-13
  Administered 2023-11-13: 25 ug/min via INTRAVENOUS

## 2023-11-13 SURGICAL SUPPLY — 60 items
BAG COUNTER SPONGE SURGICOUNT (BAG) IMPLANT
BAG ZIPLOCK 12X15 (MISCELLANEOUS) ×1 IMPLANT
BIT DRILL AR 3 NS (BIT) IMPLANT
BLADE SAW SGTL 83.5X18.5 (BLADE) ×1 IMPLANT
BNDG COHESIVE 4X5 TAN STRL LF (GAUZE/BANDAGES/DRESSINGS) ×1 IMPLANT
CLSR STERI-STRIP ANTIMIC 1/2X4 (GAUZE/BANDAGES/DRESSINGS) IMPLANT
COOLER ICEMAN CLASSIC (MISCELLANEOUS) ×1 IMPLANT
COVER BACK TABLE 60X90IN (DRAPES) ×1 IMPLANT
COVER SURGICAL LIGHT HANDLE (MISCELLANEOUS) ×1 IMPLANT
CUP SUT UNIV REVERS 36 NEUTRAL (Cup) IMPLANT
DRAPE SHEET LG 3/4 BI-LAMINATE (DRAPES) ×1 IMPLANT
DRAPE SURG 17X11 SM STRL (DRAPES) ×1 IMPLANT
DRAPE SURG ORHT 6 SPLT 77X108 (DRAPES) ×2 IMPLANT
DRAPE TOP 10253 STERILE (DRAPES) ×1 IMPLANT
DRAPE U-SHAPE 47X51 STRL (DRAPES) ×1 IMPLANT
DRESSING AQUACEL AG SP 3.5X6 (GAUZE/BANDAGES/DRESSINGS) ×1 IMPLANT
DURAPREP 26ML APPLICATOR (WOUND CARE) ×1 IMPLANT
ELECT BLADE TIP CTD 4 INCH (ELECTRODE) ×1 IMPLANT
ELECT PENCIL ROCKER SW 15FT (MISCELLANEOUS) ×2 IMPLANT
ELECT REM PT RETURN 15FT ADLT (MISCELLANEOUS) ×1 IMPLANT
FACESHIELD WRAPAROUND (MASK) ×5 IMPLANT
FACESHIELD WRAPAROUND OR TEAM (MASK) ×5 IMPLANT
GLENOID UNI REV MOD 24 +2 LAT (Joint) IMPLANT
GLENOSPHERE 36 +4 LAT/24 (Joint) IMPLANT
GLOVE BIO SURGEON STRL SZ7.5 (GLOVE) ×1 IMPLANT
GLOVE BIO SURGEON STRL SZ8 (GLOVE) ×1 IMPLANT
GLOVE SS BIOGEL STRL SZ 7 (GLOVE) ×1 IMPLANT
GLOVE SS BIOGEL STRL SZ 7.5 (GLOVE) ×1 IMPLANT
GOWN STRL SURGICAL XL XLNG (GOWN DISPOSABLE) ×2 IMPLANT
INSERT HUMERAL UNI REVERS 36 3 (Insert) IMPLANT
KIT BASIN OR (CUSTOM PROCEDURE TRAY) ×1 IMPLANT
KIT TURNOVER KIT A (KITS) ×1 IMPLANT
MANIFOLD NEPTUNE II (INSTRUMENTS) ×1 IMPLANT
NDL TAPERED W/ NITINOL LOOP (MISCELLANEOUS) ×1 IMPLANT
NEEDLE TAPERED W/ NITINOL LOOP (MISCELLANEOUS) ×1 IMPLANT
NS IRRIG 1000ML POUR BTL (IV SOLUTION) ×1 IMPLANT
PACK SHOULDER (CUSTOM PROCEDURE TRAY) ×1 IMPLANT
PAD ARMBOARD POSITIONER FOAM (MISCELLANEOUS) ×1 IMPLANT
PAD COLD SHLDR WRAP-ON (PAD) ×1 IMPLANT
PIN NITINOL TARGETER 2.8 (PIN) IMPLANT
PIN SET MODULAR GLENOID SYSTEM (PIN) IMPLANT
RESTRAINT HEAD UNIVERSAL NS (MISCELLANEOUS) ×1 IMPLANT
SCREW CENTRAL MOD 30MM (Screw) IMPLANT
SCREW PERI LOCK 5.5X16 (Screw) IMPLANT
SCREW PERI LOCK 5.5X24 (Screw) IMPLANT
SCREW PERI LOCK 5.5X36 (Screw) IMPLANT
SCREW PERIPHERAL 5.5X20 LOCK (Screw) IMPLANT
SLING ARM FOAM STRAP LRG (SOFTGOODS) IMPLANT
SLING ARM FOAM STRAP MED (SOFTGOODS) IMPLANT
STEM HUM UNIV REV SZ11 (Stem) IMPLANT
STRIP CLOSURE SKIN 1/2X4 (GAUZE/BANDAGES/DRESSINGS) ×1 IMPLANT
SUT MNCRL AB 3-0 PS2 18 (SUTURE) ×1 IMPLANT
SUT MON AB 2-0 CT1 36 (SUTURE) ×1 IMPLANT
SUT VIC AB 1 CT1 36 (SUTURE) ×1 IMPLANT
SUTURE TAPE 1.3 40 TPR END (SUTURE) ×2 IMPLANT
TOWEL GREEN STERILE FF (TOWEL DISPOSABLE) ×1 IMPLANT
TOWEL OR 17X26 10 PK STRL BLUE (TOWEL DISPOSABLE) ×1 IMPLANT
TUBE SUCTION HIGH CAP CLEAR NV (SUCTIONS) ×1 IMPLANT
TUBING CONNECTING 10 (TUBING) ×1 IMPLANT
WATER STERILE IRR 1000ML POUR (IV SOLUTION) ×2 IMPLANT

## 2023-11-13 NOTE — Anesthesia Procedure Notes (Signed)
 Anesthesia Regional Block: Interscalene brachial plexus block   Pre-Anesthetic Checklist: , timeout performed,  Correct Patient, Correct Site, Correct Laterality,  Correct Procedure, Correct Position, site marked,  Risks and benefits discussed,  Pre-op evaluation,  At surgeon's request and post-op pain management  Laterality: Right  Prep: Maximum Sterile Barrier Precautions used, chloraprep       Needles:  Injection technique: Single-shot  Needle Type: Echogenic Stimulator Needle     Needle Length: 5cm  Needle Gauge: 22     Additional Needles:   Procedures:,,,, ultrasound used (permanent image in chart),,    Narrative:  Start time: 11/13/2023 6:57 AM End time: 11/13/2023 7:07 AM Injection made incrementally with aspirations every 5 mL.  Performed by: Personally  Anesthesiologist: Jake Mayers, MD

## 2023-11-13 NOTE — Transfer of Care (Signed)
 Immediate Anesthesia Transfer of Care Note  Patient: Nicole Suarez  Procedure(s) Performed: ARTHROPLASTY, SHOULDER, TOTAL, REVERSE (Right: Shoulder)  Patient Location: PACU  Anesthesia Type:General  Level of Consciousness: awake, drowsy, and responds to stimulation  Airway & Oxygen Therapy: Patient Spontanous Breathing and Patient connected to face mask oxygen  Post-op Assessment: Report given to RN and Post -op Vital signs reviewed and stable  Post vital signs: Reviewed and stable  Last Vitals:  Vitals Value Taken Time  BP 123/66 11/13/23  Temp    Pulse 68 11/13/23  Resp 18 11/13/23  SpO2 94 11/13/23    Last Pain:  Vitals:   11/13/23 0630  TempSrc:   PainSc: 5          Complications: No notable events documented.

## 2023-11-13 NOTE — H&P (Signed)
 Nicole Suarez    Chief Complaint: Right Rotatorcuff  tear arthropathy HPI: The patient is a 83 y.o. 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  She is brought to the operating room today for planned right shoulder reverse arthroplasty.  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    History of urinary incontinence    History of UTI    Hyperlipidemia    Hypertension    Neuromuscular disorder (HCC)    restless legs    PONV (postoperative nausea and vomiting)       Past Surgical History:  Procedure Laterality Date   APPENDECTOMY  1963   CARPAL TUNNEL RELEASE Bilateral    dequivan release   CATARACT EXTRACTION, BILATERAL     08/2019, 09/2019   CESAREAN SECTION  1964   MICRODISCECTOMY LUMBAR  2019   L3L4   RECTOCELE REPAIR  2009   REMOVAL OF URINARY SLING     REPLACEMENT TOTAL KNEE  2010   REVERSE SHOULDER ARTHROPLASTY Left 01/31/2022   Procedure: REVERSE SHOULDER ARTHROPLASTY;  Surgeon: Ellard Gunning, MD;  Location: [facility];  Service: Orthopedics;  Laterality: Left;   ROTATOR CUFF REPAIR  2000   urterine suspension  1971    Family History  Problem Relation Age of Onset   Early death Mother    Cancer Father    COPD Father    Lung cancer Father    Pancreatic cancer Sister    Alcohol abuse Brother    Heart disease Brother    Hypertension Brother    COPD Maternal Grandmother    Colon  cancer Neg Hx    Esophageal cancer Neg Hx    Stomach cancer Neg Hx    Rectal cancer Neg Hx    Colon polyps Neg Hx     Social History:  reports that she has never smoked. She has never used smokeless tobacco. She reports that she does not drink alcohol and does not use drugs.  BMI: Estimated body mass index is 30.42 kg/m as calculated from the following:   Height as of 11/07/23: 5' 4.5" (1.638 m).   Weight as of 11/07/23: 81.6 kg.  Lab Results  Component Value Date   ALBUMIN 4.3 05/13/2023   Diabetes: Patient does not have a diagnosis of diabetes. Lab Results  Component Value Date   HGBA1C 5.9 (A) 07/29/2023     Smoking Status:   reports that she has never smoked. She has never used smokeless tobacco.     Medications Prior to Admission  Medication Sig Dispense Refill   amLODipine  (NORVASC ) 5 MG tablet Take 1 tablet (5 mg total) by mouth daily. 90 tablet 3   amoxicillin  (AMOXIL ) 500 MG capsule Take 2,000 mg by mouth See admin instructions. Take 2000 mg by mouth 1 hour prior to dental appointments     Ascorbic Acid (VITAMIN C PO) Take 1 tablet  by mouth in the morning and at bedtime.     bromocriptine  (PARLODEL ) 2.5 MG tablet Take 1 tablet (2.5 mg total) by mouth daily. 90 tablet 3   Calcium  Carb-Cholecalciferol (CALCIUM  + D3 PO) Take 1 tablet by mouth in the morning.     carvedilol  (COREG ) 12.5 MG tablet TAKE 1 TABLET TWICE A DAY WITH MEALS 180 tablet 3   chlorthalidone  (HYGROTON ) 25 MG tablet TAKE ONE-HALF (1/2) TABLET DAILY 45 tablet 3   cholecalciferol (VITAMIN D3) 25 MCG (1000 UNIT) tablet Take 1,000 Units by mouth in the morning.     Coenzyme Q10 (COQ10 PO) Take 1 capsule by mouth 2 (two) times a week.     CRANBERRY-VITAMIN C-D MANNOSE PO Take 2 capsules by mouth in the morning and at bedtime.     estradiol  (ESTRACE  VAGINAL) 0.1 MG/GM vaginal cream Pea sized amount to urethra/vulvar area twice weekly 42.5 g 1   fluticasone  (FLONASE ) 50 MCG/ACT nasal spray Place 2 sprays  into both nostrils daily. (Patient taking differently: Place 2 sprays into both nostrils every evening.) 48 g 3   ibuprofen  (ADVIL ) 200 MG tablet Take 400-600 mg by mouth in the morning and at bedtime.     levocetirizine (XYZAL) 5 MG tablet Take 5 mg by mouth every evening.     Magnesium 250 MG CAPS Take 250 mg by mouth at bedtime.     methenamine (HIPREX) 1 g tablet Take 1 g by mouth 2 (two) times daily.     Multiple Vitamin (MULTIVITAMIN WITH MINERALS) TABS tablet Take 1 tablet by mouth in the morning.     Multiple Vitamins-Minerals (PRESERVISION AREDS 2 PO) Take 2 capsules by mouth in the morning.     pantoprazole  (PROTONIX ) 40 MG tablet TAKE 1 TABLET DAILY 90 tablet 1   Polyethyl Glycol-Propyl Glycol (SYSTANE OP) Place 1 drop into both eyes in the morning and at bedtime.     polyethylene glycol powder (GLYCOLAX /MIRALAX ) 17 GM/SCOOP powder Take 8.5 g by mouth in the morning.     rosuvastatin  (CRESTOR ) 10 MG tablet TAKE 1 TABLET AT BEDTIME 90 tablet 3   senna (SENOKOT) 8.6 MG TABS tablet Take 1 tablet by mouth at bedtime.     TURMERIC PO Take 1 capsule by mouth 2 (two) times a week.       Physical Exam: Right shoulder demonstrates painful and guarded motion as noted at her recent office visits.  She has globally decreased strength.  Neurovascular intact in the right upper extremity.  Imaging studies confirm changes consistent with chronic rotator cuff tear arthropathy.  Vitals  Temp:  [97.9 F (36.6 C)] 97.9 F (36.6 C) (05/01 0602) Pulse Rate:  [72] 72 (05/01 0602) Resp:  [16] 16 (05/01 0602) BP: (128)/(67) 128/67 (05/01 0602) SpO2:  [97 %] 97 % (05/01 0602)  Assessment/Plan  Impression: Right Rotatorcuff  tear arthropathy  Plan of Action: Procedure(s): ARTHROPLASTY, SHOULDER, TOTAL, REVERSE  Nicole Suarez M Nicole Suarez 11/13/2023, 6:21 AM Contact # [phone]

## 2023-11-13 NOTE — Anesthesia Postprocedure Evaluation (Signed)
 Anesthesia Post Note  Patient: Nicole Suarez  Procedure(s) Performed: ARTHROPLASTY, SHOULDER, TOTAL, REVERSE (Right: Shoulder)     Patient location during evaluation: PACU Anesthesia Type: General and Regional Level of consciousness: awake and alert Pain management: pain level controlled Vital Signs Assessment: post-procedure vital signs reviewed and stable Respiratory status: spontaneous breathing, nonlabored ventilation and respiratory function stable Cardiovascular status: blood pressure returned to baseline and stable Postop Assessment: no apparent nausea or vomiting Anesthetic complications: no  No notable events documented.  Last Vitals:  Vitals:   11/13/23 1000 11/13/23 1015  BP: (!) 121/99 (!) 130/52  Pulse: 67 71  Resp: 19 16  Temp:    SpO2: 91% 93%    Last Pain:  Vitals:   11/13/23 1015  TempSrc:   PainSc: 0-No pain                 Aloysuis Ribaudo,W. EDMOND

## 2023-11-13 NOTE — Evaluation (Signed)
 Occupational Therapy Evaluation Patient Details Name: Nicole Suarez MRN: [id number] DOB: 01-06-1941 Today's Date: 11/13/2023   History of Present Illness   Nicole Suarez is an 83 y.o. female s/p R Reverse TSA. PMH: L Reverse TSA 2023, OA, GERD, HTN, RLS, PONV     Clinical Impressions PTA pt lives alone and was independent prior to today's surgery but daughter lives nearby and can provide 24/7 A and supervision. Daughter present for all training.  Education completed regarding compensatory strategies for ADL tasks and functional mobility, management of sling, R ROM per specified parameters in the order set as indicated below, positioning of operative arm in sitting and supine and edema control, including use of "Iceman" Cold Therapy machine. Caregiver present for education, written handouts provided and reviewed using Teach Back and pt/caregiver verbalized/demonstrated understanding. Due to the below listed deficits, pt requires minimal assistance with ADL tasks and is modified independent for even terrain indoor amb but educated on strategies to assist with functional mobility if needed. Caregiver will be able to provide necessary level of assistance at discharge. Pt to follow up with MD to progress rehab of the operative shoulder.      If plan is discharge home, recommend the following:   A little help with walking and/or transfers;A little help with bathing/dressing/bathroom;Assistance with cooking/housework;Direct supervision/assist for medications management;Direct supervision/assist for financial management;Assist for transportation;Help with stairs or ramp for entrance     Functional Status Assessment   Patient has had a recent decline in their functional status and demonstrates the ability to make significant improvements in function in a reasonable and predictable amount of time.     Equipment Recommendations   None recommended by OT      Precautions/Restrictions    Precautions Precautions: Shoulder Shoulder FF 0-60; ER 0-20; Abd 0-45. ROM for ADL purposes only; AROM elbow/wrist/hand; OK for lap slides and pendulums; Loosen sling from neck when arm is supported in sitting  Required Braces or Orthoses: Sling Restrictions Weight Bearing Restrictions Per Provider Order: Yes RUE Weight Bearing Per Provider Order: Non weight bearing     Mobility Bed Mobility               General bed mobility comments: patient up in recliner, OT instructed in bed mobility re: sling and pillow support    Transfers Overall transfer level: Independent                 General transfer comment: ambuated to and from pacu bathroom      Balance Overall balance assessment: No apparent balance deficits (not formally assessed)                                         ADL either performed or assessed with clinical judgement   ADL Overall ADL's : Needs assistance/impaired    Per orders, R shoulder parameters as follows for ADL tasks: Abd 0-45; ER 0-20; FF 0-60; While moving within specified parameters, pt/caregiver instructed on bathing and how to donn/doff shirt, placing operative arm through sleeve first when donning and off last when doffing.Pt/caregiver educated on compensatory strategies for LB ADL and strategies to reduce risk of falls.  Pt/caregiver educated on donning/doffing sling and to wear the sling at all times with the exception of ADL, and to loosen the neck strap of the sling when the operative arm is in a supported position when sitting. In sitting or  supine, pt instructed to have a pillow behind and under their operative arm to provide support. If assist needed with ambulation, caregiver educated on the importance of walking on pt's non-operative side.  Education regarding use of "IceMan" Cold Therapy completed, including the importance of using a barrier on the shoulder prior to positioning the wrap-on pad. Pt/caregiver  verbalized/demonstrated understanding. Teach Back used while caregiver assisted with dressing pt and positioning "wrap-on pad" to facilitate DC. Daughter with + teach back assisting patient to use bathroom and ordered button hook AD for increased dexterity from baseline buttoning difficulty.                                           Vision Baseline Vision/History: 0 No visual deficits;1 Wears glasses Ability to See in Adequate Light: 0 Adequate Patient Visual Report: No change from baseline Vision Assessment?: No apparent visual deficits     Perception Perception: Within Functional Limits       Praxis Praxis: WFL       Pertinent Vitals/Pain Pain Assessment Pain Assessment: 0-10 Pain Score: 0-No pain     Extremity/Trunk Assessment Upper Extremity Assessment Upper Extremity Assessment: Right hand dominant;RUE deficits/detail RUE Deficits / Details: R post op N block remains active, AAROM R elbow, wrist, hand WFL's   Lower Extremity Assessment Lower Extremity Assessment: Overall WFL for tasks assessed   Cervical / Trunk Assessment Cervical / Trunk Assessment: Normal   Communication Communication Communication: No apparent difficulties   Cognition Arousal: Alert Behavior During Therapy: WFL for tasks assessed/performed Cognition: No apparent impairments                               Following commands: Intact       Cueing  General Comments   Cueing Techniques: Verbal cues  R shoulder post op dressing intact   Exercises Exercises: Shoulder Shoulder Exercises Pendulum Exercise: Right, 10 reps Elbow Flexion: 10 reps, Self ROM Elbow Extension: 10 reps, Self ROM Wrist Flexion: Self ROM, Right, 10 reps Wrist Extension: Self ROM, 10 reps, Right Digit Composite Flexion: Right, 10 reps, Self ROM Composite Extension: Self ROM, 10 reps, Right   Shoulder Instructions Shoulder Instructions Donning/doffing shirt without moving shoulder:  Caregiver independent with task;Modified independent;Patient able to independently direct caregiver Method for sponge bathing under operated UE: Caregiver independent with task;Patient able to independently direct caregiver Donning/doffing sling/immobilizer: Caregiver independent with task;Patient able to independently direct caregiver Correct positioning of sling/immobilizer: Caregiver independent with task;Patient able to independently direct caregiver Pendulum exercises (written home exercise program): Caregiver independent with task;Patient able to independently direct caregiver ROM for elbow, wrist and digits of operated UE: Caregiver independent with task;Patient able to independently direct caregiver Sling wearing schedule (on at all times/off for ADL's): Caregiver independent with task;Patient able to independently direct caregiver Proper positioning of operated UE when showering: Caregiver independent with task;Patient able to independently direct caregiver Positioning of UE while sleeping: Caregiver independent with task;Patient able to independently direct caregiver    Home Living Family/patient expects to be discharged to:: Private residence Living Arrangements: Alone Available Help at Discharge: Family;Available 24 hours/day Type of Home: House Home Access: Level entry     Home Layout: Two level;Able to live on main level with bedroom/bathroom     Bathroom Shower/Tub: Walk-in shower;Door   Insurance claims handler Accessibility:  Yes How Accessible: Accessible via walker Home Equipment: Grab bars - toilet;Grab bars - tub/shower;Cane - single point;Hand held shower head;Adaptive equipment Adaptive Equipment: Reacher        Prior Functioning/Environment Prior Level of Function : Independent/Modified Independent                    OT Problem List: Decreased coordination;Impaired UE functional use    AM-PAC OT "6 Clicks" Daily Activity     Outcome  Measure Help from another person eating meals?: None Help from another person taking care of personal grooming?: None Help from another person toileting, which includes using toliet, bedpan, or urinal?: A Little Help from another person bathing (including washing, rinsing, drying)?: A Little Help from another person to put on and taking off regular upper body clothing?: A Little Help from another person to put on and taking off regular lower body clothing?: A Little 6 Click Score: 20   End of Session Equipment Utilized During Treatment: Gait belt Nurse Communication: Mobility status  Activity Tolerance: Patient tolerated treatment well Patient left: in chair;with call bell/phone within reach;with family/visitor present  OT Visit Diagnosis:  (UE dysfunction)                Time: 1115-1200 OT Time Calculation (min): 45 min Charges:  OT General Charges $OT Visit: 1 Visit OT Evaluation $OT Eval Low Complexity: 1 Low OT Treatments $Self Care/Home Management : 8-22 mins $Therapeutic Exercise: 8-22 mins Nicole Suarez OT/L Acute Rehabilitation Department  [phone]  11/13/2023, 12:15 PM

## 2023-11-13 NOTE — Anesthesia Procedure Notes (Addendum)
 Procedure Name: Intubation Date/Time: 11/13/2023 7:41 AM  Performed by: Terrilee Few, RNPre-anesthesia Checklist: Patient identified, Emergency Drugs available, Suction available, Patient being monitored and Timeout performed Patient Re-evaluated:Patient Re-evaluated prior to induction Oxygen Delivery Method: Circle system utilized Preoxygenation: Pre-oxygenation with 100% oxygen Induction Type: IV induction Ventilation: Mask ventilation without difficulty Laryngoscope Size: Mac and 3 Grade View: Grade I Tube type: Oral Tube size: 7.0 mm Number of attempts: 1 Airway Equipment and Method: Stylet Placement Confirmation: ETT inserted through vocal cords under direct vision, positive ETCO2 and breath sounds checked- equal and bilateral Secured at: 21 cm Tube secured with: Tape Dental Injury: Teeth and Oropharynx as per pre-operative assessment

## 2023-11-13 NOTE — Discharge Instructions (Signed)
 Nicole Suarez, M.D., F.A.A.O.S. [facility] Specializing in Arthroscopic and Reconstructive Surgery of the Shoulder [phone] [address] [place], [place] [address] - Fax [phone]    POST-OP REVERSE TOTAL SHOULDER REPLACEMENT INSTRUCTIONS  1. Your first follow up appointment is:  2. The bandage over your incision is waterproof. You may begin showering with this dressing on immediately but do not submerge in a bath tub or hot tub. You may leave this dressing on until first follow up appointment in around 2 weeks. We prefer you leave this dressing in place until follow up however after 5-7 days if you are having itching or skin irritation and would like to remove it you may do so. Go slow and tug at the borders gently to break the bond the dressing has with the skin. At this point if there is no drainage it is okay to go without a bandage or you may cover it with a light gauze and tape. Leave your steri-strips across your incision. You can also expect significant bruising around your shoulder that will drift down your arm and into your chest wall. This is very normal and should resolve over several days. It is also very normal to have some swelling to the operative extremity that will involve the forearm and hand and fingers. It is recommended that if you notice this then try to elevate your hand and fist above your heart and perform gripping exercises several times an hour to help reduce this swelling (see #4).   3. Wear your sling if you are going to be up walking around and when you go to sleep at night. Also protect the arm in the sling until your nerve block has worn off. Then it is ok to remove the sling to perform the exercises below or to occasionally let your arm dangle by your side to stretch your elbow. It is ok to remove your sling if you are sitting in a controlled environment and allow your arm to rest in a position of comfort by your side or on your  lap with pillows to give your neck and skin a break from the sling. You may remove it to allow arm to dangle by side to shower. It is also ok to use your operative extremity to help do light waist level activities and to assist with hygiene and ADL's such as brushing your teeth, getting dressed and toileting. We do ask you refrain from reaching behind your body or back. Also do not use the operative arm to support body weight when getting up or down from bed or a chair.  4. Range of motion to your elbow, wrist, and hand are encouraged 3-5 times daily. Exercise to your hand and fingers helps to reduce swelling you may experience. If you have a foam ball you can use this. If not you can simply use a washcloth to squeeze.   5. Utilize the ice machine as much as possible over the first several days, but it is OK to remove to get up to walk around several times a day to stretch your legs and to go to the bathroom. When in the ice machine, please make sure there is a layer of clothing between the sleeve and your skin and check your skin frequently to make sure there is no redness or skin irritation, if you notice this then give your skin a break from the ice machine for several hours. After the first 3-4 days you  can gradually reduce how much time you spend in the ice machine and adjust to your level of pain. In general it is a good rule of thumb that if you are experiencing pain you should be using the ice machine.  6. Prescriptions for a pain medication and a muscle relaxant are provided for you. It is recommended that if you are experiencing pain that your pain medication alone is not controlling, add the muscle relaxant along with the pain medication which can give additional pain relief. The first 1-2 days after your block has worn off is generally the most severe of your pain and then should gradually decrease. As your pain lessens it is recommended that you decrease your use of the pain medications to an "as  needed basis'" only and to always comply with the recommended dosages of the pain medications. It is also ok to utilize over the counter form of pain medicines such as acetaminophen instead of the prescription pain medications if your pain is only mild. But do not use the prescription pain med and acetaminophen at the same time, it is either one or the other, not both.  7. Pain medications can produce constipation along with their use. If you experience this, the use of an over the counter stool softener or laxative daily is recommended.   8. For additional questions or concerns, please do not hesitate to call the office. If after hours there is an answering service to forward your concerns to the physician or physician assistant on call.  9. Pain control following an exparel nerve block:  To help control your post-operative pain you received a nerve block  performed with Exparel which is a long acting anesthetic (numbing agent) which can provide pain relief and sensations of numbness (and relief of pain) in the operative shoulder and arm for up to 3 days. Sometimes it provides mixed relief, meaning you may still have numbness in certain areas of the arm but can still be able to move parts of that arm, hand, and fingers. We recommend that your prescribed pain medications be used as needed. We do not feel it is necessary to "pre medicate" and "stay ahead" of pain.  Taking narcotic pain medications when you are not having any pain can lead to unnecessary and potentially dangerous side effects.  While the effects of the nerve block are present, please be aware that while you do not have sensation we recommend you pay careful attention to keep your arm positioned in a protective way. Also if you have a sling that has a "thumb loop" that helps to keep the sling from sliding backwards, make sure you remove the loop to avoid a constant pressure to the thumb which can cause some nerve damage or skin  breakdown.  10. Most people find it more comfortable to sleep in a semi upright position or in a recliner with the arm supported. Again, we do recommend you wear your sling to sleep. It is also ok to try to sleep lying flat in bed with a pillow behind your arm to keep it from sliding or falling backwards. If you are trying to sleep on the non-operative side, please use pillows to position your arm so that it does not slide forwards or backwards. It is very common that sleeping and getting into a comfortable position is difficult after shoulder surgery, this should improve with time.  11. Dressing - It is recommended you wear shirts that are loose or button up the  front. To put shirt on allow operative arm to dangle by your side and slide the shirt on that arm, then your head and then the non-operative arm. To remove the shirt do this sequence in reverse. We recommend you wear the sling over top of your shirt to prevent skin irritation. If you notice irritation in your axilla (arm pit) please try to elevate arm away from your body to allow air to get in this area and consider use of an over the counter ointment such as hydrocortisone or simple lotion.  FOR ADDITIONAL INFO ON THE DONJOY ICE MACHINE AND INSTRUCTIONS GO TO THE WEBSITE AT  [email or web address]   I  12.  We recommend that you avoid any dental work or cleaning in the first 3 months following your joint replacement. This is to help minimize the possibility of infection from the bacteria in your mouth that enters your bloodstream during dental work. We also recommend that you take an antibiotic prior to your dental work for the first year after your shoulder replacement to further help reduce that risk. Please simply contact our office for antibiotics to be sent to your pharmacy prior to dental work.  13. Post Op Exercises: Please see attached sheet with diagram     Nicole Suarez, M.D.,  F.A.A.O.S. [facility]opaedic Surgery Specializing in Arthroscopic and Reconstructive Surgery of the Shoulder [phone] [address] [place], [place] [address] - Fax [phone]    POST-OP REVERSE TOTAL SHOULDER REPLACEMENT INSTRUCTIONS  1. Your first follow up appointment is:  2. The bandage over your incision is waterproof. You may begin showering with this dressing on immediately but do not submerge in a bath tub or hot tub. You may leave this dressing on until first follow up appointment in around 2 weeks. We prefer you leave this dressing in place until follow up however after 5-7 days if you are having itching or skin irritation and would like to remove it you may do so. Go slow and tug at the borders gently to break the bond the dressing has with the skin. At this point if there is no drainage it is okay to go without a bandage or you may cover it with a light gauze and tape. Leave your steri-strips across your incision. You can also expect significant bruising around your shoulder that will drift down your arm and into your chest wall. This is very normal and should resolve over several days. It is also very normal to have some swelling to the operative extremity that will involve the forearm and hand and fingers. It is recommended that if you notice this then try to elevate your hand and fist above your heart and perform gripping exercises several times an hour to help reduce this swelling (see #4).   3. Wear your sling if you are going to be up walking around and when you go to sleep at night. Also protect the arm in the sling until your nerve block has worn off. Then it is ok to remove the sling to perform the exercises below or to occasionally let your arm dangle by your side to stretch your elbow. It is ok to remove your sling if you are sitting in a controlled environment and allow your arm to rest in a position of comfort by your side or on your lap with pillows to give your  neck and skin a break from the sling. You may remove it to allow arm to dangle by side  to shower. It is also ok to use your operative extremity to help do light waist level activities and to assist with hygiene and ADL's such as brushing your teeth, getting dressed and toileting. We do ask you refrain from reaching behind your body or back. Also do not use the operative arm to support body weight when getting up or down from bed or a chair.  4. Range of motion to your elbow, wrist, and hand are encouraged 3-5 times daily. Exercise to your hand and fingers helps to reduce swelling you may experience. If you have a foam ball you can use this. If not you can simply use a washcloth to squeeze.   5. Utilize the ice machine as much as possible over the first several days, but it is OK to remove to get up to walk around several times a day to stretch your legs and to go to the bathroom. When in the ice machine, please make sure there is a layer of clothing between the sleeve and your skin and check your skin frequently to make sure there is no redness or skin irritation, if you notice this then give your skin a break from the ice machine for several hours. After the first 3-4 days you can gradually reduce how much time you spend in the ice machine and adjust to your level of pain. In general it is a good rule of thumb that if you are experiencing pain you should be using the ice machine.  6. Prescriptions for a pain medication and a muscle relaxant are provided for you. It is recommended that if you are experiencing pain that your pain medication alone is not controlling, add the muscle relaxant along with the pain medication which can give additional pain relief. The first 1-2 days after your block has worn off is generally the most severe of your pain and then should gradually decrease. As your pain lessens it is recommended that you decrease your use of the pain medications to an "as needed basis'" only and to  always comply with the recommended dosages of the pain medications. It is also ok to utilize over the counter form of pain medicines such as acetaminophen instead of the prescription pain medications if your pain is only mild. But do not use the prescription pain med and acetaminophen at the same time, it is either one or the other, not both.  7. Pain medications can produce constipation along with their use. If you experience this, the use of an over the counter stool softener or laxative daily is recommended.   8. For additional questions or concerns, please do not hesitate to call the office. If after hours there is an answering service to forward your concerns to the physician or physician assistant on call.  9. Pain control following an exparel nerve block:  To help control your post-operative pain you received a nerve block  performed with Exparel which is a long acting anesthetic (numbing agent) which can provide pain relief and sensations of numbness (and relief of pain) in the operative shoulder and arm for up to 3 days. Sometimes it provides mixed relief, meaning you may still have numbness in certain areas of the arm but can still be able to move parts of that arm, hand, and fingers. We recommend that your prescribed pain medications be used as needed. We do not feel it is necessary to "pre medicate" and "stay ahead" of pain.  Taking narcotic pain medications when you are  not having any pain can lead to unnecessary and potentially dangerous side effects.  While the effects of the nerve block are present, please be aware that while you do not have sensation we recommend you pay careful attention to keep your arm positioned in a protective way. Also if you have a sling that has a "thumb loop" that helps to keep the sling from sliding backwards, make sure you remove the loop to avoid a constant pressure to the thumb which can cause some nerve damage or skin breakdown.  10. Most people find it  more comfortable to sleep in a semi upright position or in a recliner with the arm supported. Again, we do recommend you wear your sling to sleep. It is also ok to try to sleep lying flat in bed with a pillow behind your arm to keep it from sliding or falling backwards. If you are trying to sleep on the non-operative side, please use pillows to position your arm so that it does not slide forwards or backwards. It is very common that sleeping and getting into a comfortable position is difficult after shoulder surgery, this should improve with time.  11. Dressing - It is recommended you wear shirts that are loose or button up the front. To put shirt on allow operative arm to dangle by your side and slide the shirt on that arm, then your head and then the non-operative arm. To remove the shirt do this sequence in reverse. We recommend you wear the sling over top of your shirt to prevent skin irritation. If you notice irritation in your axilla (arm pit) please try to elevate arm away from your body to allow air to get in this area and consider use of an over the counter ointment such as hydrocortisone or simple lotion.  FOR ADDITIONAL INFO ON THE DONJOY ICE MACHINE AND INSTRUCTIONS GO TO THE WEBSITE AT  [email or web address]   I  12.  We recommend that you avoid any dental work or cleaning in the first 3 months following your joint replacement. This is to help minimize the possibility of infection from the bacteria in your mouth that enters your bloodstream during dental work. We also recommend that you take an antibiotic prior to your dental work for the first year after your shoulder replacement to further help reduce that risk. Please simply contact our office for antibiotics to be sent to your pharmacy prior to dental work.  13. Post Op Exercises: Please see attached sheet with diagram

## 2023-11-13 NOTE — Op Note (Signed)
 11/13/2023  9:15 AM  PATIENT:   Nicole Suarez  83 y.o. female  PRE-OPERATIVE DIAGNOSIS:  Right Rotatorcuff  tear arthropathy  POST-OPERATIVE DIAGNOSIS: Same  PROCEDURE: Right shoulder reverse arthroplasty lysing a press-fit size 11 Arthrex stem with a neutral metathesis, +3 constrained polyethylene insert, 36/+4 glenosphere and a small/+2 baseplate  SURGEON:  Kealey Kemmer, Genevieve Kerry M.D.  ASSISTANTS: Rand Burrs, PA-C  Rand Burrs, PA-C was utilized as an Geophysicist/field seismologist throughout this case, essential for help with positioning the patient, positioning extremity, tissue manipulation, implantation of the prosthesis, suture management, wound closure, and intraoperative decision-making.  ANESTHESIA:   General endotracheal and interscalene block with Exparel   EBL: 250 cc  SPECIMEN: None  Drains: None   PATIENT DISPOSITION:  PACU - hemodynamically stable.    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  Due to her increasing functional limitations and failure to respond to prolonged attempts at conservative management, she is brought to the operating this time for planned right shoulder reverse arthroplasty.  Preoperatively, I counseled the patient regarding treatment options and risks versus benefits thereof.  Possible surgical complications were all reviewed including potential for bleeding, infection, neurovascular injury, persistent pain, loss of motion, anesthetic complication, failure of the implant, and possible need for additional surgery. They understand and accept and agrees with our planned procedure.   Procedure in detail:  After undergoing routine preop evaluation the patient received prophylactic  antibiotics and interscalene block with Exparel  was established in the holding area by the anesthesia department.  Placed supine on the operating table and underwent the smooth induction of a general endotracheal anesthesia.  Placed into the beachchair position and appropriately padded and protected.  Right shoulder girdle region was sterilely prepped and draped in standard fashion.  Timeout was called.  A deltopectoral approach to the right shoulder was made through an approximately 8 cm incision.  Skin flaps were elevated dissection carried deeply the deltopectoral interval was then developed from proximal to distal with the vein taken laterally.  Conjoined tendon mobilized and retracted medially.  Adhesions were divided beneath the deltoid.  Long head bicep tendon was then tenodesed at the upper border the pectoralis major tendon with the proximal segment unroofed and excised.  Superior remnant of the rotator cuff was then split from the apex of the bicipital groove to the base of the coracoid and the subscap was separated from the lesser tuberosity using electrocautery and the margin was tagged with a pair of grasping suture tape sutures.  Capsular attachments were then divided from the anterior and inferior margins of the humeral neck allowing deliver the humeral head through the wound.  An extra medullary guide was then used to outline our proposed humeral head resection which we performed with an oscillating saw to approximate 20 degrees of retroversion.  Marginal osteophytes were then removed with a rondure and a metal cap was placed over the cut proximal humeral surface.  The glenoid was then exposed and a circumferential labral resection was performed.  A guidepin was then directed into the center of the glenoid and the glenoid was reamed with the central followed by the peripheral reamer to a stable subchondral bony bed.  Preparation completed the drill and tapped for a 30 mm lag screw.  Our baseplate was  assembled and then inserted with vancomycin  powder applied to the threads of the lag screw and excellent fixation was achieved.  All of the peripheral locking screws were then placed using standard technique with excellent fixation.  A 36/+4 glenosphere was then impacted onto the baseplate and a central locking screw was placed.  We returned attention to the humeral canal where the canal was opened by hand reaming and we ultimately broached up to a size 11 stem which matched the opposite side.  A neutral metaphyseal reaming guide was then used to prepare the metaphysis.  A trial implant was then placed and a trial reduction was then performed showing good motion stability and soft tissue balance.  The trial was then removed.  A final implant was assembled.  It was inserted with vancomycin  powder applied into the humeral canal and excellent fixation was achieved.  Trial reductions were then performed again the +3 poly gave is the best motion stability and soft tissue balance.  Trial was then removed.  We did use a +3 constrained poly which was impacted of the implant  Final reduction again showed excellent motion stability and soft tissue balance all much to our satisfaction.  The joint was then copiously irrigated.  Final hemostasis was obtained.  The subscapularis was mobilized and repaired back to the eyelets on the collar of the implant using the previously placed suture tape sutures.  The deltopectoral interval was reapproximated with a series of figure-of-eight number Vicryl sutures.  2-0 Monocryl used to close the subcu layer and intracuticular 3-0 Monocryl used to close the skin followed by Dermabond and Aquacel dressing.  The right arm was placed into a sling.  The patient was awakened, extubated, and taken to the recovery in stable condition.  Glo Larch MD   Contact # [phone]

## 2023-11-14 ENCOUNTER — Encounter (HOSPITAL_COMMUNITY): Payer: Self-pay | Admitting: Orthopedic Surgery

## 2023-11-17 ENCOUNTER — Ambulatory Visit: Admitting: Internal Medicine

## 2023-11-17 ENCOUNTER — Encounter: Payer: Self-pay | Admitting: Internal Medicine

## 2023-11-17 VITALS — BP 118/80 | HR 88 | Temp 98.0°F | Ht 64.5 in | Wt 192.0 lb

## 2023-11-17 DIAGNOSIS — R509 Fever, unspecified: Secondary | ICD-10-CM | POA: Diagnosis not present

## 2023-11-17 LAB — POC URINALSYSI DIPSTICK (AUTOMATED)
Bilirubin, UA: NEGATIVE
Glucose, UA: NEGATIVE
Ketones, UA: NEGATIVE
Leukocytes, UA: NEGATIVE
Nitrite, UA: NEGATIVE
Protein, UA: NEGATIVE
Spec Grav, UA: 1.01 (ref 1.010–1.025)
Urobilinogen, UA: 0.2 U/dL
pH, UA: 6.5 (ref 5.0–8.0)

## 2023-11-17 MED ORDER — CEPHALEXIN 500 MG PO CAPS
500.0000 mg | ORAL_CAPSULE | Freq: Three times a day (TID) | ORAL | 1 refills | Status: AC
Start: 2023-11-17 — End: ?

## 2023-11-17 NOTE — Assessment & Plan Note (Addendum)
 Post op shoulder replacement Hard to tell if this is infected or just post op inflammation---but seems that it could be infected Urinalysis is normal--but may be due to taking antibiotic already Will continue the cephalexin  500mg  tid--- for at least a week Has surgical follow up next week  Hold the Hiprex while on

## 2023-11-17 NOTE — Progress Notes (Signed)
 Subjective:    Patient ID: Nicole Suarez, female    DOB: 1940-10-01, 83 y.o.   MRN: [id number]  HPI Here due to fever and concern for a UTI With daughter  Had reverse right shoulder replacement 5/1 Fever up to 103 2 days ago---then 101.9 yesterday Some disorientation yesterday---but only took 2 percocet since the surgery (and tylenol  and flexeril ) No redness or discharge from surgical site--but was swollen and warm yesterday (despite the ice machine)--but told this could be normal from surgeon's office Some cough at first--did incentive spirometry ---also breathing fast Has had urinary frequency--some urge incontinence (this is new) No dysuria (never does)---no blood  Had some left over keflex  from urologist---took 2 last night No fever this morning  Current Outpatient Medications on File Prior to Visit  Medication Sig Dispense Refill   amLODipine  (NORVASC ) 5 MG tablet Take 1 tablet (5 mg total) by mouth daily. 90 tablet 3   amoxicillin  (AMOXIL ) 500 MG capsule Take 2,000 mg by mouth See admin instructions. Take 2000 mg by mouth 1 hour prior to dental appointments     Ascorbic Acid (VITAMIN C PO) Take 1 tablet by mouth in the morning and at bedtime.     bromocriptine  (PARLODEL ) 2.5 MG tablet Take 1 tablet (2.5 mg total) by mouth daily. 90 tablet 3   Calcium  Carb-Cholecalciferol (CALCIUM  + D3 PO) Take 1 tablet by mouth in the morning.     carvedilol  (COREG ) 12.5 MG tablet TAKE 1 TABLET TWICE A DAY WITH MEALS 180 tablet 3   chlorthalidone  (HYGROTON ) 25 MG tablet TAKE ONE-HALF (1/2) TABLET DAILY 45 tablet 3   cholecalciferol (VITAMIN D3) 25 MCG (1000 UNIT) tablet Take 1,000 Units by mouth in the morning.     Coenzyme Q10 (COQ10 PO) Take 1 capsule by mouth 2 (two) times a week.     CRANBERRY-VITAMIN C-D MANNOSE PO Take 2 capsules by mouth in the morning and at bedtime.     cyclobenzaprine  (FLEXERIL ) 10 MG tablet Take 1 tablet (10 mg total) by mouth 3 (three) times daily as needed for  muscle spasms. 30 tablet 1   estradiol  (ESTRACE  VAGINAL) 0.1 MG/GM vaginal cream Pea sized amount to urethra/vulvar area twice weekly 42.5 g 1   fluticasone  (FLONASE ) 50 MCG/ACT nasal spray Place 2 sprays into both nostrils daily. (Patient taking differently: Place 2 sprays into both nostrils every evening.) 48 g 3   ibuprofen  (ADVIL ) 200 MG tablet Take 400-600 mg by mouth in the morning and at bedtime.     levocetirizine (XYZAL) 5 MG tablet Take 5 mg by mouth every evening.     Magnesium 250 MG CAPS Take 250 mg by mouth at bedtime.     methenamine (HIPREX) 1 g tablet Take 1 g by mouth 2 (two) times daily.     Multiple Vitamin (MULTIVITAMIN WITH MINERALS) TABS tablet Take 1 tablet by mouth in the morning.     Multiple Vitamins-Minerals (PRESERVISION AREDS 2 PO) Take 2 capsules by mouth in the morning.     ondansetron  (ZOFRAN ) 4 MG tablet Take 1 tablet (4 mg total) by mouth every 8 (eight) hours as needed for nausea or vomiting. 10 tablet 0   oxyCODONE -acetaminophen  (PERCOCET) 5-325 MG tablet Take 1 tablet by mouth every 4 (four) hours as needed (max 6 q). 20 tablet 0   pantoprazole  (PROTONIX ) 40 MG tablet TAKE 1 TABLET DAILY 90 tablet 1   Polyethyl Glycol-Propyl Glycol (SYSTANE OP) Place 1 drop into both eyes in the morning and  at bedtime.     polyethylene glycol powder (GLYCOLAX /MIRALAX ) 17 GM/SCOOP powder Take 8.5 g by mouth in the morning.     rosuvastatin  (CRESTOR ) 10 MG tablet TAKE 1 TABLET AT BEDTIME 90 tablet 3   senna (SENOKOT) 8.6 MG TABS tablet Take 1 tablet by mouth at bedtime.     TURMERIC PO Take 1 capsule by mouth 2 (two) times a week.     No current facility-administered medications on file prior to visit.    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    History of urinary incontinence    History of UTI    Hyperlipidemia    Hypertension    Neuromuscular disorder (HCC)    restless legs    PONV (postoperative nausea and vomiting)     Past Surgical History:  Procedure Laterality Date   APPENDECTOMY  1963   CARPAL TUNNEL RELEASE Bilateral    dequivan release   CATARACT EXTRACTION, BILATERAL     08/2019, 09/2019   CESAREAN SECTION  1964   MICRODISCECTOMY LUMBAR  2019   L3L4   RECTOCELE REPAIR  2009   REMOVAL OF URINARY SLING     REPLACEMENT TOTAL KNEE  2010   REVERSE SHOULDER ARTHROPLASTY Left 01/31/2022   Procedure: REVERSE SHOULDER ARTHROPLASTY;  Surgeon: Ellard Gunning, MD;  Location: [facility];  Service: Orthopedics;  Laterality: Left;   REVERSE SHOULDER ARTHROPLASTY Right 11/13/2023   Procedure: ARTHROPLASTY, SHOULDER, TOTAL, REVERSE;  Surgeon: Ellard Gunning, MD;  Location: [facility];  Service: Orthopedics;  Laterality: Right;  exparel    ROTATOR CUFF REPAIR  2000   urterine suspension  1971    Family History  Problem Relation Age of Onset   Early death Mother    Cancer Father    COPD Father    Lung cancer Father    Pancreatic cancer Sister    Alcohol abuse Brother    Heart disease Brother    Hypertension Brother    COPD Maternal Grandmother    Colon cancer Neg Hx    Esophageal cancer Neg Hx    Stomach cancer Neg Hx    Rectal cancer Neg Hx    Colon polyps Neg Hx     Social History   Socioeconomic History   Marital status: Widowed    Spouse name: Not on file   Number of children: Not on file   Years of education: Not on file   Highest education level:  Associate degree: occupational, Scientist, product/process development, or vocational program  Occupational History   Occupation: retired Charity fundraiser  Tobacco Use   Smoking status: Never   Smokeless tobacco: Never  Vaping Use   Vaping status: Never Used  Substance and Sexual Activity   Alcohol use: Never   Drug use: Never   Sexual activity: Not on file  Other Topics Concern  Not on file  Social History Narrative   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    Moved her from [place] in 2020   Cares for disabled husband   Lives in [place]    Social Drivers of Health   Financial Resource Strain: Low Risk  (07/28/2023)   Overall Financial Resource Strain (CARDIA)    Difficulty of Paying Living Expenses: Not very hard  Food Insecurity: Low Risk  (11/12/2023)   Received from [facility]   Hunger Vital Sign    Worried About Running Out of Food in the Last Year: Never true    Ran Out of Food in the Last Year: Never true  Transportation Needs: No Transportation Needs (11/12/2023)   Received from [facility]    In the past 12 months, has lack of reliable transportation kept you from medical appointments, meetings, work or from getting things needed for daily living? : No  Physical Activity: Inactive (07/28/2023)   Exercise Vital Sign    Days of Exercise per Week: 0 days    Minutes of Exercise per Session: 10 min  Stress: No Stress Concern Present (07/28/2023)   [employer] of Occupational Health - Occupational Stress Questionnaire    Feeling of Stress : Not at all  Social Connections: Moderately Integrated (07/28/2023)   Social Connection and Isolation Panel [NHANES]    Frequency of Communication with Friends and Family: Three times a week    Frequency of Social Gatherings with Friends and Family: Twice a week    Attends Religious Services: More than 4 times per year    Active Member of [employer] or Organizations: Yes    Attends Banker Meetings: More than 4 times per year    Marital Status:  Widowed  Intimate Partner Violence: Not At Risk (05/30/2021)   Humiliation, Afraid, Rape, and Kick questionnaire    Fear of Current or Ex-Partner: No    Emotionally Abused: No    Physically Abused: No    Sexually Abused: No   Review of Systems No N/V Eating okay Remains constipated--trying some senna     Objective:   Physical Exam Constitutional:      Appearance: Normal appearance.  Cardiovascular:     Rate and Rhythm: Normal rate and regular rhythm.     Heart sounds: No murmur heard.    No gallop.  Pulmonary:     Effort: Pulmonary effort is normal.     Breath sounds: Normal breath sounds. No wheezing or rales.  Abdominal:     Palpations: Abdomen is soft.     Tenderness: There is no abdominal tenderness. There is no guarding.  Skin:    Comments: Surgical dressing in place below right shoulder Redness and warmth diffusely below this--?some tenderness  Neurological:     Mental Status: She is alert.            Assessment & Plan:

## 2023-11-17 NOTE — Addendum Note (Signed)
 Addended by: Franne Ivory on: 11/17/2023 02:28 PM   Modules accepted: Orders

## 2023-11-19 ENCOUNTER — Ambulatory Visit (INDEPENDENT_AMBULATORY_CARE_PROVIDER_SITE_OTHER): Admitting: Family Medicine

## 2023-11-19 ENCOUNTER — Encounter: Payer: Self-pay | Admitting: Family Medicine

## 2023-11-19 VITALS — BP 136/62 | HR 78 | Temp 97.8°F | Ht 64.0 in | Wt 192.1 lb

## 2023-11-19 DIAGNOSIS — I10 Essential (primary) hypertension: Secondary | ICD-10-CM | POA: Diagnosis not present

## 2023-11-19 DIAGNOSIS — Z96611 Presence of right artificial shoulder joint: Secondary | ICD-10-CM | POA: Diagnosis not present

## 2023-11-19 DIAGNOSIS — Z96619 Presence of unspecified artificial shoulder joint: Secondary | ICD-10-CM | POA: Insufficient documentation

## 2023-11-19 DIAGNOSIS — M7989 Other specified soft tissue disorders: Secondary | ICD-10-CM | POA: Insufficient documentation

## 2023-11-19 DIAGNOSIS — K5903 Drug induced constipation: Secondary | ICD-10-CM

## 2023-11-19 DIAGNOSIS — K5900 Constipation, unspecified: Secondary | ICD-10-CM | POA: Insufficient documentation

## 2023-11-19 LAB — BASIC METABOLIC PANEL WITH GFR
BUN: 12 mg/dL (ref 6–23)
CO2: 31 meq/L (ref 19–32)
Calcium: 9.5 mg/dL (ref 8.4–10.5)
Chloride: 98 meq/L (ref 96–112)
Creatinine, Ser: 0.61 mg/dL (ref 0.40–1.20)
GFR: 84.32 mL/min (ref >60.00)
Glucose, Bld: 104 mg/dL — ABNORMAL HIGH (ref 70–99)
Potassium: 3.6 meq/L (ref 3.5–5.1)
Sodium: 137 meq/L (ref 135–145)

## 2023-11-19 LAB — CBC WITH DIFFERENTIAL/PLATELET
Basophils Absolute: 0 10*3/uL (ref 0.0–0.1)
Basophils Relative: 0.3 % (ref 0.0–3.0)
Eosinophils Absolute: 0.2 10*3/uL (ref 0.0–0.7)
Eosinophils Relative: 2.4 % (ref 0.0–5.0)
HCT: 35.1 % — ABNORMAL LOW (ref 36.0–46.0)
Hemoglobin: 11.9 g/dL — ABNORMAL LOW (ref 12.0–15.0)
Lymphocytes Relative: 23.3 % (ref 12.0–46.0)
Lymphs Abs: 1.7 10*3/uL (ref 0.7–4.0)
MCHC: 33.8 g/dL (ref 30.0–36.0)
MCV: 89.4 fl (ref 78.0–100.0)
Monocytes Absolute: 0.7 10*3/uL (ref 0.1–1.0)
Monocytes Relative: 9.1 % (ref 3.0–12.0)
Neutro Abs: 4.8 10*3/uL (ref 1.4–7.7)
Neutrophils Relative %: 64.9 % (ref 43.0–77.0)
Platelets: 317 10*3/uL (ref 150.0–400.0)
RBC: 3.93 Mil/uL (ref 3.87–5.11)
RDW: 14.1 % (ref 11.5–15.5)
WBC: 7.3 10*3/uL (ref 4.0–10.5)

## 2023-11-19 NOTE — Assessment & Plan Note (Signed)
 Likely due to post operative narcotic pain medication  Will continue miralax  and fluids (high fiber diet if possible) Update if not starting to improve in a week or if worsening

## 2023-11-19 NOTE — Assessment & Plan Note (Addendum)
 S/p right reverse shoulder replacement  Reviewed available ortho notes  Redness is improved with antibiotic and no more fever  Encouraged follow up with her ortho surgeon

## 2023-11-19 NOTE — Patient Instructions (Addendum)
 Blood pressure is better  Continue to hold the amlodipine   Labs today  Continue the antibiotics and watch the arm closely  Follow up with orthopedics as planned    Continue miralax  for constipation  Keep up fluids    Follow up in 4-6 weeks (or earlier if needed)

## 2023-11-19 NOTE — Assessment & Plan Note (Signed)
 Pt had fever and arm redness post osteoporosis  Treated with keflex  by Dr Joelle Musca when unable to see ortho  Overall doing better Less redness Still swollen  Recommend finish antibiotic and follow up with ortho as planned

## 2023-11-19 NOTE — Progress Notes (Signed)
 Subjective:    Patient ID: Nicole Suarez, female    DOB: Feb 05, 1941, 83 y.o.   MRN: [id number]  HPI  Wt Readings from Last 3 Encounters:  11/19/23 192 lb 2 oz (87.1 kg)  11/17/23 192 lb (87.1 kg)  11/13/23 180 lb (81.6 kg)   32.98 kg/m  Vitals:   11/19/23 1221  BP: 136/62  Pulse: 78  Temp: 97.8 F (36.6 C)  SpO2: 98%    Pt presents for c/o low blood pressure   Also to check arm    HTN bp is stable today , but has been low  No cp or palpitations or headaches or edema  No side effects to medicines  BP Readings from Last 3 Encounters:  11/19/23 136/62  11/17/23 118/80  11/13/23 125/74     Was taking  Carvedilol  12.5 mg bid Chlorthalidone  12.5 mg daily  Amlodipine  5 mg daily   Very tired Blood pressure was low  Held her amlodipine      Constipated -using senna and miralax     Saw Dr Letvak for skin concern from shoulder replacement recently (surgery on 5/1) Had fever of 103.2  ? If infected He treated with cephalexin  500 mg tid  Used incentive spirometer   ? If possible uti but uti neg  Surgical follow up has not happened yet  Has appointment on 5/14  Dr Alfredo Ano    Lab Results  Component Value Date   WBC 5.9 11/07/2023   HGB 13.7 11/07/2023   HCT 41.7 11/07/2023   MCV 92.1 11/07/2023   PLT 243 11/07/2023   Lab Results  Component Value Date   NA 139 11/07/2023   K 3.3 (L) 11/07/2023   CO2 27 11/07/2023   GLUCOSE 91 11/07/2023   BUN 15 11/07/2023   CREATININE 0.65 11/07/2023   CALCIUM  9.7 11/07/2023   GFR 70.81 05/13/2023   GFRNONAA >60 11/07/2023   Lab Results  Component Value Date   ALT 18 05/13/2023   AST 21 05/13/2023   ALKPHOS 62 05/13/2023   BILITOT 0.7 05/13/2023      Patient Active Problem List   Diagnosis Date Noted   Constipation 11/19/2023   Arm swelling 11/19/2023   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/19/2023   Fever 11/17/2023   Pedal edema 10/24/2023   Frequent UTI 10/10/2023   DOE (dyspnea on exertion) 07/29/2023    Urinary frequency 06/09/2023   Leg weakness, bilateral 05/13/2023   Left-sided headache 05/13/2023   Wheezing 05/13/2023   Nocturnal leg cramps 02/19/2023   Hypokalemia 02/19/2023   Mixed incontinence 02/19/2023   Right shoulder pain 01/13/2023   Arm paresthesia, right 11/18/2022   Impaired exercise tolerance 11/17/2022   Dizziness 10/28/2022   Grief reaction 10/28/2022   Pre-operative clearance 12/19/2021   Current use of proton pump inhibitor 05/24/2021   Low back pain 09/13/2020   Estrogen deficiency 04/26/2020   Prediabetes 04/26/2020   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   GERD (gastroesophageal reflux disease) 07/26/2019   Essential hypertension 07/26/2019   Hyperlipidemia 07/26/2019   Allergic rhinitis 07/26/2019   Urge incontinence 07/26/2019   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   RLS (restless legs syndrome) 07/26/2019   Colon polyp 07/26/2019   Osteopenia 07/26/2019   History of Mohs micrographic surgery for skin cancer 07/26/2019   Past Medical History:  Diagnosis Date   Arthritis    Cataract    bilateral lens implants   GERD (gastroesophageal reflux disease)    History of allergy    History  of basal cell carcinoma (BCC) 2015   right temple Mohs   History of blood transfusion    History of SCC (squamous cell carcinoma) of skin 2017   right upper arm excised   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    History of urinary incontinence    History of UTI    Hyperlipidemia    Hypertension    Neuromuscular disorder (HCC)    restless legs    PONV (postoperative nausea and vomiting)    Past Surgical History:  Procedure Laterality Date   APPENDECTOMY  1963   CARPAL TUNNEL RELEASE Bilateral    dequivan release   CATARACT EXTRACTION, BILATERAL     08/2019, 09/2019   CESAREAN SECTION  1964   MICRODISCECTOMY LUMBAR  2019   L3L4   RECTOCELE REPAIR  2009   REMOVAL OF URINARY SLING      REPLACEMENT TOTAL KNEE  2010   REVERSE SHOULDER ARTHROPLASTY Left 01/31/2022   Procedure: REVERSE SHOULDER ARTHROPLASTY;  Surgeon: Ellard Gunning, MD;  Location: [facility];  Service: Orthopedics;  Laterality: Left;   REVERSE SHOULDER ARTHROPLASTY Right 11/13/2023   Procedure: ARTHROPLASTY, SHOULDER, TOTAL, REVERSE;  Surgeon: Ellard Gunning, MD;  Location: [facility];  Service: Orthopedics;  Laterality: Right;  exparel    ROTATOR CUFF REPAIR  2000   urterine suspension  1971   Social History   Tobacco Use   Smoking status: Never   Smokeless tobacco: Never  Vaping Use   Vaping status: Never Used  Substance Use Topics   Alcohol use: Never   Drug use: Never   Family History  Problem Relation Age of Onset   Early death Mother    Cancer Father    COPD Father    Lung cancer Father    Pancreatic cancer Sister    Alcohol abuse Brother    Heart disease Brother    Hypertension Brother    COPD Maternal Grandmother    Colon cancer Neg Hx    Esophageal cancer Neg Hx    Stomach cancer Neg Hx    Rectal cancer Neg Hx    Colon polyps Neg Hx    Allergies  Allergen Reactions   Ace Inhibitors Swelling    THROAT SWELLING   Requip [Ropinirole] Other (See Comments)    Vision change    Shrimp [Shellfish Allergy] Nausea And Vomiting   Aleve [Naproxen] Rash   Current Outpatient Medications on File Prior to Visit  Medication Sig Dispense Refill   amoxicillin  (AMOXIL ) 500 MG capsule Take 2,000 mg by mouth See admin instructions. Take 2000 mg by mouth 1 hour prior to dental appointments     Ascorbic Acid (VITAMIN C PO) Take 1 tablet by mouth in the morning and at bedtime.     bromocriptine  (PARLODEL ) 2.5 MG tablet Take 1 tablet (2.5 mg total) by mouth daily. 90 tablet 3   Calcium  Carb-Cholecalciferol (CALCIUM  + D3 PO) Take 1 tablet by mouth in the morning.     carvedilol  (COREG ) 12.5 MG tablet TAKE 1 TABLET TWICE A DAY WITH MEALS 180 tablet 3   cephALEXin  (KEFLEX ) 500 MG capsule Take 1 capsule (500 mg  total) by mouth 3 (three) times daily. 21 capsule 1   chlorthalidone  (HYGROTON ) 25 MG tablet TAKE ONE-HALF (1/2) TABLET DAILY 45 tablet 3   cholecalciferol (VITAMIN D3) 25 MCG (1000 UNIT) tablet Take 1,000 Units by mouth in the morning.     Coenzyme Q10 (COQ10 PO) Take 1 capsule by mouth 2 (two) times  a week.     CRANBERRY-VITAMIN C-D MANNOSE PO Take 2 capsules by mouth in the morning and at bedtime.     cyclobenzaprine  (FLEXERIL ) 10 MG tablet Take 1 tablet (10 mg total) by mouth 3 (three) times daily as needed for muscle spasms. 30 tablet 1   estradiol  (ESTRACE  VAGINAL) 0.1 MG/GM vaginal cream Pea sized amount to urethra/vulvar area twice weekly 42.5 g 1   fluticasone  (FLONASE ) 50 MCG/ACT nasal spray Place 2 sprays into both nostrils daily. (Patient taking differently: Place 2 sprays into both nostrils every evening.) 48 g 3   ibuprofen  (ADVIL ) 200 MG tablet Take 400-600 mg by mouth in the morning and at bedtime.     levocetirizine (XYZAL) 5 MG tablet Take 5 mg by mouth every evening.     Magnesium 250 MG CAPS Take 250 mg by mouth at bedtime.     Multiple Vitamin (MULTIVITAMIN WITH MINERALS) TABS tablet Take 1 tablet by mouth in the morning.     Multiple Vitamins-Minerals (PRESERVISION AREDS 2 PO) Take 2 capsules by mouth in the morning.     ondansetron  (ZOFRAN ) 4 MG tablet Take 1 tablet (4 mg total) by mouth every 8 (eight) hours as needed for nausea or vomiting. 10 tablet 0   oxyCODONE -acetaminophen  (PERCOCET) 5-325 MG tablet Take 1 tablet by mouth every 4 (four) hours as needed (max 6 q). 20 tablet 0   pantoprazole  (PROTONIX ) 40 MG tablet TAKE 1 TABLET DAILY 90 tablet 1   Polyethyl Glycol-Propyl Glycol (SYSTANE OP) Place 1 drop into both eyes in the morning and at bedtime.     polyethylene glycol powder (GLYCOLAX /MIRALAX ) 17 GM/SCOOP powder Take 8.5 g by mouth in the morning.     rosuvastatin  (CRESTOR ) 10 MG tablet TAKE 1 TABLET AT BEDTIME 90 tablet 3   senna (SENOKOT) 8.6 MG TABS tablet Take  1 tablet by mouth at bedtime.     TURMERIC PO Take 1 capsule by mouth 2 (two) times a week.     amLODipine  (NORVASC ) 5 MG tablet Take 1 tablet (5 mg total) by mouth daily. (Patient not taking: Reported on 11/19/2023) 90 tablet 3   methenamine (HIPREX) 1 g tablet Take 1 g by mouth 2 (two) times daily. (Patient not taking: Reported on 11/19/2023)     No current facility-administered medications on file prior to visit.    Review of Systems  Constitutional:  Positive for fatigue. Negative for activity change, appetite change, fever and unexpected weight change.  HENT:  Negative for congestion, ear pain, rhinorrhea, sinus pressure and sore throat.   Eyes:  Negative for pain, redness and visual disturbance.  Respiratory:  Negative for cough, shortness of breath and wheezing.   Cardiovascular:  Negative for chest pain and palpitations.  Gastrointestinal:  Negative for abdominal pain, blood in stool, constipation and diarrhea.  Endocrine: Negative for polydipsia and polyuria.  Genitourinary:  Negative for dysuria, frequency and urgency.  Musculoskeletal:  Negative for arthralgias, back pain and myalgias.       Post operative right arm swelling  Redness is improved Some pain   Skin:  Negative for pallor and rash.  Allergic/Immunologic: Negative for environmental allergies.  Neurological:  Negative for dizziness, syncope and headaches.       Dizziness improved with higher blood pressure now   Hematological:  Negative for adenopathy. Does not bruise/bleed easily.  Psychiatric/Behavioral:  Negative for decreased concentration and dysphoric mood. The patient is not nervous/anxious.        Objective:  Physical Exam Constitutional:      General: She is not in acute distress.    Appearance: Normal appearance. She is well-developed. She is obese. She is not ill-appearing or diaphoretic.  HENT:     Head: Normocephalic and atraumatic.  Eyes:     Conjunctiva/sclera: Conjunctivae normal.     Pupils:  Pupils are equal, round, and reactive to light.  Neck:     Thyroid : No thyromegaly.     Vascular: No carotid bruit or JVD.  Cardiovascular:     Rate and Rhythm: Normal rate and regular rhythm.     Heart sounds: Normal heart sounds.     No gallop.  Pulmonary:     Effort: Pulmonary effort is normal. No respiratory distress.     Breath sounds: Normal breath sounds. No wheezing or rales.  Abdominal:     General: There is no distension or abdominal bruit.     Palpations: Abdomen is soft.     Tenderness: There is no guarding.  Musculoskeletal:     Cervical back: Normal range of motion and neck supple.     Right lower leg: No edema.     Left lower leg: No edema.     Comments: RUE is swollen  Some resolving bruising  Some dependent erythema  Mildly warm Limited rom  Dressing intact from her recent surgery   Lymphadenopathy:     Cervical: No cervical adenopathy.  Skin:    General: Skin is warm and dry.     Coloration: Skin is not pale.     Findings: No rash.  Neurological:     Mental Status: She is alert.     Cranial Nerves: No cranial nerve deficit.     Coordination: Coordination normal.     Deep Tendon Reflexes: Reflexes are normal and symmetric. Reflexes normal.  Psychiatric:        Mood and Affect: Mood normal.           Assessment & Plan:   Problem List Items Addressed This Visit       Cardiovascular and Mediastinum   Essential hypertension - Primary   Taking  Carvedilol  12.5 mg bid Chlorthalidone  12.5 mg daily  Amlodipine  5 mg daily   Blood pressure controlled before surgery (shoulder) then became low in conjunction with fever /possible infection  Family held amlodipine  and now back to normal numbers and pt feels better BP: 136/62   Will continue holding this for now until recovery is complete Plan follow up 4-6 wk or earlier if needed  Monitor at home Lab today      Relevant Orders   CBC with Differential/Platelet   Basic metabolic panel with GFR      Other   S/P shoulder replacement   Pt had fever and arm redness post osteoporosis  Treated with keflex  by Dr Joelle Musca when unable to see ortho  Overall doing better Less redness Still swollen  Recommend finish antibiotic and follow up with ortho as planned       Relevant Orders   CBC with Differential/Platelet   Constipation   Likely due to post operative narcotic pain medication  Will continue miralax  and fluids (high fiber diet if possible) Update if not starting to improve in a week or if worsening        Arm swelling   S/p right reverse shoulder replacement  Reviewed available ortho notes  Redness is improved with antibiotic and no more fever  Encouraged follow up with her ortho surgeon

## 2023-11-19 NOTE — Assessment & Plan Note (Signed)
 Taking  Carvedilol  12.5 mg bid Chlorthalidone  12.5 mg daily  Amlodipine  5 mg daily   Blood pressure controlled before surgery (shoulder) then became low in conjunction with fever /possible infection  Family held amlodipine  and now back to normal numbers and pt feels better BP: 136/62   Will continue holding this for now until recovery is complete Plan follow up 4-6 wk or earlier if needed  Monitor at home Lab today

## 2023-12-31 ENCOUNTER — Ambulatory Visit (INDEPENDENT_AMBULATORY_CARE_PROVIDER_SITE_OTHER): Admitting: Family Medicine

## 2023-12-31 ENCOUNTER — Encounter: Payer: Self-pay | Admitting: Family Medicine

## 2023-12-31 VITALS — BP 135/70 | HR 61 | Temp 97.8°F | Ht 64.0 in | Wt 184.2 lb

## 2023-12-31 DIAGNOSIS — I10 Essential (primary) hypertension: Secondary | ICD-10-CM

## 2023-12-31 DIAGNOSIS — L304 Erythema intertrigo: Secondary | ICD-10-CM | POA: Insufficient documentation

## 2023-12-31 MED ORDER — NYSTATIN 100000 UNIT/GM EX POWD: 1.0000 | Freq: Three times a day (TID) | CUTANEOUS | 1 refills | Status: AC

## 2023-12-31 NOTE — Patient Instructions (Signed)
 Stay off the amlodipine   Blood pressure is stable   Continue exercise and PT   Take care of yourself   I sent in nystatin powder

## 2023-12-31 NOTE — Assessment & Plan Note (Signed)
 Intermittently under pannus when hot/sweating Ok today Using nystatin powder-running out  Wants to have on hand Refilled today Encouraged to keep area clean and dry as much as possible Dry well after exercise/bathing

## 2023-12-31 NOTE — Assessment & Plan Note (Signed)
 bp in fair control at this time  BP Readings from Last 1 Encounters:  12/31/23 135/70   No changes needed Remains stable off of amlodipine   Will continue Carvedilol  12.5 mg bid Chlorthalidone  12.5 mg daily    Most recent labs reviewed  Disc lifstyle change with low sodium diet and exercise

## 2023-12-31 NOTE — Progress Notes (Signed)
 Subjective:    Patient ID: Nicole Suarez, female    DOB: Mar 13, 1941, 83 y.o.   MRN: [id number]  HPI  Wt Readings from Last 3 Encounters:  12/31/23 184 lb 4 oz (83.6 kg)  11/19/23 192 lb 2 oz (87.1 kg)  11/17/23 192 lb (87.1 kg)   31.63 kg/m  Vitals:   12/31/23 1123 12/31/23 1145  BP: 132/84 135/70  Pulse: 61   Temp: 97.8 F (36.6 C)   SpO2: 98%     Pt presents for follow up of HTN and chronic medical problems Intertrigo under pannus - needs refill of nystatin powder   Feels good    HTN bp is stable today  No cp or palpitations or headaches or edema  No side effects to medicines  BP Readings from Last 3 Encounters:  12/31/23 135/70  11/19/23 136/62  11/17/23 118/80    Blood pressure is lower at home than here    Lab Results  Component Value Date   NA 137 11/19/2023   K 3.6 11/19/2023   CO2 31 11/19/2023   GLUCOSE 104 (H) 11/19/2023   BUN 12 11/19/2023   CREATININE 0.61 11/19/2023   CALCIUM  9.5 11/19/2023   GFR 84.32 11/19/2023   GFRNONAA >60 11/07/2023   Carvedilol  12.5 mg bid Chlorthalidone  12.5 mg daily  Amlodipine  5 mg daily -was holding for low blood pressure   Pulse Readings from Last 3 Encounters:  12/31/23 61  11/19/23 78  11/17/23 88     Lab Results  Component Value Date   WBC 7.3 11/19/2023   HGB 11.9 (L) 11/19/2023   HCT 35.1 (L) 11/19/2023   MCV 89.4 11/19/2023   PLT 317.0 11/19/2023  Post surgery    Shoulder is doing better gradually  Is walking more now / getting stamina up   On hiperex to prevent uti  Doing well !       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   Fever 11/17/2023   Pedal edema 10/24/2023   Frequent UTI 10/10/2023   DOE (dyspnea on exertion) 07/29/2023   Urinary frequency 06/09/2023   Leg weakness, bilateral 05/13/2023   Left-sided headache 05/13/2023   Wheezing  05/13/2023   Nocturnal leg cramps 02/19/2023   Hypokalemia 02/19/2023   Mixed incontinence 02/19/2023   Right shoulder pain 01/13/2023   Arm paresthesia, right 11/18/2022   Impaired exercise tolerance 11/17/2022   Dizziness 10/28/2022   Grief reaction 10/28/2022   Pre-operative clearance 12/19/2021   Current use of proton pump inhibitor 05/24/2021   Low back pain 09/13/2020   Estrogen deficiency 04/26/2020   Prediabetes 04/26/2020   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   GERD (gastroesophageal reflux disease) 07/26/2019   Essential hypertension 07/26/2019   Hyperlipidemia 07/26/2019   Allergic rhinitis 07/26/2019   Urge incontinence 07/26/2019   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   RLS (restless legs syndrome) 07/26/2019   Colon polyp 07/26/2019   Osteopenia 07/26/2019   History of Mohs micrographic surgery for skin cancer 07/26/2019   Past Medical History:  Diagnosis Date   Arthritis    Cataract    bilateral lens implants   GERD (gastroesophageal reflux disease)    History of allergy    History of basal cell carcinoma (BCC) 2015   right temple Mohs   History of blood transfusion    History of SCC (squamous cell carcinoma)  of skin 2017   right upper arm excised   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    History of urinary incontinence    History of UTI    Hyperlipidemia    Hypertension    Neuromuscular disorder (HCC)    restless legs    PONV (postoperative nausea and vomiting)    Past Surgical History:  Procedure Laterality Date   APPENDECTOMY  1963   CARPAL TUNNEL RELEASE Bilateral    dequivan release   CATARACT EXTRACTION, BILATERAL     08/2019, 09/2019   CESAREAN SECTION  1964   MICRODISCECTOMY LUMBAR  2019   L3L4   RECTOCELE REPAIR  2009   REMOVAL OF URINARY SLING     REPLACEMENT TOTAL KNEE  2010   REVERSE SHOULDER ARTHROPLASTY Left 01/31/2022   Procedure: REVERSE SHOULDER  ARTHROPLASTY;  Surgeon: Ellard Gunning, MD;  Location: [facility];  Service: Orthopedics;  Laterality: Left;   REVERSE SHOULDER ARTHROPLASTY Right 11/13/2023   Procedure: ARTHROPLASTY, SHOULDER, TOTAL, REVERSE;  Surgeon: Ellard Gunning, MD;  Location: [facility];  Service: Orthopedics;  Laterality: Right;  exparel    ROTATOR CUFF REPAIR  2000   urterine suspension  1971   Social History   Tobacco Use   Smoking status: Never   Smokeless tobacco: Never  Vaping Use   Vaping status: Never Used  Substance Use Topics   Alcohol use: Never   Drug use: Never   Family History  Problem Relation Age of Onset   Early death Mother    Cancer Father    COPD Father    Lung cancer Father    Pancreatic cancer Sister    Alcohol abuse Brother    Heart disease Brother    Hypertension Brother    COPD Maternal Grandmother    Colon cancer Neg Hx    Esophageal cancer Neg Hx    Stomach cancer Neg Hx    Rectal cancer Neg Hx    Colon polyps Neg Hx    Allergies  Allergen Reactions   Ace Inhibitors Swelling    THROAT SWELLING   Requip [Ropinirole] Other (See Comments)    Vision change    Shrimp [Shellfish Allergy] Nausea And Vomiting   Aleve [Naproxen] Rash   Current Outpatient Medications on File Prior to Visit  Medication Sig Dispense Refill   amoxicillin  (AMOXIL ) 500 MG capsule Take 2,000 mg by mouth See admin instructions. Take 2000 mg by mouth 1 hour prior to dental appointments     Ascorbic Acid (VITAMIN C PO) Take 1 tablet by mouth in the morning and at bedtime.     bromocriptine  (PARLODEL ) 2.5 MG tablet Take 1 tablet (2.5 mg total) by mouth daily. 90 tablet 3   Calcium  Carb-Cholecalciferol (CALCIUM  + D3 PO) Take 1 tablet by mouth in the morning.     carvedilol  (COREG ) 12.5 MG tablet TAKE 1 TABLET TWICE A DAY WITH MEALS 180 tablet 3   chlorthalidone  (HYGROTON ) 25 MG tablet TAKE ONE-HALF (1/2) TABLET DAILY 45 tablet 3   cholecalciferol (VITAMIN D3) 25 MCG (1000 UNIT) tablet Take 1,000 Units by mouth in  the morning.     Coenzyme Q10 (COQ10 PO) Take 1 capsule by mouth 2 (two) times a week.     CRANBERRY-VITAMIN C-D MANNOSE PO Take 2 capsules by mouth in the morning and at bedtime.     estradiol  (ESTRACE  VAGINAL) 0.1 MG/GM vaginal cream Pea sized amount to urethra/vulvar area twice weekly 42.5 g 1   fluticasone  (FLONASE ) 50  MCG/ACT nasal spray Place 2 sprays into both nostrils daily. (Patient taking differently: Place 2 sprays into both nostrils every evening.) 48 g 3   ibuprofen  (ADVIL ) 200 MG tablet Take 400-600 mg by mouth in the morning and at bedtime.     levocetirizine (XYZAL) 5 MG tablet Take 5 mg by mouth every evening.     Magnesium 250 MG CAPS Take 250 mg by mouth at bedtime.     methenamine (HIPREX) 1 g tablet Take 1 g by mouth 2 (two) times daily.     Multiple Vitamin (MULTIVITAMIN WITH MINERALS) TABS tablet Take 1 tablet by mouth in the morning.     Multiple Vitamins-Minerals (PRESERVISION AREDS 2 PO) Take 2 capsules by mouth in the morning.     pantoprazole  (PROTONIX ) 40 MG tablet TAKE 1 TABLET DAILY 90 tablet 1   Polyethyl Glycol-Propyl Glycol (SYSTANE OP) Place 1 drop into both eyes in the morning and at bedtime.     polyethylene glycol powder (GLYCOLAX /MIRALAX ) 17 GM/SCOOP powder Take 8.5 g by mouth in the morning.     rosuvastatin  (CRESTOR ) 10 MG tablet TAKE 1 TABLET AT BEDTIME 90 tablet 3   senna (SENOKOT) 8.6 MG TABS tablet Take 1 tablet by mouth at bedtime.     TURMERIC PO Take 1 capsule by mouth 2 (two) times a week.     No current facility-administered medications on file prior to visit.    Review of Systems  Constitutional:  Negative for activity change, appetite change, fatigue, fever and unexpected weight change.  HENT:  Negative for congestion, ear pain, rhinorrhea, sinus pressure and sore throat.   Eyes:  Negative for pain, redness and visual disturbance.  Respiratory:  Negative for cough, shortness of breath and wheezing.   Cardiovascular:  Negative for chest  pain and palpitations.  Gastrointestinal:  Negative for abdominal pain, blood in stool, constipation and diarrhea.  Endocrine: Negative for polydipsia and polyuria.  Genitourinary:  Negative for dysuria, frequency and urgency.  Musculoskeletal:  Negative for arthralgias, back pain and myalgias.  Skin:  Positive for rash. Negative for pallor.  Allergic/Immunologic: Negative for environmental allergies.  Neurological:  Negative for dizziness, syncope and headaches.  Hematological:  Negative for adenopathy. Does not bruise/bleed easily.  Psychiatric/Behavioral:  Negative for decreased concentration and dysphoric mood. The patient is not nervous/anxious.        Objective:   Physical Exam Constitutional:      General: She is not in acute distress.    Appearance: Normal appearance. She is well-developed. She is obese. She is not ill-appearing or diaphoretic.  HENT:     Head: Normocephalic and atraumatic.   Eyes:     Conjunctiva/sclera: Conjunctivae normal.     Pupils: Pupils are equal, round, and reactive to light.   Neck:     Thyroid : No thyromegaly.     Vascular: No carotid bruit or JVD.   Cardiovascular:     Rate and Rhythm: Normal rate and regular rhythm.     Heart sounds: Normal heart sounds.     No gallop.  Pulmonary:     Effort: Pulmonary effort is normal. No respiratory distress.     Breath sounds: Normal breath sounds. No wheezing or rales.  Abdominal:     General: There is no distension or abdominal bruit.     Palpations: Abdomen is soft.   Musculoskeletal:     Cervical back: Normal range of motion and neck supple.     Right lower leg: No edema.  Left lower leg: No edema.  Lymphadenopathy:     Cervical: No cervical adenopathy.   Skin:    General: Skin is warm and dry.     Coloration: Skin is not pale.     Findings: No rash.     Comments: No rash today   Neurological:     Mental Status: She is alert.     Coordination: Coordination normal.     Deep Tendon  Reflexes: Reflexes are normal and symmetric. Reflexes normal.   Psychiatric:        Mood and Affect: Mood normal.           Assessment & Plan:   Problem List Items Addressed This Visit       Cardiovascular and Mediastinum   Essential hypertension - Primary   bp in fair control at this time  BP Readings from Last 1 Encounters:  12/31/23 135/70   No changes needed Remains stable off of amlodipine   Will continue Carvedilol  12.5 mg bid Chlorthalidone  12.5 mg daily    Most recent labs reviewed  Disc lifstyle change with low sodium diet and exercise          Musculoskeletal and Integument   Intertrigo   Intermittently under pannus when hot/sweating Ok today Using nystatin powder-running out  Wants to have on hand Refilled today Encouraged to keep area clean and dry as much as possible Dry well after exercise/bathing

## 2024-02-05 ENCOUNTER — Ambulatory Visit

## 2024-02-05 VITALS — BP 136/64 | Ht 64.5 in | Wt 186.0 lb

## 2024-02-05 DIAGNOSIS — Z0000 Encounter for general adult medical examination without abnormal findings: Secondary | ICD-10-CM | POA: Diagnosis not present

## 2024-02-05 NOTE — Patient Instructions (Addendum)
 Ms. Delon , Thank you for taking time out of your busy schedule to complete your Annual Wellness Visit with me. I enjoyed our conversation and look forward to speaking with you again next year. I, as well as your care team,  appreciate your ongoing commitment to your health goals. Please review the following plan we discussed and let me know if I can assist you in the future. Your Game plan/ To Do List    Referrals: If you haven't heard from the office you've been referred to, please reach out to them at the phone provided.  none Follow up Visits: Next Medicare AWV with our clinical staff: 02/08/2025   Have you seen your provider in the last 6 months (3 months if uncontrolled diabetes)? Yes Next Office Visit with your provider: 07/01/2024  Clinician Recommendations:  Aim for 30 minutes of exercise or brisk walking, 6-8 glasses of water , and 5 servings of fruits and vegetables each day.       This is a list of the screening recommended for you and due dates:  Health Maintenance  Topic Date Due   DTaP/Tdap/Td vaccine (2 - Tdap) 05/12/2024*   COVID-19 Vaccine (7 - 2024-25 season) 05/12/2025*   Flu Shot  02/13/2024   Mammogram  09/10/2024   Medicare Annual Wellness Visit  02/04/2025   Pneumococcal Vaccine for age over 7  Completed   DEXA scan (bone density measurement)  Completed   Zoster (Shingles) Vaccine  Completed   Hepatitis B Vaccine  Aged Out   HPV Vaccine  Aged Out   Meningitis B Vaccine  Aged Out  *Topic was postponed. The date shown is not the original due date.    Advanced directives:Patient has Advanced care directives in the chart.  Advance Care Planning is important because it:  [x]  Makes sure you receive the medical care that is consistent with your values, goals, and preferences  [x]  It provides guidance to your family and loved ones and reduces their decisional burden about whether or not they are making the right decisions based on your wishes.  Follow the link  provided in your after visit summary or read over the paperwork we have mailed to you to help you started getting your Advance Directives in place. If you need assistance in completing these, please reach out to us  so that we can help you!  See attachments for Preventive Care and Fall Prevention Tips.

## 2024-02-05 NOTE — Progress Notes (Signed)
 Because this visit was a virtual/telehealth visit,  certain criteria was not obtained, such a blood pressure, CBG if applicable, and timed get up and go. Any medications not marked as taking were not mentioned during the medication reconciliation part of the visit. Any vitals not documented were not able to be obtained due to this being a telehealth visit or patient was unable to self-report a recent blood pressure reading due to a lack of equipment at home via telehealth. Vitals that have been documented are verbally provided by the patient.  This visit was performed by a medical professional under my direct supervision. I was immediately available for consultation/collaboration. I have reviewed and agree with the Annual Wellness Visit documentation.  Subjective:   Nicole Suarez is a 83 y.o. who presents for a Medicare Wellness preventive visit.  As a reminder, Annual Wellness Visits don't include a physical exam, and some assessments may be limited, especially if this visit is performed virtually. We may recommend an in-person follow-up visit with your provider if needed.  Visit Complete: Virtual I connected with  Nicole Suarez on 02/05/24 by a audio enabled telemedicine application and verified that I am speaking with the correct person using two identifiers.  Patient Location: Home  Provider Location: Home Office  I discussed the limitations of evaluation and management by telemedicine. The patient expressed understanding and agreed to proceed.  Vital Signs: Because this visit was a virtual/telehealth visit, some criteria may be missing or patient reported. Any vitals not documented were not able to be obtained and vitals that have been documented are patient reported.  VideoDeclined- This patient declined Librarian, academic. Therefore the visit was completed with audio only.  Persons Participating in Visit: Patient.  AWV Questionnaire: Yes: Patient Medicare  AWV questionnaire was completed by the patient on 01/29/2024; I have confirmed that all information answered by patient is correct and no changes since this date.  Cardiac Risk Factors include: advanced age (>72men, >78 women);hypertension;obesity (BMI >30kg/m2);dyslipidemia     Objective:    Today's Vitals   01/29/24 0925 02/05/24 0945  BP:  136/64  Weight:  186 lb (84.4 kg)  Height:  5' 4.5 (1.638 m)  PainSc: 6     Body mass index is 31.43 kg/m.     02/05/2024    9:45 AM 11/07/2023    9:54 AM 09/15/2023   10:51 AM 01/31/2022    5:53 AM 01/22/2022   10:47 AM 05/30/2021    9:49 AM 05/26/2020    9:51 AM  Advanced Directives  Does Patient Have a Medical Advance Directive? Yes Yes Yes Yes Yes Yes Yes  Type of Estate agent of [place];Living will Healthcare Power of [employer] of [place];Living will Healthcare Power of [place];Living will Healthcare Power of [place];Living will Healthcare Power of [place];Living will Healthcare Power of [place];Living will  Does patient want to make changes to medical advance directive? No - Patient declined   No - Patient declined  Yes (ED - Information included in AVS)   Copy of Healthcare Power of Attorney in Chart? Yes - validated most recent copy scanned in chart (See row information) No - copy requested  Yes - validated most recent copy scanned in chart (See row information)   No - copy requested    Current Medications (verified) Outpatient Encounter Medications as of 02/05/2024  Medication Sig   amoxicillin  (AMOXIL ) 500 MG capsule Take 2,000 mg by mouth See admin instructions. Take 2000 mg by mouth 1  hour prior to dental appointments   Ascorbic Acid (VITAMIN C PO) Take 1 tablet by mouth in the morning and at bedtime.   bromocriptine  (PARLODEL ) 2.5 MG tablet Take 1 tablet (2.5 mg total) by mouth daily.   Calcium  Carb-Cholecalciferol (CALCIUM  + D3 PO) Take 1 tablet by mouth in the morning.   carvedilol   (COREG ) 12.5 MG tablet TAKE 1 TABLET TWICE A DAY WITH MEALS   chlorthalidone  (HYGROTON ) 25 MG tablet TAKE ONE-HALF (1/2) TABLET DAILY   cholecalciferol (VITAMIN D3) 25 MCG (1000 UNIT) tablet Take 1,000 Units by mouth in the morning.   Coenzyme Q10 (COQ10 PO) Take 1 capsule by mouth 2 (two) times a week.   CRANBERRY-VITAMIN C-D MANNOSE PO Take 2 capsules by mouth in the morning and at bedtime.   estradiol  (ESTRACE  VAGINAL) 0.1 MG/GM vaginal cream Pea sized amount to urethra/vulvar area twice weekly   fluticasone  (FLONASE ) 50 MCG/ACT nasal spray Place 2 sprays into both nostrils daily. (Patient taking differently: Place 2 sprays into both nostrils every evening.)   ibuprofen  (ADVIL ) 200 MG tablet Take 400-600 mg by mouth in the morning and at bedtime.   levocetirizine (XYZAL) 5 MG tablet Take 5 mg by mouth every evening.   Magnesium 250 MG CAPS Take 250 mg by mouth at bedtime.   methenamine (HIPREX) 1 g tablet Take 1 g by mouth 2 (two) times daily.   Multiple Vitamin (MULTIVITAMIN WITH MINERALS) TABS tablet Take 1 tablet by mouth in the morning.   Multiple Vitamins-Minerals (PRESERVISION AREDS 2 PO) Take 2 capsules by mouth in the morning.   nystatin  (MYCOSTATIN /NYSTOP ) powder Apply 1 Application topically 3 (three) times daily. To affected area   pantoprazole  (PROTONIX ) 40 MG tablet TAKE 1 TABLET DAILY   Polyethyl Glycol-Propyl Glycol (SYSTANE OP) Place 1 drop into both eyes in the morning and at bedtime.   polyethylene glycol powder (GLYCOLAX /MIRALAX ) 17 GM/SCOOP powder Take 8.5 g by mouth in the morning.   rosuvastatin  (CRESTOR ) 10 MG tablet TAKE 1 TABLET AT BEDTIME   senna (SENOKOT) 8.6 MG TABS tablet Take 1 tablet by mouth at bedtime.   TURMERIC PO Take 1 capsule by mouth 2 (two) times a week.   No facility-administered encounter medications on file as of 02/05/2024.    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    History of urinary incontinence    History of UTI    Hyperlipidemia    Hypertension    Neuromuscular disorder (HCC)    restless legs    PONV (postoperative nausea and vomiting)    Past Surgical History:  Procedure Laterality Date   APPENDECTOMY  1963   CARPAL TUNNEL RELEASE Bilateral    dequivan release   CATARACT EXTRACTION, BILATERAL     08/2019, 09/2019   CESAREAN SECTION  1964   MICRODISCECTOMY LUMBAR  2019   L3L4   RECTOCELE REPAIR  2009   REMOVAL OF URINARY SLING     REPLACEMENT TOTAL KNEE  2010  REVERSE SHOULDER ARTHROPLASTY Left 01/31/2022   Procedure: REVERSE SHOULDER ARTHROPLASTY;  Surgeon: Melita Drivers, MD;  Location: [facility];  Service: Orthopedics;  Laterality: Left;   REVERSE SHOULDER ARTHROPLASTY Suarez 11/13/2023   Procedure: ARTHROPLASTY, SHOULDER, TOTAL, REVERSE;  Surgeon: Melita Drivers, MD;  Location: [facility];  Service: Orthopedics;  Laterality: Suarez;  exparel    ROTATOR CUFF REPAIR  2000   urterine suspension  1971   Family History  Problem Relation Age of Onset   Early death Mother    Cancer Father    COPD Father    Lung cancer Father    Pancreatic cancer Sister    Alcohol abuse Brother    Heart disease Brother    Hypertension Brother    COPD Maternal Grandmother    Colon cancer Neg Hx    Esophageal cancer Neg Hx    Stomach cancer Neg Hx    Rectal cancer Neg Hx    Colon polyps Neg Hx    Social History   Socioeconomic History   Marital status: Widowed    Spouse name: Not on file   Number of children: Not on file   Years  of education: Not on file   Highest education level: Associate degree: occupational, Scientist, product/process development, or vocational program  Occupational History   Occupation: retired Charity fundraiser  Tobacco Use   Smoking status: Never   Smokeless tobacco: Never  Vaping Use   Vaping status: Never Used  Substance and Sexual Activity   Alcohol use: Never   Drug use: Never   Sexual activity: Not on file  Other Topics Concern   Not on file  Social History Narrative   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    Moved her from [place] in 2020   Cares for disabled husband   Lives in [place]    Social Drivers of Health   Financial Resource Strain: Low Risk  (02/05/2024)   Overall Financial Resource Strain (CARDIA)    Difficulty of Paying Living Expenses: Not hard at all  Food Insecurity: No Food Insecurity (02/05/2024)   Hunger Vital Sign    Worried About Running Out of Food in the Last Year: Never true    Ran Out of Food in the Last Year: Never true  Transportation Needs: No Transportation Needs (02/05/2024)   PRAPARE - Administrator, Civil Service (Medical): No    Lack of Transportation (Non-Medical): No  Physical Activity: Insufficiently Active (02/05/2024)   Exercise Vital Sign    Days of Exercise per Week: 4 days    Minutes of Exercise per Session: 30 min  Stress: No Stress Concern Present (02/05/2024)   [employer] of Occupational Health - Occupational Stress Questionnaire    Feeling of Stress: Only a little  Social Connections: Moderately Isolated (02/05/2024)   Social Connection and Isolation Panel    Frequency of Communication with Friends and Family: More than three times a week    Frequency of Social Gatherings with Friends and Family: Twice a week    Attends Religious Services: More than 4 times per year    Active Member of [employer] or Organizations: No    Attends Banker Meetings: Never    Marital Status: Widowed    Tobacco Counseling Counseling given: Not  Answered    Clinical Intake:  Pre-visit preparation completed: Yes  Pain : 0-10 Pain Score: 6  Pain Type: Chronic pain Pain Location: Foot Pain Orientation: Suarez Pain Descriptors / Indicators: Aching Pain Onset: Today Pain Frequency:  Several days a week Pain Relieving Factors: put support in her shoes  Pain Relieving Factors: put support in her shoes  BMI - recorded: 31.43 Nutritional Status: BMI > 30  Obese Nutritional Risks: None Diabetes: No  Lab Results  Component Value Date   HGBA1C 5.9 (A) 07/29/2023   HGBA1C 6.1 11/18/2022   HGBA1C 5.7 12/19/2021     How often do you need to have someone help you when you read instructions, pamphlets, or other written materials from your doctor or pharmacy?: 1 - Never  Interpreter Needed?: No  Information entered by :: Nicole Anderson LATHER   Activities of Daily Living     01/29/2024    9:25 AM 11/07/2023    9:58 AM  In your present state of health, do you have any difficulty performing the following activities:  Hearing? 0   Vision? 0   Difficulty concentrating or making decisions? 0   Walking or climbing stairs? 1   Dressing or bathing? 0   Doing errands, shopping? 0 0  Preparing Food and eating ? N   Using the Toilet? N   In the past six months, have you accidently leaked urine? Y   Do you have problems with loss of bowel control? Y   Managing your Medications? N   Managing your Finances? N   Housekeeping or managing your Housekeeping? N     Patient Care Team: [place], Laine LABOR, MD as PCP - General (Family Medicine)  I have updated your Care Teams any recent Medical Services you may have received from other providers in the past year.     Assessment:   This is a routine wellness examination for Nicole Suarez.  Hearing/Vision screen Hearing Screening - Comments:: No difficulties Vision Screening - Comments:: Patient wears readers   Goals Addressed             This Visit's Progress    Patient Stated   On  track    Would like to maintain current weight and drinking more water .       Depression Screen     02/05/2024    9:51 AM 11/19/2023   12:31 PM 11/17/2023    2:04 PM 10/24/2023   10:14 AM 01/13/2023    9:56 AM 12/19/2021   11:22 AM 05/30/2021    9:53 AM  PHQ 2/9 Scores  PHQ - 2 Score 0 0 0 0 0 0 0  PHQ- 9 Score 0 2  1 1       Fall Risk     01/29/2024    9:25 AM 11/19/2023   12:31 PM 11/17/2023    2:04 PM 10/24/2023   10:14 AM 01/13/2023    9:56 AM  Fall Risk   Falls in the past year? 0 0 0 0 1  Number falls in past yr: 0 0 0 0 1  Injury with Fall? 0 0 0 0 1  Risk for fall due to : No Fall Risks No Fall Risks No Fall Risks No Fall Risks History of fall(s)  Follow up Falls evaluation completed Falls evaluation completed Falls evaluation completed Falls evaluation completed Falls evaluation completed    MEDICARE RISK AT HOME:  Medicare Risk at Home Any stairs in or around the home?: (Patient-Rptd) Yes If so, are there any without handrails?: (Patient-Rptd) No Home free of loose throw rugs in walkways, pet beds, electrical cords, etc?: (Patient-Rptd) No Adequate lighting in your home to reduce risk of falls?: (Patient-Rptd) Yes Life alert?: (Patient-Rptd) Yes Use of a  cane, walker or w/c?: (Patient-Rptd) No Grab bars in the bathroom?: (Patient-Rptd) Yes Shower chair or bench in shower?: (Patient-Rptd) Yes Elevated toilet seat or a handicapped toilet?: (Patient-Rptd) Yes  TIMED UP AND GO:  Was the test performed?  No  Cognitive Function: 6CIT completed    05/26/2020    9:54 AM  MMSE - Mini Mental State Exam  Orientation to time 5  Orientation to Place 5  Registration 3  Attention/ Calculation 5  Recall 3  Language- repeat 1        02/05/2024    9:52 AM  6CIT Screen  What Year? 0 points  What month? 0 points  What time? 0 points  Count back from 20 0 points  Months in reverse 0 points  Repeat phrase 0 points  Total Score 0 points    Immunizations Immunization  History  Administered Date(s) Administered    sv, Bivalent, Protein Subunit Rsvpref,pf (Abrysvo) 08/27/2023   Fluad Quad(high Dose 65+) 03/27/2022   Influenza, High Dose Seasonal PF 03/28/2020, 03/28/2021, 03/21/2023   Influenza-Unspecified 04/04/2019   PFIZER Comirnaty(Gray Top)Covid-19 Tri-Sucrose Vaccine 10/22/2020   PFIZER(Purple Top)SARS-COV-2 Vaccination 09/03/2019, 09/24/2019, 04/28/2020   Pfizer Covid-19 Vaccine Bivalent Booster 34yrs & up 03/28/2021   Pfizer(Comirnaty)Fall Seasonal Vaccine 12 years and older 04/12/2022   Pneumococcal Conjugate-13 11/30/2014   Pneumococcal Polysaccharide-23 03/28/2020   Td 01/12/2013   Zoster Recombinant(Shingrix) 11/11/2016, 03/26/2017    Screening Tests Health Maintenance  Topic Date Due   DTaP/Tdap/Td (2 - Tdap) 05/12/2024 (Originally 01/13/2023)   COVID-19 Vaccine (7 - 2024-25 season) 05/12/2025 (Originally 03/16/2023)   INFLUENZA VACCINE  02/13/2024   MAMMOGRAM  09/10/2024   Medicare Annual Wellness (AWV)  02/04/2025   Pneumococcal Vaccine: 50+ Years  Completed   DEXA SCAN  Completed   Zoster Vaccines- Shingrix  Completed   Hepatitis B Vaccines  Aged Out   HPV VACCINES  Aged Out   Meningococcal B Vaccine  Aged Out    Health Maintenance  There are no preventive care reminders to display for this patient. Health Maintenance Items Addressed:nothing needed   Additional Screening:  Vision Screening: Recommended annual ophthalmology exams for early detection of glaucoma and other disorders of the eye. Would you like a referral to an eye doctor? No    Dental Screening: Recommended annual dental exams for proper oral hygiene  Community Resource Referral / Chronic Care Management: CRR required this visit?  No   CCM required this visit?  No   Plan:    I have personally reviewed and noted the following in the patient's chart:   Medical and social history Use of alcohol, tobacco or illicit drugs  Current medications and  supplements including opioid prescriptions. Patient is not currently taking opioid prescriptions. Functional ability and status Nutritional status Physical activity Advanced directives List of other physicians Hospitalizations, surgeries, and ER visits in previous 12 months Vitals Screenings to include cognitive, depression, and falls Referrals and appointments  In addition, I have reviewed and discussed with patient certain preventive protocols, quality metrics, and best practice recommendations. A written personalized care plan for preventive services as well as general preventive health recommendations were provided to patient.   Nicole Suarez, [place]   02/05/2024   After Visit Summary: (MyChart) Due to this being a telephonic visit, the after visit summary with patients personalized plan was offered to patient via MyChart   Notes: Nothing significant to report at this time.

## 2024-03-09 ENCOUNTER — Ambulatory Visit: Admitting: Student

## 2024-03-11 ENCOUNTER — Encounter: Payer: Self-pay | Admitting: Family Medicine

## 2024-04-05 ENCOUNTER — Ambulatory Visit: Admitting: Cardiovascular Disease

## 2024-04-19 ENCOUNTER — Other Ambulatory Visit: Payer: Self-pay | Admitting: Family Medicine

## 2024-07-01 ENCOUNTER — Encounter: Admitting: Family Medicine

## 2024-07-06 ENCOUNTER — Ambulatory Visit: Admitting: Family Medicine

## 2024-07-06 ENCOUNTER — Encounter: Payer: Self-pay | Admitting: Family Medicine

## 2024-07-06 VITALS — BP 134/72 | HR 61 | Temp 97.8°F | Ht 62.25 in | Wt 177.4 lb

## 2024-07-06 DIAGNOSIS — Z79899 Other long term (current) drug therapy: Secondary | ICD-10-CM

## 2024-07-06 DIAGNOSIS — E7800 Pure hypercholesterolemia, unspecified: Secondary | ICD-10-CM | POA: Diagnosis not present

## 2024-07-06 DIAGNOSIS — R7989 Other specified abnormal findings of blood chemistry: Secondary | ICD-10-CM | POA: Diagnosis not present

## 2024-07-06 DIAGNOSIS — Z0000 Encounter for general adult medical examination without abnormal findings: Secondary | ICD-10-CM

## 2024-07-06 DIAGNOSIS — I10 Essential (primary) hypertension: Secondary | ICD-10-CM | POA: Diagnosis not present

## 2024-07-06 DIAGNOSIS — M8580 Other specified disorders of bone density and structure, unspecified site: Secondary | ICD-10-CM | POA: Diagnosis not present

## 2024-07-06 DIAGNOSIS — K219 Gastro-esophageal reflux disease without esophagitis: Secondary | ICD-10-CM | POA: Diagnosis not present

## 2024-07-06 DIAGNOSIS — R7303 Prediabetes: Secondary | ICD-10-CM | POA: Diagnosis not present

## 2024-07-06 DIAGNOSIS — R29898 Other symptoms and signs involving the musculoskeletal system: Secondary | ICD-10-CM | POA: Diagnosis not present

## 2024-07-06 LAB — LIPID PANEL
Cholesterol: 146 mg/dL (ref 28–200)
HDL: 58.6 mg/dL
LDL Cholesterol: 66 mg/dL (ref 10–99)
NonHDL: 87.07
Total CHOL/HDL Ratio: 2
Triglycerides: 107 mg/dL (ref 10.0–149.0)
VLDL: 21.4 mg/dL (ref 0.0–40.0)

## 2024-07-06 LAB — CBC WITH DIFFERENTIAL/PLATELET
Basophils Absolute: 0 K/uL (ref 0.0–0.1)
Basophils Relative: 0.3 % (ref 0.0–3.0)
Eosinophils Absolute: 0.1 K/uL (ref 0.0–0.7)
Eosinophils Relative: 1.6 % (ref 0.0–5.0)
HCT: 41.9 % (ref 36.0–46.0)
Hemoglobin: 14.4 g/dL (ref 12.0–15.0)
Lymphocytes Relative: 21.9 % (ref 12.0–46.0)
Lymphs Abs: 1.4 K/uL (ref 0.7–4.0)
MCHC: 34.4 g/dL (ref 30.0–36.0)
MCV: 87.3 fl (ref 78.0–100.0)
Monocytes Absolute: 0.5 K/uL (ref 0.1–1.0)
Monocytes Relative: 7.9 % (ref 3.0–12.0)
Neutro Abs: 4.2 K/uL (ref 1.4–7.7)
Neutrophils Relative %: 68.3 % (ref 43.0–77.0)
Platelets: 252 K/uL (ref 150.0–400.0)
RBC: 4.8 Mil/uL (ref 3.87–5.11)
RDW: 13.8 % (ref 11.5–15.5)
WBC: 6.2 K/uL (ref 4.0–10.5)

## 2024-07-06 LAB — COMPREHENSIVE METABOLIC PANEL WITH GFR
ALT: 20 U/L (ref 3–35)
AST: 24 U/L (ref 5–37)
Albumin: 4.4 g/dL (ref 3.5–5.2)
Alkaline Phosphatase: 78 U/L (ref 39–117)
BUN: 18 mg/dL (ref 6–23)
CO2: 28 meq/L (ref 19–32)
Calcium: 9.2 mg/dL (ref 8.4–10.5)
Chloride: 100 meq/L (ref 96–112)
Creatinine, Ser: 0.68 mg/dL (ref 0.40–1.20)
GFR: 81.78 mL/min
Glucose, Bld: 100 mg/dL — ABNORMAL HIGH (ref 70–99)
Potassium: 3.4 meq/L — ABNORMAL LOW (ref 3.5–5.1)
Sodium: 138 meq/L (ref 135–145)
Total Bilirubin: 0.6 mg/dL (ref 0.2–1.2)
Total Protein: 6.7 g/dL (ref 6.0–8.3)

## 2024-07-06 LAB — VITAMIN B12: Vitamin B-12: 278 pg/mL (ref 211–911)

## 2024-07-06 LAB — HEMOGLOBIN A1C: Hgb A1c MFr Bld: 5.9 % (ref 4.6–6.5)

## 2024-07-06 LAB — TSH: TSH: 1.28 u[IU]/mL (ref 0.35–5.50)

## 2024-07-06 NOTE — Assessment & Plan Note (Signed)
 Now taking voquenza and doing much better  B12 level today

## 2024-07-06 NOTE — Assessment & Plan Note (Signed)
 Walking more slowly but still active Given handicapped parking form

## 2024-07-06 NOTE — Assessment & Plan Note (Signed)
 Reviewed health habits including diet and exercise and skin cancer prevention Reviewed appropriate screening tests for age  Also reviewed health mt list, fam hx and immunization status , as well as social and family history   See HPI Labs reviewed and ordered Health Maintenance  Topic Date Due   DTaP/Tdap/Td vaccine (2 - Tdap) 01/13/2023   COVID-19 Vaccine (7 - 2025-26 season) 05/12/2025*   Breast Cancer Screening  09/10/2024   Medicare Annual Wellness Visit  02/04/2025   Pneumococcal Vaccine for age over 4  Completed   Flu Shot  Completed   Osteoporosis screening with Bone Density Scan  Completed   Zoster (Shingles) Vaccine  Completed   Meningitis B Vaccine  Aged Out  *Topic was postponed. The date shown is not the original due date.    Discussed fall prevention, supplements and exercise for bone density  Has dexa and mammo planned at gyn office  Plan made for exercise  PHQ 0 Some exhaustion from care giving- will follow up when back to usual routine if want to discuss cognition

## 2024-07-06 NOTE — Progress Notes (Signed)
 "  Subjective:    Patient ID: Nicole Suarez, female    DOB: March 16, 1941, 83 y.o.   MRN: [id number]  HPI  Here for health maintenance exam and to review chronic medical problems   Wt Readings from Last 3 Encounters:  07/06/24 177 lb 6 oz (80.5 kg)  02/05/24 186 lb (84.4 kg)  12/31/23 184 lb 4 oz (83.6 kg)   32.18 kg/m  Vitals:   07/06/24 1017 07/06/24 1047  BP: (!) 142/74 134/72  Pulse: 61   Temp: 97.8 F (36.6 C)   SpO2: 99%     Immunization History  Administered Date(s) Administered    sv, Bivalent, Protein Subunit Rsvpref,pf (Abrysvo) 08/27/2023   Fluad Quad(high Dose 65+) 03/27/2022   Fluad Trivalent(High Dose 65+) 05/06/2024   INFLUENZA, HIGH DOSE SEASONAL PF 03/28/2020, 03/28/2021, 03/21/2023   Influenza-Unspecified 04/04/2019   PFIZER Comirnaty(Gray Top)Covid-19 Tri-Sucrose Vaccine 10/22/2020   PFIZER(Purple Top)SARS-COV-2 Vaccination 09/03/2019, 09/24/2019, 04/28/2020   Pfizer Covid-19 Vaccine Bivalent Booster 65yrs & up 03/28/2021   Pfizer(Comirnaty)Fall Seasonal Vaccine 12 years and older 04/12/2022   Pneumococcal Conjugate-13 11/30/2014   Pneumococcal Polysaccharide-23 03/28/2020   Td 01/12/2013   Zoster Recombinant(Shingrix) 11/11/2016, 03/26/2017    Health Maintenance Due  Topic Date Due   DTaP/Tdap/Td (2 - Tdap) 01/13/2023   Was in [place] for 5 mo helping her sister  Clemens and had a head injury / concussion (no bleeding)   Is exhausted and was overwhelmed  Home for 3 months  Needs to get back on track with self care   Mammogram 08/2023  Self breast exam- no lumps /declines exam   Gyn health Sees gyn provider - has appointment upcoming and has mammogram there    Colon cancer screening -out aged Colonoscopy 2022  Bone health  Dexa 01/2021 osteopenia - will get that at gyn also  Falls-one  Fractures-none  Supplements  Last vitamin D  Lab Results  Component Value Date   VD25OH 38.35 11/18/2022    Exercise  Getting settled into routine  [facility]      Mood    02/05/2024    9:51 AM 11/19/2023   12:31 PM 11/17/2023    2:04 PM 10/24/2023   10:14 AM 01/13/2023    9:56 AM  Depression screen PHQ 2/9  Decreased Interest 0 0 0 0 0  Down, Depressed, Hopeless 0 0 0 0 0  PHQ - 2 Score 0 0 0 0 0  Altered sleeping 0 1  0 1  Tired, decreased energy 0 1  1 0  Change in appetite 0 0  0 0  Feeling bad or failure about yourself  0 0  0 0  Trouble concentrating 0 0  0 0  Moving slowly or fidgety/restless 0 0  0 0  Suicidal thoughts 0 0  0 0  PHQ-9 Score 0  2   1  1    Difficult doing work/chores Not difficult at all Somewhat difficult  Not difficult at all Not difficult at all     Data saved with a previous flowsheet row definition   Doing well despite stress  Wants to re visit memory when less overwhelmed /post concussion    HTN bp is stable today  No cp or palpitations or headaches or edema  No side effects to medicines  BP Readings from Last 3 Encounters:  07/06/24 134/72  02/05/24 136/64  12/31/23 135/70     Lab Results  Component Value Date   NA 137  11/19/2023   K 3.6 11/19/2023   CO2 31 11/19/2023   GLUCOSE 104 (H) 11/19/2023   BUN 12 11/19/2023   CREATININE 0.61 11/19/2023   CALCIUM  9.5 11/19/2023   GFR 84.32 11/19/2023   GFRNONAA >60 11/07/2023   Carvedilol  12.5 mg bid Chlorhtalidone 12.5 mg daily  Prediabetes Lab Results  Component Value Date   HGBA1C 5.9 (A) 07/29/2023   HGBA1C 6.1 11/18/2022   HGBA1C 5.7 12/19/2021    Hyperlipidemia Lab Results  Component Value Date   CHOL 147 11/18/2022   HDL 53.80 11/18/2022   LDLCALC 67 11/18/2022   TRIG 132.0 11/18/2022   CHOLHDL 3 11/18/2022   Due for labs  Crestor  10 mg daily   History of prolactin secreting pituitary adenoma  Takes bronmocriptine   Needs handicapped parking form Walking is slow /limited   Voquaza for indigestion   Patient Active Problem List   Diagnosis Date Noted   Intertrigo 12/31/2023   S/P shoulder  replacement 11/19/2023   Pedal edema 10/24/2023   Frequent UTI 10/10/2023   Urinary frequency 06/09/2023   Leg weakness, bilateral 05/13/2023   Left-sided headache 05/13/2023   Nocturnal leg cramps 02/19/2023   Hypokalemia 02/19/2023   Mixed incontinence 02/19/2023   Right shoulder pain 01/13/2023   Arm paresthesia, right 11/18/2022   Impaired exercise tolerance 11/17/2022   Pre-operative clearance 12/19/2021   Current use of proton pump inhibitor 05/24/2021   Low back pain 09/13/2020   Routine general medical examination at a health care facility 04/26/2020   Estrogen deficiency 04/26/2020   Prediabetes 04/26/2020   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   GERD (gastroesophageal reflux disease) 07/26/2019   Essential hypertension 07/26/2019   Hyperlipidemia 07/26/2019   Allergic rhinitis 07/26/2019   Urge incontinence 07/26/2019   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   RLS (restless legs syndrome) 07/26/2019   Colon polyp 07/26/2019   Osteopenia 07/26/2019   History of Mohs micrographic surgery for skin cancer 07/26/2019   Past Medical History:  Diagnosis Date   Allergy    Years   Arthritis    Years   Cataract    bilateral lens implants   GERD (gastroesophageal reflux disease)    Years   History of allergy    History of basal cell carcinoma (BCC) 2015   right temple Mohs   History of blood transfusion    History of SCC (squamous cell carcinoma) of skin 2017   right upper arm excised   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   Hypertension ?   Neuromuscular disorder (HCC)    restless legs    PONV (postoperative nausea and vomiting)    Past Surgical History:  Procedure Laterality Date   APPENDECTOMY  1963   CARPAL TUNNEL RELEASE Bilateral    dequivan release   CATARACT EXTRACTION, BILATERAL     08/2019, 09/2019   CESAREAN  SECTION  1964   EYE SURGERY  2022   Cataract   JOINT REPLACEMENT  2025   Rt Shoulder replacement   MICRODISCECTOMY LUMBAR  2019   L3L4   RECTOCELE REPAIR  2009   REMOVAL OF URINARY SLING     REPLACEMENT TOTAL KNEE  2010   REVERSE SHOULDER ARTHROPLASTY Left 01/31/2022   Procedure: REVERSE SHOULDER ARTHROPLASTY;  Surgeon: Melita Drivers, MD;  Location: [facility];  Service: Orthopedics;  Laterality: Left;   REVERSE SHOULDER ARTHROPLASTY  Right 11/13/2023   Procedure: ARTHROPLASTY, SHOULDER, TOTAL, REVERSE;  Surgeon: Melita Drivers, MD;  Location: [facility];  Service: Orthopedics;  Laterality: Right;  exparel    ROTATOR CUFF REPAIR  2000   SPINE SURGERY  2019   TUBAL LIGATION  1974   urterine suspension  1971   Social History[1] Family History  Problem Relation Age of Onset   Early death Mother    Cancer Father    COPD Father    Lung cancer Father    Pancreatic cancer Sister    Alcohol abuse Brother    Heart disease Brother    Hypertension Brother    COPD Maternal Grandmother    Colon cancer Neg Hx    Esophageal cancer Neg Hx    Stomach cancer Neg Hx    Rectal cancer Neg Hx    Colon polyps Neg Hx    Allergies[2] Medications Ordered Prior to Encounter[3]  Review of Systems  Constitutional:  Positive for fatigue. Negative for activity change, appetite change, fever and unexpected weight change.  HENT:  Negative for congestion, ear pain, rhinorrhea, sinus pressure and sore throat.   Eyes:  Negative for pain, redness and visual disturbance.  Respiratory:  Negative for cough, shortness of breath and wheezing.   Cardiovascular:  Negative for chest pain and palpitations.  Gastrointestinal:  Negative for abdominal pain, blood in stool, constipation and diarrhea.  Endocrine: Negative for polydipsia and polyuria.  Genitourinary:  Negative for dysuria, frequency and urgency.  Musculoskeletal:  Negative for arthralgias, back pain and myalgias.  Skin:  Negative for pallor and rash.   Allergic/Immunologic: Negative for environmental allergies.  Neurological:  Negative for dizziness, seizures, syncope, facial asymmetry, speech difficulty and headaches.  Hematological:  Negative for adenopathy. Does not bruise/bleed easily.  Psychiatric/Behavioral:  Positive for decreased concentration. Negative for confusion and dysphoric mood. The patient is not nervous/anxious.        Objective:   Physical Exam Constitutional:      General: She is not in acute distress.    Appearance: Normal appearance. She is well-developed. She is obese. She is not ill-appearing or diaphoretic.  HENT:     Head: Normocephalic and atraumatic.     Right Ear: Tympanic membrane, ear canal and external ear normal.     Left Ear: Tympanic membrane, ear canal and external ear normal.     Nose: Nose normal. No congestion.     Mouth/Throat:     Mouth: Mucous membranes are moist.     Pharynx: Oropharynx is clear. No posterior oropharyngeal erythema.  Eyes:     General: No scleral icterus.    Extraocular Movements: Extraocular movements intact.     Conjunctiva/sclera: Conjunctivae normal.     Pupils: Pupils are equal, round, and reactive to light.  Neck:     Thyroid : No thyromegaly.     Vascular: No carotid bruit or JVD.  Cardiovascular:     Rate and Rhythm: Normal rate and regular rhythm.     Pulses: Normal pulses.     Heart sounds: Normal heart sounds.     No gallop.  Pulmonary:     Effort: Pulmonary effort is normal. No respiratory distress.     Breath sounds: Normal breath sounds. No wheezing or rales.     Comments: Good air exch Chest:     Chest wall: No tenderness.  Abdominal:     General: Bowel sounds are normal. There is no distension or abdominal bruit.     Palpations: Abdomen is soft. There  is no mass.     Tenderness: There is no abdominal tenderness.     Hernia: No hernia is present.  Genitourinary:    Comments: Breast exam: No mass, nodules, thickening, tenderness, bulging,  retraction, inflamation, nipple discharge or skin changes noted.  No axillary or clavicular LA.     Musculoskeletal:        General: No tenderness. Normal range of motion.     Cervical back: Normal range of motion and neck supple. No rigidity. No muscular tenderness.     Right lower leg: No edema.     Left lower leg: No edema.     Comments: No kyphosis   Lymphadenopathy:     Cervical: No cervical adenopathy.  Skin:    General: Skin is warm and dry.     Coloration: Skin is not pale.     Findings: No erythema or rash.     Comments: Solar lentigines diffusely Scattered tags and sks   Neurological:     Mental Status: She is alert. Mental status is at baseline.     Cranial Nerves: No cranial nerve deficit.     Motor: No abnormal muscle tone.     Coordination: Coordination normal.     Gait: Gait normal.     Deep Tendon Reflexes: Reflexes are normal and symmetric. Reflexes normal.  Psychiatric:        Mood and Affect: Mood normal.        Cognition and Memory: Cognition and memory normal.           Assessment & Plan:   Problem List Items Addressed This Visit       Cardiovascular and Mediastinum   Essential hypertension   bp in fair control at this time  BP Readings from Last 1 Encounters:  07/06/24 134/72  Up a bit due to exhaustion and stress  No changes needed Remains stable off of amlodipine   Will continue Carvedilol  12.5 mg bid Chlorthalidone  12.5 mg daily    Most recent labs reviewed  Lab today Disc lifstyle change with low sodium diet and exercise        Relevant Orders   Comprehensive metabolic panel with GFR   Lipid Panel   CBC with Differential/Platelet   TSH     Digestive   GERD (gastroesophageal reflux disease)   Now taking voquenza and doing much better  B12 level today      Relevant Medications   VOQUEZNA 20 MG TABS     Musculoskeletal and Integument   Osteopenia   dexa 01/2021-planning dexa at next gyn appointment soon   Osteopenia Taking GERD medication  On ca and D Walking  One fall No fractures  Discussed fall prevention, supplements and exercise for bone density          Other   Routine general medical examination at a health care facility - Primary   Reviewed health habits including diet and exercise and skin cancer prevention Reviewed appropriate screening tests for age  Also reviewed health mt list, fam hx and immunization status , as well as social and family history   See HPI Labs reviewed and ordered Health Maintenance  Topic Date Due   DTaP/Tdap/Td vaccine (2 - Tdap) 01/13/2023   COVID-19 Vaccine (7 - 2025-26 season) 05/12/2025*   Breast Cancer Screening  09/10/2024   Medicare Annual Wellness Visit  02/04/2025   Pneumococcal Vaccine for age over 43  Completed   Flu Shot  Completed   Osteoporosis screening with Bone Density  Scan  Completed   Zoster (Shingles) Vaccine  Completed   Meningitis B Vaccine  Aged Out  *Topic was postponed. The date shown is not the original due date.    Discussed fall prevention, supplements and exercise for bone density  Has dexa and mammo planned at gyn office  Plan made for exercise  PHQ 0 Some exhaustion from care giving- will follow up when back to usual routine if want to discuss cognition         Prediabetes   A1c ordered disc imp of low glycemic diet and wt loss to prevent DM2       Relevant Orders   Hemoglobin A1c   Non-artifactual high serum prolactin   PL level today  Continues bromocriptine  2.5 mg daily  No clinical changes       Relevant Orders   Prolactin   Leg weakness, bilateral   Walking more slowly but still active Given handicapped parking form      Hyperlipidemia   Disc goals for lipids and reasons to control them Rev last labs with pt Rev low sat fat diet in detail  Lab today Continues crestor  10 mg daily      Relevant Orders   Comprehensive metabolic panel with GFR   Lipid Panel   Current use of  proton pump inhibitor   Now using voquenza This can also dec abs of B12 Level ordered       Relevant Orders   Vitamin B12      [1]  Social History Tobacco Use   Smoking status: Never   Smokeless tobacco: Never  Vaping Use   Vaping status: Never Used  Substance Use Topics   Alcohol use: Never   Drug use: Never  [2]  Allergies Allergen Reactions   Ace Inhibitors Swelling    THROAT SWELLING   Requip [Ropinirole] Other (See Comments)    Vision change    Shrimp [Shellfish Allergy] Nausea And Vomiting   Aleve [Naproxen] Rash  [3]  Current Outpatient Medications on File Prior to Visit  Medication Sig Dispense Refill   amoxicillin  (AMOXIL ) 500 MG capsule Take 2,000 mg by mouth See admin instructions. Take 2000 mg by mouth 1 hour prior to dental appointments     Ascorbic Acid (VITAMIN C PO) Take 1 tablet by mouth in the morning and at bedtime.     bromocriptine  (PARLODEL ) 2.5 MG tablet Take 1 tablet (2.5 mg total) by mouth daily. 90 tablet 3   Calcium  Carb-Cholecalciferol (CALCIUM  + D3 PO) Take 1 tablet by mouth in the morning.     carvedilol  (COREG ) 12.5 MG tablet TAKE 1 TABLET TWICE A DAY  WITH MEALS 180 tablet 0   chlorthalidone  (HYGROTON ) 25 MG tablet TAKE ONE-HALF (1/2) TABLET DAILY 45 tablet 0   cholecalciferol (VITAMIN D3) 25 MCG (1000 UNIT) tablet Take 1,000 Units by mouth in the morning.     Coenzyme Q10 (COQ10 PO) Take 1 capsule by mouth 2 (two) times a week.     CRANBERRY-VITAMIN C-D MANNOSE PO Take 2 capsules by mouth in the morning and at bedtime.     estradiol  (ESTRACE  VAGINAL) 0.1 MG/GM vaginal cream Pea sized amount to urethra/vulvar area twice weekly 42.5 g 1   fluticasone  (FLONASE ) 50 MCG/ACT nasal spray Place 2 sprays into both nostrils daily. (Patient taking differently: Place 2 sprays into both nostrils every evening.) 48 g 3   ibuprofen  (ADVIL ) 200 MG tablet Take 400-600 mg by mouth in the morning and at bedtime.  levocetirizine (XYZAL) 5 MG tablet Take 5  mg by mouth every evening.     Magnesium 250 MG CAPS Take 250 mg by mouth at bedtime.     methenamine (HIPREX) 1 g tablet Take 1 g by mouth 2 (two) times daily.     Multiple Vitamin (MULTIVITAMIN WITH MINERALS) TABS tablet Take 1 tablet by mouth in the morning.     Multiple Vitamins-Minerals (PRESERVISION AREDS 2 PO) Take 2 capsules by mouth in the morning.     nystatin  (MYCOSTATIN /NYSTOP ) powder Apply 1 Application topically 3 (three) times daily. To affected area 30 g 1   Polyethyl Glycol-Propyl Glycol (SYSTANE OP) Place 1 drop into both eyes in the morning and at bedtime.     polyethylene glycol powder (GLYCOLAX /MIRALAX ) 17 GM/SCOOP powder Take 8.5 g by mouth in the morning.     rosuvastatin  (CRESTOR ) 10 MG tablet TAKE 1 TABLET AT BEDTIME 90 tablet 0   senna (SENOKOT) 8.6 MG TABS tablet Take 1 tablet by mouth at bedtime.     TURMERIC PO Take 1 capsule by mouth 2 (two) times a week.     VOQUEZNA 20 MG TABS Take 1 tablet by mouth daily.     No current facility-administered medications on file prior to visit.   "

## 2024-07-06 NOTE — Assessment & Plan Note (Signed)
 dexa 01/2021-planning dexa at next gyn appointment soon  Osteopenia Taking GERD medication  On ca and D Walking  One fall No fractures  Discussed fall prevention, supplements and exercise for bone density

## 2024-07-06 NOTE — Assessment & Plan Note (Addendum)
 bp in fair control at this time  BP Readings from Last 1 Encounters:  07/06/24 134/72  Up a bit due to exhaustion and stress  No changes needed Remains stable off of amlodipine   Will continue Carvedilol  12.5 mg bid Chlorthalidone  12.5 mg daily    Most recent labs reviewed  Lab today Disc lifstyle change with low sodium diet and exercise

## 2024-07-06 NOTE — Patient Instructions (Addendum)
 Take care of yourself   Lab today   Get some rest  Get back to some exercise when you are ready   See how you feel in a month   Come back if you are concerned about memory and cognition

## 2024-07-06 NOTE — Assessment & Plan Note (Signed)
 Disc goals for lipids and reasons to control them Rev last labs with pt Rev low sat fat diet in detail  Lab today Continues crestor  10 mg daily

## 2024-07-06 NOTE — Assessment & Plan Note (Signed)
 Now using voquenza This can also dec abs of B12 Level ordered

## 2024-07-06 NOTE — Assessment & Plan Note (Signed)
 A1c ordered   disc imp of low glycemic diet and wt loss to prevent DM2

## 2024-07-06 NOTE — Assessment & Plan Note (Signed)
 PL level today  Continues bromocriptine  2.5 mg daily  No clinical changes

## 2024-07-07 ENCOUNTER — Ambulatory Visit: Payer: Self-pay | Admitting: Family Medicine

## 2024-07-07 LAB — PROLACTIN: Prolactin: 6.4 ng/mL

## 2024-07-07 MED ORDER — POTASSIUM CHLORIDE ER 10 MEQ PO TBCR: 10.0000 meq | EXTENDED_RELEASE_TABLET | Freq: Two times a day (BID) | ORAL | 0 refills | Status: DC

## 2024-07-18 ENCOUNTER — Other Ambulatory Visit: Payer: Self-pay | Admitting: Family Medicine

## 2024-07-19 ENCOUNTER — Telehealth: Payer: Self-pay

## 2024-07-19 DIAGNOSIS — E876 Hypokalemia: Secondary | ICD-10-CM

## 2024-07-19 NOTE — Telephone Encounter (Signed)
 Copied from CRM [id number]. Topic: Clinical - Lab/Test Results >> Jul 19, 2024  1:59 PM Frederich PARAS wrote: Reason for CRM: pt calling in to schedule appt for potassium as directed by dr laine tower, I did not see any lab orders . Please put in lab order for potassium for pt to be scheduled . Pt is going to [place]  next week and would liek to take care of it this week

## 2024-07-19 NOTE — Addendum Note (Signed)
 Addended by: BRIEN SONG A on: 07/19/2024 02:46 PM   Modules accepted: Orders

## 2024-07-21 ENCOUNTER — Ambulatory Visit: Payer: Self-pay | Admitting: Family Medicine

## 2024-07-21 ENCOUNTER — Other Ambulatory Visit (INDEPENDENT_AMBULATORY_CARE_PROVIDER_SITE_OTHER)

## 2024-07-21 DIAGNOSIS — E876 Hypokalemia: Secondary | ICD-10-CM | POA: Diagnosis not present

## 2024-07-21 LAB — POTASSIUM: Potassium: 3.2 meq/L — ABNORMAL LOW (ref 3.5–5.1)

## 2024-07-22 MED ORDER — POTASSIUM CHLORIDE CRYS ER 20 MEQ PO TBCR: 20.0000 meq | EXTENDED_RELEASE_TABLET | Freq: Two times a day (BID) | ORAL | 0 refills | Status: DC

## 2024-07-29 ENCOUNTER — Encounter: Payer: Self-pay | Admitting: Cardiovascular Disease

## 2024-07-29 ENCOUNTER — Encounter: Payer: Self-pay | Admitting: Family Medicine

## 2024-07-29 ENCOUNTER — Ambulatory Visit: Attending: Cardiovascular Disease | Admitting: Cardiovascular Disease

## 2024-07-29 VITALS — BP 134/78 | HR 71 | Ht 64.5 in | Wt 181.8 lb

## 2024-07-29 DIAGNOSIS — E782 Mixed hyperlipidemia: Secondary | ICD-10-CM | POA: Diagnosis not present

## 2024-07-29 DIAGNOSIS — R0609 Other forms of dyspnea: Secondary | ICD-10-CM | POA: Diagnosis not present

## 2024-07-29 DIAGNOSIS — R55 Syncope and collapse: Secondary | ICD-10-CM | POA: Diagnosis not present

## 2024-07-29 DIAGNOSIS — E876 Hypokalemia: Secondary | ICD-10-CM

## 2024-07-29 DIAGNOSIS — I10 Essential (primary) hypertension: Secondary | ICD-10-CM

## 2024-07-29 NOTE — Assessment & Plan Note (Addendum)
 Treated with 10 mg of rosuvastatin .  Lipids followed by Dr. Randeen.  Overall the patient appears clinically stable from a cardiac perspective.  I will plan to see her back in 1 year.

## 2024-07-29 NOTE — Assessment & Plan Note (Addendum)
 Blood pressure is well-controlled.  Continue carvedilol  and chlorthalidone .

## 2024-07-29 NOTE — Telephone Encounter (Signed)
 Copied from CRM [id number]. Topic: Clinical - Request for Lab/Test Order >> Jul 29, 2024  4:24 PM Joesph NOVAK wrote: Reason for CRM: patient would like to schedule lab appt,

## 2024-07-29 NOTE — Patient Instructions (Signed)

## 2024-07-29 NOTE — Progress Notes (Signed)
 " Cardiology Office Note:    Date:  07/29/2024   ID:  Nicole Suarez, DOB 1940-09-11, MRN [id number]  PCP:  Randeen Laine LABOR, MD   [facility] Providers Cardiologist:  None     Referring MD: Randeen Laine LABOR, MD   Chief Complaint  Patient presents with   Shortness of Breath    History of Present Illness:    Nicole Suarez is a 84 y.o. female presenting for follow-up evaluation.  The patient was last seen in May 2024 for evaluation of fatigue and exertional dyspnea.  She had undergone previous cardiac imaging studies in 2019 demonstrating normal LVEF of 55% with no valvular disease and a nuclear scan that showed no evidence of ischemia.  An updated echocardiogram was performed in 2024 after her visit and this demonstrated LVEF 60 to 65% with no regional wall motion abnormalities, grade 1 diastolic dysfunction, normal RV function, and aortic sclerosis without stenosis.  The patient is here alone today.  She has continued to be under a good bit of stress after her husband's death a few years ago and she was just recently in [place]  for 5 months taking care of her sister who has cancer.  She has been back since before the holidays.  She states that she is doing okay and mostly limited by problems of osteoarthritis, specifically her right knee is the worst joint.  She continues to have mild exertional dyspnea which has not changed since I saw her a few years ago.  She has fatigue and exercise intolerance.  She denies chest pain, chest pressure, orthopnea, or PND.  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   Hypertension ?   Neuromuscular disorder (HCC)    restless legs    PONV (postoperative nausea and vomiting)    Past Surgical History:  Procedure Laterality Date   APPENDECTOMY  1963   CARPAL TUNNEL RELEASE Bilateral    dequivan release   CATARACT EXTRACTION, BILATERAL     08/2019, 09/2019   CESAREAN SECTION  1964   EYE SURGERY  2022   Cataract   JOINT REPLACEMENT  2025   Rt Shoulder replacement   MICRODISCECTOMY LUMBAR  2019   L3L4   RECTOCELE REPAIR  2009   REMOVAL OF URINARY SLING     REPLACEMENT TOTAL KNEE  2010   REVERSE SHOULDER ARTHROPLASTY Left 01/31/2022   Procedure: REVERSE SHOULDER ARTHROPLASTY;  Surgeon: Melita Drivers, MD;  Location: [facility];  Service: Orthopedics;  Laterality: Left;   REVERSE SHOULDER ARTHROPLASTY Right 11/13/2023   Procedure: ARTHROPLASTY, SHOULDER, TOTAL, REVERSE;  Surgeon: Melita Drivers, MD;  Location: [facility];  Service: Orthopedics;  Laterality: Right;  exparel    ROTATOR CUFF REPAIR  2000   SPINE SURGERY  2019   TUBAL LIGATION  1974   urterine suspension  1971    Current Medications: Active Medications[1]   Allergies:  Ace inhibitors, Requip [ropinirole], Shrimp [shellfish allergy], and Aleve [naproxen]   ROS:   Please see the history of present illness.    All other systems reviewed and are negative.  EKGs/Labs/Other Studies Reviewed:    The following studies were reviewed today: Cardiac Studies & Procedures   ______________________________________________________________________________________________   STRESS TESTS  MYOCARDIAL PERFUSION IMAGING 10/17/2017   ECHOCARDIOGRAM  ECHOCARDIOGRAM COMPLETE 12/19/2022  Narrative ECHOCARDIOGRAM REPORT    Patient Name:   Nicole Suarez  Date of Exam: 12/19/2022 Medical Rec #:  [id number]     Height:       64.5 in Accession #:    [id number]    Weight:       174.2 lb Date of Birth:   [birth year]     BSA:          1.855 m Patient Age:    81 years      BP:           122/68 mmHg Patient Gender: F             HR:           70 bpm. Exam Location:  [address]  Procedure: 2D Echo, 3D Echo, Cardiac Doppler and Color Doppler  Indications:    R55 Syncope  History:        Patient has prior history of Echocardiogram examinations, most recent 10/16/2017. Signs/Symptoms:Syncope, Dizziness/Lightheadedness, Shortness of Breath and Dyspnea; Risk Factors:Family History of Coronary Artery Disease, Hypertension and Dyslipidemia.  Sonographer:    Heather Hawks RDCS Referring Phys: MARNE A TOWER  IMPRESSIONS   1. Left ventricular ejection fraction, by estimation, is 60 to 65%. Left ventricular ejection fraction by 3D volume is 60 %. The left ventricle has normal function. The left ventricle has no regional wall motion abnormalities. Left ventricular diastolic parameters are consistent with Grade I diastolic dysfunction (impaired relaxation). Elevated left ventricular end-diastolic pressure. The E/e' is 16. 2. Right ventricular systolic function is low normal. The right ventricular size is normal. There is normal pulmonary artery systolic pressure. The estimated right ventricular systolic pressure is 28.8 mmHg. 3. The mitral valve is abnormal. Trivial mitral valve regurgitation. Moderate mitral annular calcification. 4. The aortic valve is tricuspid. Aortic valve regurgitation is not visualized. Aortic valve sclerosis/calcification is present, without any evidence of aortic stenosis. Aortic valve mean gradient measures 7.3 mmHg. 5. The inferior vena cava is normal in size with greater than 50% respiratory variability, suggesting right atrial pressure of 3 mmHg.  FINDINGS Left Ventricle: Left ventricular ejection fraction, by estimation, is 60 to 65%. Left ventricular ejection fraction by 3D volume is 60 %. The left ventricle has normal function. The left ventricle has no regional wall  motion abnormalities. The left ventricular internal cavity size was normal in size. There is no left ventricular hypertrophy. Left ventricular diastolic parameters are consistent with Grade I diastolic dysfunction (impaired relaxation). Elevated left ventricular end-diastolic pressure. The E/e' is 16.  Right Ventricle: The right ventricular size is normal. No increase in right ventricular wall thickness. Right ventricular systolic function is low normal. There is normal pulmonary artery systolic pressure. The tricuspid regurgitant velocity is 2.54 m/s, and with an assumed right atrial pressure of 3 mmHg, the estimated right ventricular systolic pressure is 28.8 mmHg.  Left Atrium: Left atrial size was normal in size.  Right Atrium: Right atrial size was normal in size.  Pericardium: There is no evidence of pericardial effusion.  Mitral Valve: The mitral valve is abnormal. Moderate mitral  annular calcification. Trivial mitral valve regurgitation.  Tricuspid Valve: The tricuspid valve is grossly normal. Tricuspid valve regurgitation is mild.  Aortic Valve: The aortic valve is tricuspid. Aortic valve regurgitation is not visualized. Aortic valve sclerosis/calcification is present, without any evidence of aortic stenosis. Aortic valve mean gradient measures 7.3 mmHg. Aortic valve peak gradient measures 12.5 mmHg. Aortic valve area, by VTI measures 1.83 cm.  Pulmonic Valve: The pulmonic valve was normal in structure. Pulmonic valve regurgitation is not visualized.  Aorta: The aortic root and ascending aorta are structurally normal, with no evidence of dilitation.  Venous: The inferior vena cava is normal in size with greater than 50% respiratory variability, suggesting right atrial pressure of 3 mmHg.  IAS/Shunts: No atrial level shunt detected by color flow Doppler.   LEFT VENTRICLE PLAX 2D LVIDd:         3.70 cm         Diastology LVIDs:         2.30 cm         LV e' medial:    5.98  cm/s LV PW:         0.90 cm         LV E/e' medial:  15.6 LV IVS:        0.90 cm         LV e' lateral:   5.33 cm/s LVOT diam:     2.10 cm         LV E/e' lateral: 17.5 LV SV:         75 LV SV Index:   40 LVOT Area:     3.46 cm        3D Volume EF LV 3D EF:    Left ventricul ar ejection fraction by 3D volume is 60 %.  3D Volume EF: 3D EF:        60 % LV EDV:       106 ml LV ESV:       42 ml LV SV:        63 ml  RIGHT VENTRICLE RV Basal diam:  3.70 cm RV S prime:     10.70 cm/s TAPSE (M-mode): 2.2 cm RVSP:           28.8 mmHg  LEFT ATRIUM             Index        RIGHT ATRIUM           Index LA diam:        4.00 cm 2.16 cm/m   RA Pressure: 3.00 mmHg LA Vol (A2C):   47.9 ml 25.83 ml/m  RA Area:     16.70 cm LA Vol (A4C):   51.3 ml 27.66 ml/m  RA Volume:   43.60 ml  23.51 ml/m LA Biplane Vol: 50.8 ml 27.39 ml/m AORTIC VALVE AV Area (Vmax):    1.64 cm AV Area (Vmean):   1.62 cm AV Area (VTI):     1.83 cm AV Vmax:           177.00 cm/s AV Vmean:          122.000 cm/s AV VTI:            0.407 m AV Peak Grad:      12.5 mmHg AV Mean Grad:      7.3 mmHg LVOT Vmax:         83.85 cm/s LVOT Vmean:        57.100  cm/s LVOT VTI:          0.216 m LVOT/AV VTI ratio: 0.53  AORTA Ao Root diam: 2.70 cm Ao Asc diam:  3.30 cm  MITRAL VALVE                TRICUSPID VALVE MV Area (PHT): cm          TR Peak grad:   25.8 mmHg MV Decel Time: 326 msec     TR Vmax:        254.00 cm/s MV E velocity: 93.25 cm/s   Estimated RAP:  3.00 mmHg MV A velocity: 139.00 cm/s  RVSP:           28.8 mmHg MV E/A ratio:  0.67 SHUNTS Systemic VTI:  0.22 m Systemic Diam: 2.10 cm  Vinie Maxcy MD Electronically signed by Vinie Maxcy MD Signature Date/Time: 12/19/2022/10:38:26 PM    Final          ______________________________________________________________________________________________      EKG:   EKG Interpretation Date/Time:  Thursday July 29 2024 08:54:13  EST Ventricular Rate:  71 PR Interval:  168 QRS Duration:  98 QT Interval:  400 QTC Calculation: 434 R Axis:   -26  Text Interpretation: Normal sinus rhythm Moderate voltage criteria for LVH, may be normal variant ( R in aVL , Cornell product ) Nonspecific ST abnormality No previous ECGs available Confirmed by Wonda Sharper [phone]) on 07/29/2024 8:58:09 AM    Recent Labs: 07/06/2024: ALT 20; BUN 18; Creatinine, Ser 0.68; Hemoglobin 14.4; Platelets 252.0; Sodium 138; TSH 1.28 07/21/2024: Potassium 3.2  Recent Lipid Panel    Component Value Date/Time   CHOL 146 07/06/2024 1055   TRIG 107.0 07/06/2024 1055   HDL 58.60 07/06/2024 1055   CHOLHDL 2 07/06/2024 1055   VLDL 21.4 07/06/2024 1055   LDLCALC 66 07/06/2024 1055     Risk Assessment/Calculations:                Physical Exam:    VS:  BP 134/78 (BP Location: Left Arm, Patient Position: Sitting, Cuff Size: Large)   Pulse 71   Ht 5' 4.5 (1.638 m)   Wt 181 lb 12.8 oz (82.5 kg)   SpO2 98%   BMI 30.72 kg/m     Wt Readings from Last 3 Encounters:  07/29/24 181 lb 12.8 oz (82.5 kg)  07/06/24 177 lb 6 oz (80.5 kg)  02/05/24 186 lb (84.4 kg)     GEN:  Well nourished, well developed in no acute distress HEENT: Normal NECK: No JVD; No carotid bruits LYMPHATICS: No lymphadenopathy CARDIAC: RRR, no murmurs, rubs, gallops RESPIRATORY:  Clear to auscultation without rales, wheezing or rhonchi  ABDOMEN: Soft, non-tender, non-distended MUSCULOSKELETAL:  No edema; No deformity  SKIN: Warm and dry NEUROLOGIC:  Alert and oriented x 3 PSYCHIATRIC:  Normal affect   Assessment & Plan Exertional dyspnea Suspect deconditioning is the major issue.  No cardiac limitation identified.  Reviewed most recent echo. Near syncope Patient has had a few episodes of lightheadedness but no recent problems.  These have primarily occurred when she is turning her head a certain direction. Essential hypertension Blood pressure is  well-controlled.  Continue carvedilol  and chlorthalidone . Mixed hyperlipidemia Treated with 10 mg of rosuvastatin .  Lipids followed by Dr. Randeen.  Overall the patient appears clinically stable from a cardiac perspective.  I will plan to see her back in 1 year.            Medication Adjustments/Labs and Tests  Ordered: Current medicines are reviewed at length with the patient today.  Concerns regarding medicines are outlined above.  Orders Placed This Encounter  Procedures   EKG 12-Lead   No orders of the defined types were placed in this encounter.   Patient Instructions  Medication Instructions:  No medication changes were made at this visit. Continue current regimen.   *If you need a refill on your cardiac medications before your next appointment, please call your pharmacy*  Lab Work: None ordered today. If you have labs (blood work) drawn today and your tests are completely normal, you will receive your results only by: MyChart Message (if you have MyChart) OR A paper copy in the mail If you have any lab test that is abnormal or we need to change your treatment, we will call you to review the results.  Testing/Procedures: None ordered today.  Follow-Up: At [facility], you and your health needs are our priority.  As part of our continuing mission to provide you with exceptional heart care, our providers are all part of one team.  This team includes your primary Cardiologist (physician) and Advanced Practice Providers or APPs (Physician Assistants and Nurse Practitioners) who all work together to provide you with the care you need, when you need it.  Your next appointment:   1 year(s)  Provider:   Ozell Fell, MD     Signed, Ozell Fell, MD  07/29/2024 9:58 AM    [facility]     [1]  Current Meds  Medication Sig   amoxicillin  (AMOXIL ) 500 MG capsule Take 2,000 mg by mouth See admin instructions. Take 2000 mg by mouth 1 hour prior to  dental appointments   Ascorbic Acid (VITAMIN C PO) Take 1 tablet by mouth in the morning and at bedtime.   bromocriptine  (PARLODEL ) 2.5 MG tablet Take 1 tablet (2.5 mg total) by mouth daily.   Calcium  Carb-Cholecalciferol (CALCIUM  + D3 PO) Take 1 tablet by mouth in the morning.   carvedilol  (COREG ) 12.5 MG tablet TAKE 1 TABLET TWICE A DAY  WITH MEALS   chlorthalidone  (HYGROTON ) 25 MG tablet TAKE ONE-HALF (1/2) TABLET DAILY   cholecalciferol (VITAMIN D3) 25 MCG (1000 UNIT) tablet Take 1,000 Units by mouth in the morning.   Coenzyme Q10 (COQ10 PO) Take 1 capsule by mouth 2 (two) times a week.   CRANBERRY-VITAMIN C-D MANNOSE PO Take 2 capsules by mouth in the morning and at bedtime.   estradiol  (ESTRACE  VAGINAL) 0.1 MG/GM vaginal cream Pea sized amount to urethra/vulvar area twice weekly   fluticasone  (FLONASE ) 50 MCG/ACT nasal spray Place 2 sprays into both nostrils daily. (Patient taking differently: Place 2 sprays into both nostrils every evening.)   ibuprofen  (ADVIL ) 200 MG tablet Take 400-600 mg by mouth in the morning and at bedtime.   levocetirizine (XYZAL) 5 MG tablet Take 5 mg by mouth every evening.   Magnesium 250 MG CAPS Take 250 mg by mouth at bedtime.   methenamine (HIPREX) 1 g tablet Take 1 g by mouth 2 (two) times daily.   Multiple Vitamin (MULTIVITAMIN WITH MINERALS) TABS tablet Take 1 tablet by mouth in the morning.   Multiple Vitamins-Minerals (PRESERVISION AREDS 2 PO) Take 2 capsules by mouth in the morning.   nystatin  (MYCOSTATIN /NYSTOP ) powder Apply 1 Application topically 3 (three) times daily. To affected area   Polyethyl Glycol-Propyl Glycol (SYSTANE OP) Place 1 drop into both eyes in the morning and at bedtime.   polyethylene glycol powder (GLYCOLAX /MIRALAX ) 17 GM/SCOOP  powder Take 8.5 g by mouth in the morning.   potassium chloride  SA (KLOR-CON  M) 20 MEQ tablet Take 1 tablet (20 mEq total) by mouth 2 (two) times daily.   rosuvastatin  (CRESTOR ) 10 MG tablet TAKE 1 TABLET  AT BEDTIME   senna (SENOKOT) 8.6 MG TABS tablet Take 1 tablet by mouth at bedtime.   TURMERIC PO Take 1 capsule by mouth 2 (two) times a week.   VOQUEZNA 20 MG TABS Take 1 tablet by mouth daily.   "

## 2024-08-03 ENCOUNTER — Other Ambulatory Visit (INDEPENDENT_AMBULATORY_CARE_PROVIDER_SITE_OTHER)

## 2024-08-03 ENCOUNTER — Ambulatory Visit: Payer: Self-pay | Admitting: Family Medicine

## 2024-08-03 DIAGNOSIS — E876 Hypokalemia: Secondary | ICD-10-CM | POA: Diagnosis not present

## 2024-08-03 LAB — BASIC METABOLIC PANEL WITH GFR
BUN: 23 mg/dL (ref 6–23)
CO2: 31 meq/L (ref 19–32)
Calcium: 9.4 mg/dL (ref 8.4–10.5)
Chloride: 103 meq/L (ref 96–112)
Creatinine, Ser: 0.75 mg/dL (ref 0.40–1.20)
GFR: 74.72 mL/min
Glucose, Bld: 87 mg/dL (ref 70–99)
Potassium: 3.7 meq/L (ref 3.5–5.1)
Sodium: 140 meq/L (ref 135–145)

## 2024-08-03 MED ORDER — POTASSIUM CHLORIDE CRYS ER 20 MEQ PO TBCR: 20.0000 meq | EXTENDED_RELEASE_TABLET | Freq: Two times a day (BID) | ORAL | 1 refills | Status: DC

## 2024-08-18 ENCOUNTER — Other Ambulatory Visit: Payer: Self-pay | Admitting: *Deleted

## 2024-08-18 MED ORDER — POTASSIUM CHLORIDE CRYS ER 20 MEQ PO TBCR: 20.0000 meq | EXTENDED_RELEASE_TABLET | Freq: Two times a day (BID) | ORAL | 1 refills | Status: AC

## 2024-11-04 ENCOUNTER — Ambulatory Visit: Admitting: Dermatology

## 2025-02-08 ENCOUNTER — Ambulatory Visit
